# Patient Record
Sex: Female | Born: 1985 | Race: White | Hispanic: No | Marital: Married | Smoking: Never smoker
Health system: Southern US, Community
[De-identification: ages and names within clinical notes are randomized; demographics above are authoritative.]

## PROBLEM LIST (undated history)

## (undated) ENCOUNTER — Ambulatory Visit: Admission: EM | Payer: PRIVATE HEALTH INSURANCE | Source: Home / Self Care

## (undated) DIAGNOSIS — E669 Obesity, unspecified: Secondary | ICD-10-CM

## (undated) DIAGNOSIS — N939 Abnormal uterine and vaginal bleeding, unspecified: Secondary | ICD-10-CM

## (undated) DIAGNOSIS — R000 Tachycardia, unspecified: Secondary | ICD-10-CM

## (undated) DIAGNOSIS — T8859XA Other complications of anesthesia, initial encounter: Secondary | ICD-10-CM

## (undated) DIAGNOSIS — Z975 Presence of (intrauterine) contraceptive device: Secondary | ICD-10-CM

## (undated) DIAGNOSIS — R079 Chest pain, unspecified: Secondary | ICD-10-CM

## (undated) DIAGNOSIS — T4145XA Adverse effect of unspecified anesthetic, initial encounter: Secondary | ICD-10-CM

## (undated) DIAGNOSIS — N6019 Diffuse cystic mastopathy of unspecified breast: Secondary | ICD-10-CM

## (undated) DIAGNOSIS — Z30017 Encounter for initial prescription of implantable subdermal contraceptive: Secondary | ICD-10-CM

## (undated) DIAGNOSIS — F32A Depression, unspecified: Secondary | ICD-10-CM

## (undated) DIAGNOSIS — I10 Essential (primary) hypertension: Secondary | ICD-10-CM

## (undated) DIAGNOSIS — E0590 Thyrotoxicosis, unspecified without thyrotoxic crisis or storm: Secondary | ICD-10-CM

## (undated) DIAGNOSIS — N390 Urinary tract infection, site not specified: Secondary | ICD-10-CM

## (undated) DIAGNOSIS — G932 Benign intracranial hypertension: Secondary | ICD-10-CM

## (undated) DIAGNOSIS — I499 Cardiac arrhythmia, unspecified: Secondary | ICD-10-CM

## (undated) DIAGNOSIS — Z8489 Family history of other specified conditions: Secondary | ICD-10-CM

## (undated) DIAGNOSIS — G43909 Migraine, unspecified, not intractable, without status migrainosus: Secondary | ICD-10-CM

## (undated) DIAGNOSIS — J302 Other seasonal allergic rhinitis: Secondary | ICD-10-CM

## (undated) DIAGNOSIS — F329 Major depressive disorder, single episode, unspecified: Secondary | ICD-10-CM

## (undated) HISTORY — DX: Encounter for initial prescription of implantable subdermal contraceptive: Z30.017

## (undated) HISTORY — DX: Abnormal uterine and vaginal bleeding, unspecified: N93.9

## (undated) HISTORY — DX: Benign intracranial hypertension: G93.2

## (undated) HISTORY — PX: TONSILLECTOMY: SUR1361

## (undated) HISTORY — PX: CHOLECYSTECTOMY: SHX55

## (undated) HISTORY — DX: Migraine, unspecified, not intractable, without status migrainosus: G43.909

## (undated) HISTORY — DX: Presence of (intrauterine) contraceptive device: Z97.5

---
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
## 2006-04-18 ENCOUNTER — Emergency Department (HOSPITAL_COMMUNITY): Admission: EM | Admit: 2006-04-18 | Discharge: 2006-04-18 | Payer: Self-pay | Admitting: Emergency Medicine

## 2006-09-19 ENCOUNTER — Inpatient Hospital Stay (HOSPITAL_COMMUNITY): Admission: AD | Admit: 2006-09-19 | Discharge: 2006-09-23 | Payer: Self-pay | Admitting: Obstetrics and Gynecology

## 2006-10-07 ENCOUNTER — Inpatient Hospital Stay (HOSPITAL_COMMUNITY): Admission: AD | Admit: 2006-10-07 | Discharge: 2006-10-07 | Payer: Self-pay | Admitting: Obstetrics and Gynecology

## 2006-10-07 ENCOUNTER — Ambulatory Visit: Payer: Self-pay | Admitting: Obstetrics and Gynecology

## 2006-10-20 ENCOUNTER — Observation Stay (HOSPITAL_COMMUNITY): Admission: AD | Admit: 2006-10-20 | Discharge: 2006-10-21 | Payer: Self-pay | Admitting: Obstetrics and Gynecology

## 2006-10-26 ENCOUNTER — Ambulatory Visit: Payer: Self-pay | Admitting: Obstetrics and Gynecology

## 2006-10-26 ENCOUNTER — Inpatient Hospital Stay (HOSPITAL_COMMUNITY): Admission: AD | Admit: 2006-10-26 | Discharge: 2006-10-26 | Payer: Self-pay | Admitting: Obstetrics and Gynecology

## 2006-10-27 ENCOUNTER — Ambulatory Visit: Payer: Self-pay | Admitting: Family Medicine

## 2006-10-27 ENCOUNTER — Inpatient Hospital Stay (HOSPITAL_COMMUNITY): Admission: AD | Admit: 2006-10-27 | Discharge: 2006-10-30 | Payer: Self-pay | Admitting: Obstetrics and Gynecology

## 2006-11-15 ENCOUNTER — Emergency Department (HOSPITAL_COMMUNITY): Admission: EM | Admit: 2006-11-15 | Discharge: 2006-11-15 | Payer: Self-pay | Admitting: Emergency Medicine

## 2006-11-16 ENCOUNTER — Ambulatory Visit: Payer: Self-pay | Admitting: Internal Medicine

## 2006-11-16 ENCOUNTER — Inpatient Hospital Stay (HOSPITAL_COMMUNITY): Admission: EM | Admit: 2006-11-16 | Discharge: 2006-11-21 | Payer: Self-pay | Admitting: Emergency Medicine

## 2006-11-20 ENCOUNTER — Encounter (INDEPENDENT_AMBULATORY_CARE_PROVIDER_SITE_OTHER): Payer: Self-pay | Admitting: General Surgery

## 2007-01-08 ENCOUNTER — Ambulatory Visit: Payer: Self-pay | Admitting: Family Medicine

## 2007-04-05 ENCOUNTER — Encounter: Payer: Self-pay | Admitting: Family Medicine

## 2007-04-18 ENCOUNTER — Emergency Department (HOSPITAL_COMMUNITY): Admission: EM | Admit: 2007-04-18 | Discharge: 2007-04-18 | Payer: Self-pay | Admitting: Emergency Medicine

## 2007-05-09 ENCOUNTER — Ambulatory Visit: Payer: Self-pay | Admitting: Family Medicine

## 2007-05-09 LAB — CONVERTED CEMR LAB
BUN: 14 mg/dL (ref 6–23)
Basophils Absolute: 0 10*3/uL (ref 0.0–0.1)
Basophils Relative: 0 % (ref 0–1)
Calcium: 10 mg/dL (ref 8.4–10.5)
Cholesterol: 130 mg/dL (ref 0–200)
Eosinophils Absolute: 0.1 10*3/uL (ref 0.0–0.7)
Eosinophils Relative: 2 % (ref 0–5)
Hemoglobin: 13.2 g/dL (ref 12.0–15.0)
LDL Cholesterol: 74 mg/dL (ref 0–99)
Monocytes Absolute: 0.5 10*3/uL (ref 0.1–1.0)
Neutro Abs: 4.2 10*3/uL (ref 1.7–7.7)
Neutrophils Relative %: 54 % (ref 43–77)
Platelets: 292 10*3/uL (ref 150–400)
TSH: 1.363 microintl units/mL (ref 0.350–5.50)
Total CHOL/HDL Ratio: 3

## 2007-05-23 ENCOUNTER — Ambulatory Visit (HOSPITAL_COMMUNITY): Admission: RE | Admit: 2007-05-23 | Discharge: 2007-05-23 | Payer: Self-pay | Admitting: Family Medicine

## 2007-08-05 ENCOUNTER — Emergency Department (HOSPITAL_COMMUNITY): Admission: EM | Admit: 2007-08-05 | Discharge: 2007-08-05 | Payer: Self-pay | Admitting: Emergency Medicine

## 2007-09-11 ENCOUNTER — Emergency Department (HOSPITAL_COMMUNITY): Admission: EM | Admit: 2007-09-11 | Discharge: 2007-09-11 | Payer: Self-pay | Admitting: Emergency Medicine

## 2007-10-30 ENCOUNTER — Telehealth: Payer: Self-pay | Admitting: Family Medicine

## 2007-10-31 DIAGNOSIS — F329 Major depressive disorder, single episode, unspecified: Secondary | ICD-10-CM

## 2007-10-31 DIAGNOSIS — E669 Obesity, unspecified: Secondary | ICD-10-CM

## 2007-10-31 DIAGNOSIS — N6019 Diffuse cystic mastopathy of unspecified breast: Secondary | ICD-10-CM

## 2007-10-31 DIAGNOSIS — J301 Allergic rhinitis due to pollen: Secondary | ICD-10-CM

## 2007-10-31 DIAGNOSIS — F3289 Other specified depressive episodes: Secondary | ICD-10-CM | POA: Insufficient documentation

## 2007-11-06 ENCOUNTER — Emergency Department (HOSPITAL_COMMUNITY): Admission: EM | Admit: 2007-11-06 | Discharge: 2007-11-06 | Payer: Self-pay | Admitting: Emergency Medicine

## 2007-11-13 ENCOUNTER — Telehealth: Payer: Self-pay | Admitting: Family Medicine

## 2007-12-02 ENCOUNTER — Emergency Department (HOSPITAL_COMMUNITY): Admission: EM | Admit: 2007-12-02 | Discharge: 2007-12-02 | Payer: Self-pay | Admitting: Emergency Medicine

## 2007-12-05 ENCOUNTER — Emergency Department (HOSPITAL_COMMUNITY): Admission: EM | Admit: 2007-12-05 | Discharge: 2007-12-05 | Payer: Self-pay | Admitting: Emergency Medicine

## 2007-12-26 IMAGING — US US ABDOMEN COMPLETE
1 series · 14 of 25 positions shown · non-contrast
Comparison: none

CLINICAL DATA: Acute pancreatitis.  Right upper quadrant pain.  Two weeks post partum.
 ABDOMEN ULTRASOUND:
TECHNIQUE: Complete abdominal ultrasound examination was performed including evaluation of the liver, gallbladder, bile ducts, pancreas, kidneys, spleen, IVC, and abdominal aorta.

[Series 1: us abdomen complete · 0.32mm/px · 14 of 70 slices shown]
[im 1/70]
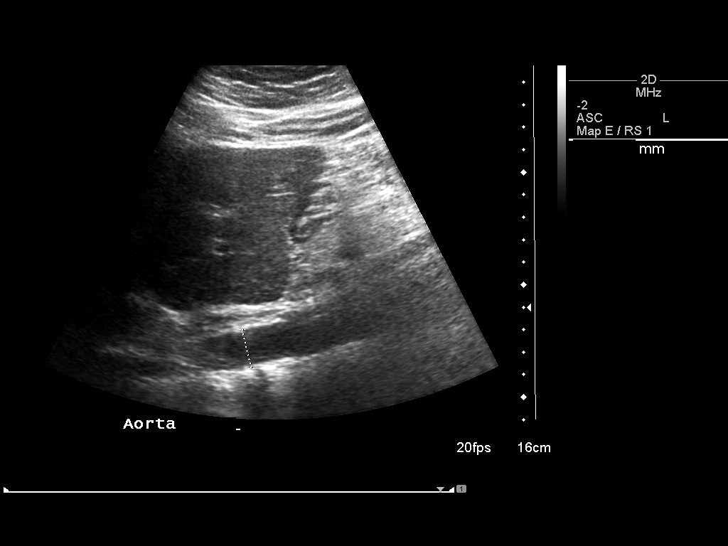
[im 6/70]
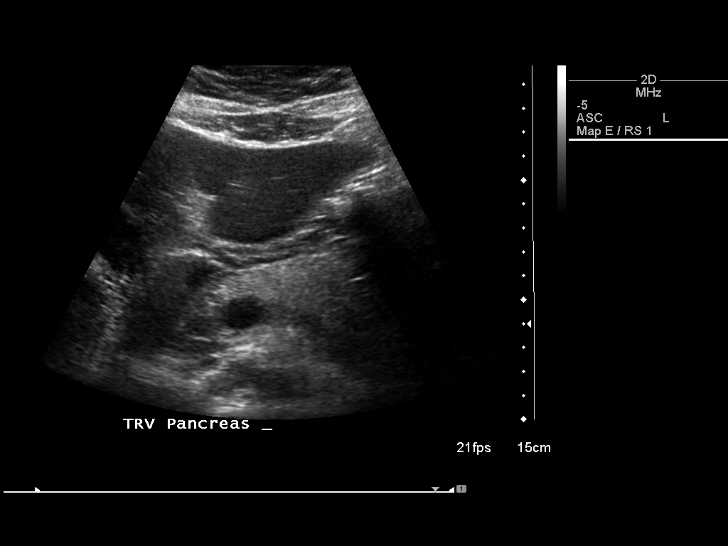
[im 12/70]
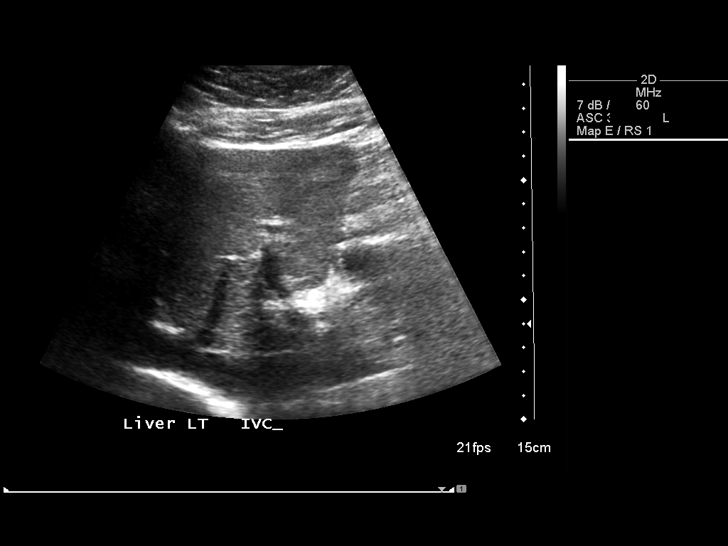
[im 18/70]
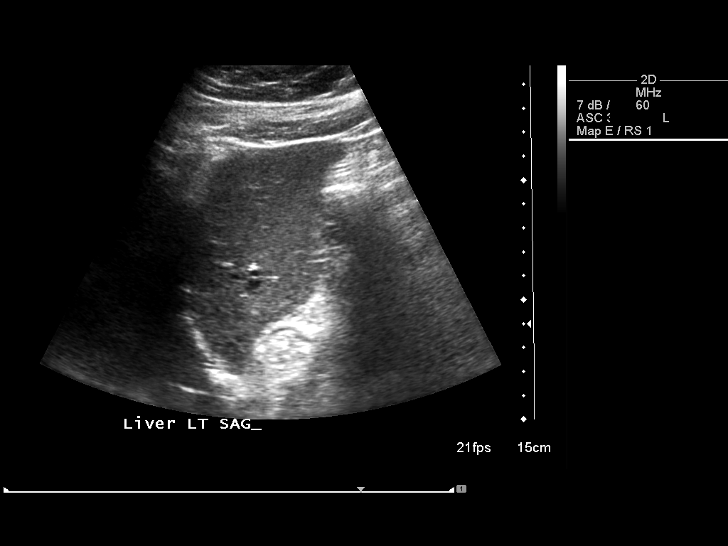
[im 24/70]
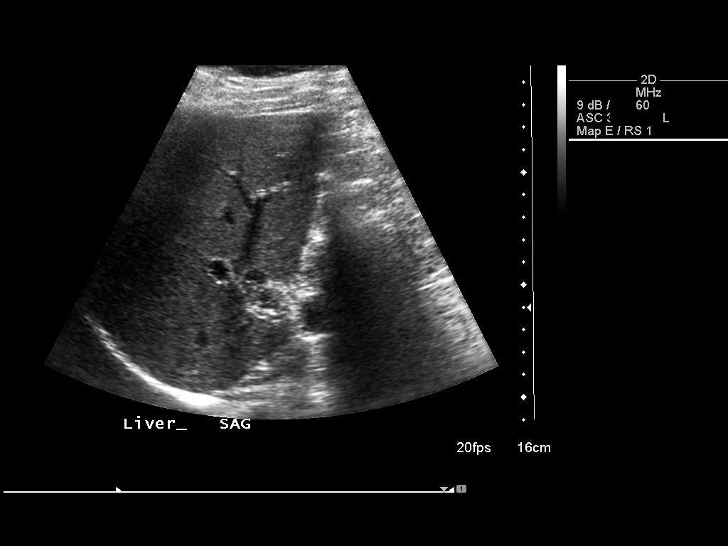
[im 26/70]
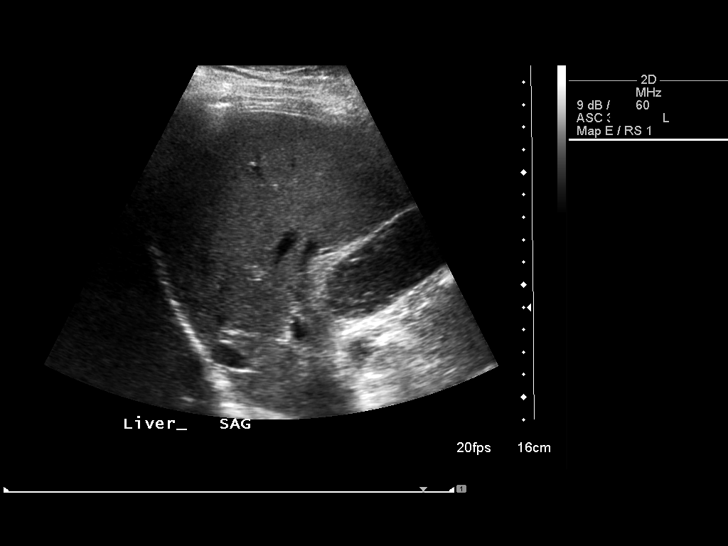
[im 32/70]
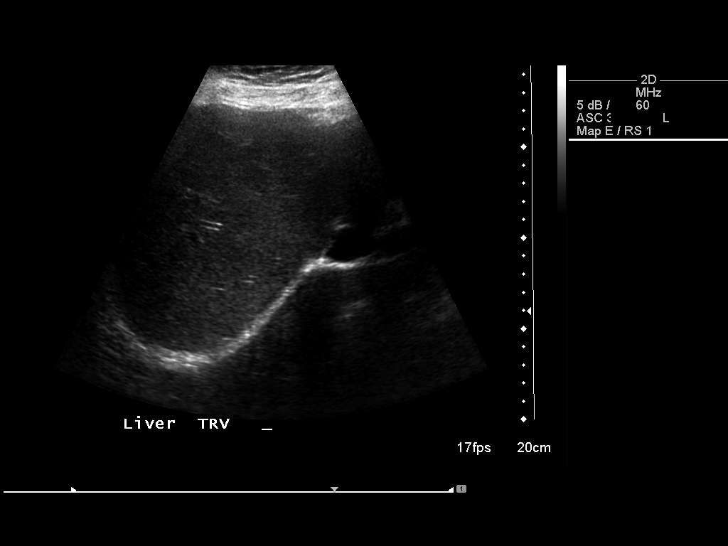
[im 38/70]
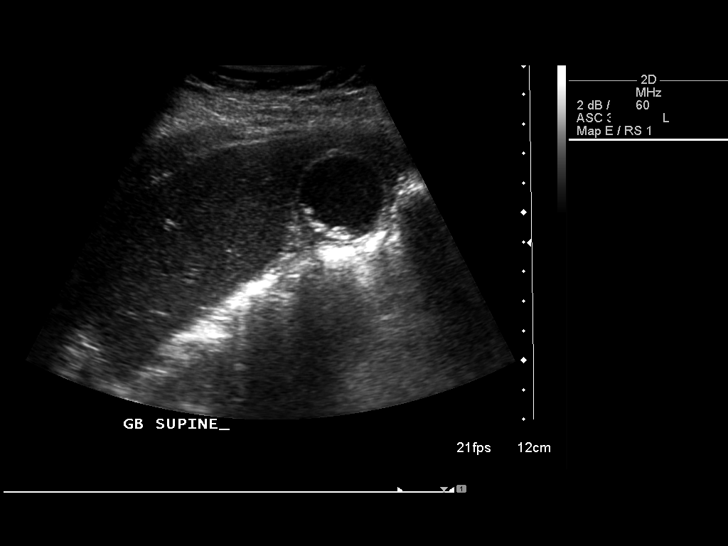
[im 44/70]
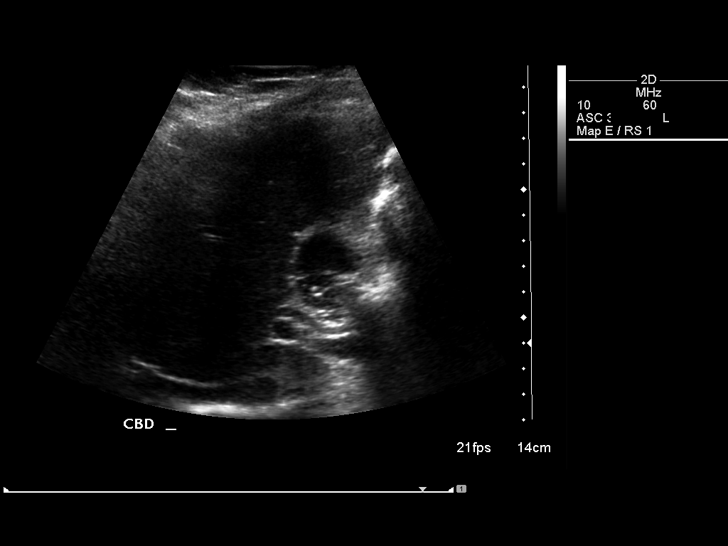
[im 47/70]
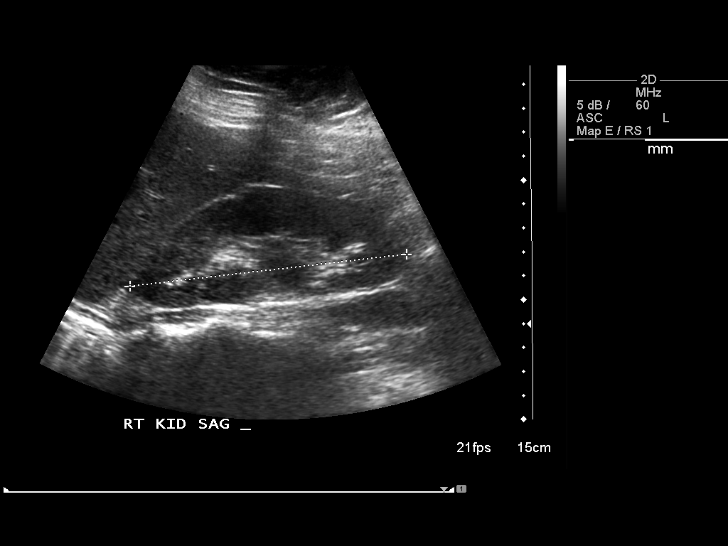
[im 52/70]
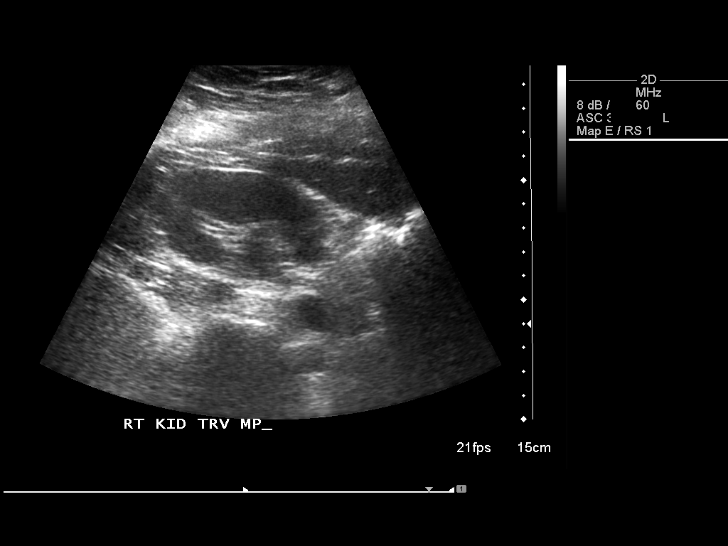
[im 58/70]
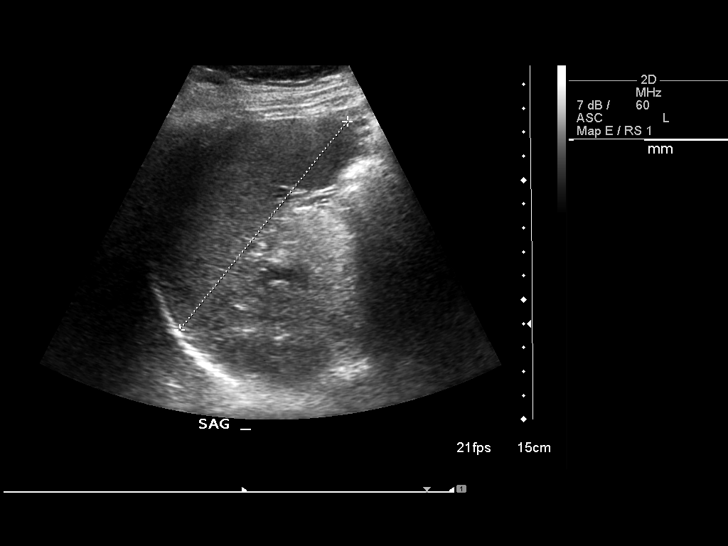
[im 64/70]
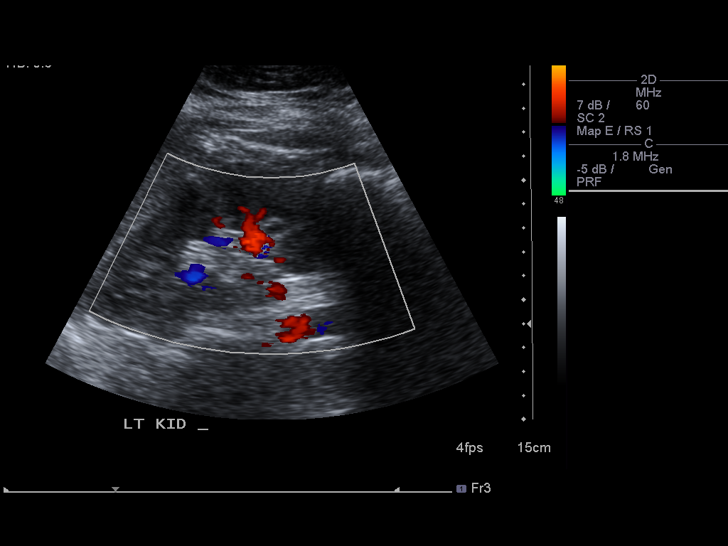
[im 70/70]
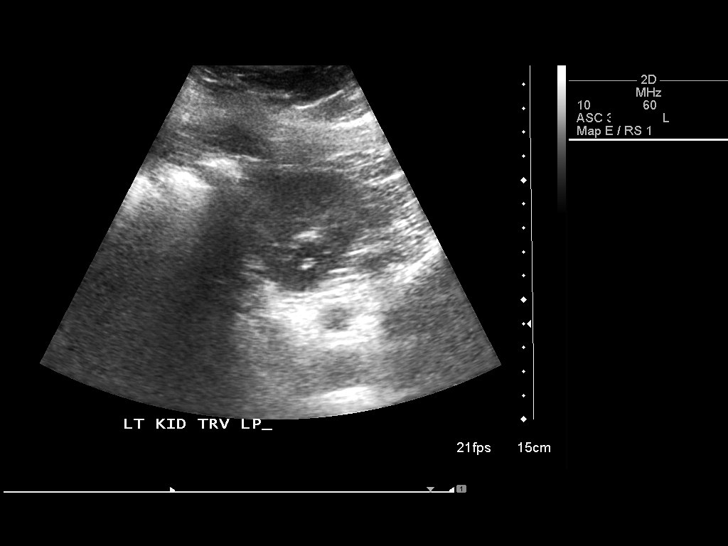

[14 of 25 positions shown; findings below may reference images not displayed]

FINDINGS: Multiple tiny shadowing gallstones are seen within the gallbladder lumen measuring less than 5 mm in size.  Echogenic sludge is also seen within the gallbladder.  There is no evidence of gallbladder wall thickening or pericholecystic fluid.  There is no evidence of biliary ductal dilatation with the common bile duct measuring 3 mm.  
 The liver is normal in parenchymal echogenicity and no focal liver lesions are identified.  The visualized portions of the IVC and pancreas are unremarkable in appearance.  No peripancreatic fluid collections are noted.  
 There is no evidence of splenomegaly.  Both kidneys are normal in size and appearance and there is no evidence of hydronephrosis.  There is no evidence of abdominal aortic aneurysm.
IMPRESSION: Cholelithiasis.  No evidence of acute cholecystitis or biliary dilatation.

## 2008-03-15 ENCOUNTER — Emergency Department (HOSPITAL_COMMUNITY): Admission: EM | Admit: 2008-03-15 | Discharge: 2008-03-15 | Payer: Self-pay | Admitting: Emergency Medicine

## 2008-03-16 ENCOUNTER — Telehealth: Payer: Self-pay | Admitting: Family Medicine

## 2008-05-23 ENCOUNTER — Telehealth: Payer: Self-pay | Admitting: Family Medicine

## 2008-06-24 ENCOUNTER — Emergency Department (HOSPITAL_COMMUNITY): Admission: EM | Admit: 2008-06-24 | Discharge: 2008-06-24 | Payer: Self-pay | Admitting: Emergency Medicine

## 2008-09-02 ENCOUNTER — Emergency Department (HOSPITAL_COMMUNITY): Admission: EM | Admit: 2008-09-02 | Discharge: 2008-09-02 | Payer: Self-pay | Admitting: Emergency Medicine

## 2008-11-06 ENCOUNTER — Emergency Department (HOSPITAL_COMMUNITY): Admission: EM | Admit: 2008-11-06 | Discharge: 2008-11-07 | Payer: Self-pay | Admitting: Emergency Medicine

## 2008-11-06 ENCOUNTER — Emergency Department (HOSPITAL_COMMUNITY): Admission: EM | Admit: 2008-11-06 | Discharge: 2008-11-06 | Payer: Self-pay | Admitting: Emergency Medicine

## 2008-12-21 ENCOUNTER — Emergency Department (HOSPITAL_COMMUNITY): Admission: EM | Admit: 2008-12-21 | Discharge: 2008-12-21 | Payer: Self-pay | Admitting: Emergency Medicine

## 2009-04-26 ENCOUNTER — Emergency Department (HOSPITAL_COMMUNITY): Admission: EM | Admit: 2009-04-26 | Discharge: 2009-04-26 | Payer: Self-pay | Admitting: Emergency Medicine

## 2009-05-27 ENCOUNTER — Emergency Department (HOSPITAL_COMMUNITY): Admission: EM | Admit: 2009-05-27 | Discharge: 2009-05-28 | Payer: Self-pay | Admitting: Emergency Medicine

## 2009-05-30 ENCOUNTER — Emergency Department (HOSPITAL_COMMUNITY): Admission: EM | Admit: 2009-05-30 | Discharge: 2009-05-30 | Payer: Self-pay | Admitting: Emergency Medicine

## 2009-06-05 ENCOUNTER — Ambulatory Visit (HOSPITAL_COMMUNITY): Admission: RE | Admit: 2009-06-05 | Discharge: 2009-06-05 | Payer: Self-pay | Admitting: Emergency Medicine

## 2009-07-20 ENCOUNTER — Emergency Department (HOSPITAL_COMMUNITY): Admission: EM | Admit: 2009-07-20 | Discharge: 2009-07-20 | Payer: Self-pay | Admitting: Emergency Medicine

## 2009-08-22 ENCOUNTER — Emergency Department (HOSPITAL_COMMUNITY): Admission: EM | Admit: 2009-08-22 | Discharge: 2009-08-22 | Payer: Self-pay | Admitting: Emergency Medicine

## 2009-09-15 ENCOUNTER — Emergency Department (HOSPITAL_COMMUNITY): Admission: EM | Admit: 2009-09-15 | Discharge: 2009-09-15 | Payer: Self-pay | Admitting: Emergency Medicine

## 2009-09-18 ENCOUNTER — Emergency Department (HOSPITAL_COMMUNITY): Admission: EM | Admit: 2009-09-18 | Discharge: 2009-09-18 | Payer: Self-pay | Admitting: Emergency Medicine

## 2010-02-06 ENCOUNTER — Emergency Department (HOSPITAL_COMMUNITY): Admission: EM | Admit: 2010-02-06 | Discharge: 2010-02-06 | Payer: Self-pay | Admitting: Emergency Medicine

## 2010-03-18 ENCOUNTER — Telehealth (INDEPENDENT_AMBULATORY_CARE_PROVIDER_SITE_OTHER): Payer: Self-pay | Admitting: *Deleted

## 2010-04-09 ENCOUNTER — Emergency Department (HOSPITAL_COMMUNITY)
Admission: EM | Admit: 2010-04-09 | Discharge: 2010-04-09 | Payer: Self-pay | Source: Home / Self Care | Admitting: Emergency Medicine

## 2010-04-24 ENCOUNTER — Encounter: Payer: Self-pay | Admitting: Internal Medicine

## 2010-04-25 ENCOUNTER — Encounter: Payer: Self-pay | Admitting: Emergency Medicine

## 2010-04-25 ENCOUNTER — Encounter: Payer: Self-pay | Admitting: Internal Medicine

## 2010-05-04 NOTE — Letter (Signed)
Summary: RPC chart  RPC chart   Imported By: Curtis Sites 11/10/2009 10:55:30  _____________________________________________________________________  External Attachment:    Type:   Image     Comment:   External Document

## 2010-05-06 NOTE — Progress Notes (Signed)
Summary: hasn't been seen in 2 years  Phone Note Call from Patient   Summary of Call: patient called in this afternoon and stated that she had called this morning trying to make an appt.  she had to hang up b/c she was at work.  She said that who she spoke with kept putting her on hold.  I told her I would call her back and let her know what I found out after talking to [place].  I looked at her record and saw she had several no show's and she hadn't been here for 2 years.  After speaking to Joyce Gross she stated that Aurea Graff said the patient wouldn't be seen here.  I called the patient back to let her know that we weren't able to see her.  She understood, since it had been 2 years.  Work # [phone] or cell [phone] Initial call taken by: Curtis Sites,  March 18, 2010 3:25 PM

## 2010-06-23 LAB — URINALYSIS, ROUTINE W REFLEX MICROSCOPIC
Bilirubin Urine: NEGATIVE
Glucose, UA: NEGATIVE mg/dL
Protein, ur: NEGATIVE mg/dL
Specific Gravity, Urine: <1.005 — ABNORMAL LOW (ref 1.005–1.030)
Urobilinogen, UA: 0.2 mg/dL (ref 0.0–1.0)
pH: 6.5 (ref 5.0–8.0)

## 2010-06-23 LAB — WET PREP, GENITAL
Clue Cells Wet Prep HPF POC: NONE SEEN
Yeast Wet Prep HPF POC: NONE SEEN

## 2010-06-23 LAB — CBC
HCT: 42 % (ref 36.0–46.0)
Hemoglobin: 14.6 g/dL (ref 12.0–15.0)
MCHC: 34.7 g/dL (ref 30.0–36.0)
RBC: 4.62 MIL/uL (ref 3.87–5.11)

## 2010-06-23 LAB — URINE MICROSCOPIC-ADD ON

## 2010-07-10 LAB — BASIC METABOLIC PANEL
Creatinine, Ser: 0.83 mg/dL (ref 0.4–1.2)
GFR calc Af Amer: >60 mL/min (ref >60)
Potassium: 3.9 mEq/L (ref 3.5–5.1)
Sodium: 138 mEq/L (ref 135–145)

## 2010-07-10 LAB — URINALYSIS, ROUTINE W REFLEX MICROSCOPIC
Glucose, UA: 500 mg/dL — AB
Protein, ur: NEGATIVE mg/dL
Urobilinogen, UA: 0.2 mg/dL (ref 0.0–1.0)

## 2010-07-10 LAB — PREGNANCY, URINE: Preg Test, Ur: NEGATIVE

## 2010-07-10 LAB — DIFFERENTIAL
Basophils Absolute: 0 10*3/uL (ref 0.0–0.1)
Basophils Relative: 0 % (ref 0–1)
Lymphs Abs: 0.9 10*3/uL (ref 0.7–4.0)
Monocytes Absolute: 0.1 10*3/uL (ref 0.1–1.0)
Neutro Abs: 9.3 10*3/uL — ABNORMAL HIGH (ref 1.7–7.7)

## 2010-07-10 LAB — CBC
HCT: 42.8 % (ref 36.0–46.0)
MCV: 89.8 fL (ref 78.0–100.0)
Platelets: 282 10*3/uL (ref 150–400)
RBC: 4.77 MIL/uL (ref 3.87–5.11)
RDW: 12.7 % (ref 11.5–15.5)
WBC: 10.3 10*3/uL (ref 4.0–10.5)

## 2010-08-17 NOTE — Consult Note (Signed)
NAMEJERINE, Rhodes             ACCOUNT NO.:  [id number]   MEDICAL RECORD NO.:  [id number]          PATIENT TYPE:  INP   LOCATION:  A336                          FACILITY:  [facility]   PHYSICIAN:  R. Roetta Sessions, M.D. DATE OF BIRTH:  22-Sep-1985   DATE OF CONSULTATION:  11/16/2006  DATE OF DISCHARGE:                                 CONSULTATION   REASON FOR CONSULTATION:  Pancreatitis and abdominal pain.   HISTORY OF PRESENT ILLNESS:  [name] is a 25 year old Caucasian female  who is 3 weeks postpartum of a healthy but preterm girl who presented  with an acute onset of epigastric/right upper quadrant abdominal pain  associated with nausea.  She states the symptoms came on abruptly  yesterday.  Her abdominal pain became quite severe, and she came to the  emergency department.  She had a CT of the abdomen and pelvis there  which showed pancreatitis, primarily at the head of the pancreas.  There  was minimal gallbladder wall thickening.  Her amylase was 555.  Her  lipase was 264.  She did not have LFTs upon admission, but today her  LFTs were done.  Her total bilirubin was 3, alkaline phosphatase 223,  AST 264, and ALT 226.  She denies any alcohol use.  Her only medication  is Zoloft which she has been on chronically.  She does not have any  history of hyperlipidemia or hypercalcemia.  She denies any  constipation, diarrhea, melena, or rectal bleeding.  She does have  chronic GERD for which she usually takes Nexium but has not been on for  awhile.   MEDICATIONS AT HOME:  Zoloft daily.   ALLERGIES:  Dilaudid caused itching.  Just noted this admission.   PAST MEDICAL HISTORY:  1. Hypertension.  2. Gastroesophageal reflux disease.  3. Depression with previous psych admission for suicidal attempt.  4. Postpartum vaginal delivery on October 28, 2006, of a healthy but      preterm baby girl.   PAST SURGICAL HISTORY:  Tonsillectomy.   FAMILY HISTORY:  No known GI illnesses or colorectal  cancer.   SOCIAL HISTORY:  She is single.  She is unemployed.  She has a 3-week-  old daughter.  She is a nonsmoker.  No alcohol use.  No drug use.   REVIEW OF SYSTEMS:  GI:  See HPI.  CARDIOPULMONARY:  No chest pain or  shortness of breath.  GENITOURINARY:  No dysuria.   PHYSICAL EXAMINATION:  VITAL SIGNS:  Temp 97.7, pulse 66, respirations  18, blood pressure 112/71.  GENERAL:  A pleasant, obese, Caucasian female in no acute distress.  SKIN:  Warm and dry.  No jaundice.  HEENT:  The sclerae are nonicteric.  The oropharyngeal mucosa is moist  and pink.  No lymphadenopathy.  CHEST:  The lungs are clear to auscultation.  CARDIAC:  Regular rate and rhythm.  No murmurs, rubs, or gallops.  ABDOMEN:  Positive bowel sounds.  The abdomen is soft.  She has moderate  tenderness in the epigastrium and right upper quadrant region to deep  palpation.  No rebound tenderness or guarding.  No hepatosplenomegaly or  masses noted.  EXTREMITIES:  No edema.   LABS:  White count 8000, hemoglobin 11.5, hematocrit 34.5, platelets  339,000.  Sodium 138, potassium 4.2, BUN 6, creatinine 0.74, calcium  8.9.  INR is 1.0.  Albumin 3.1, total bilirubin 3, alkaline phosphatase  223.  AST 264, ALT 226.  Lipase this morning 192.  Amylase this a.m.  364.   IMPRESSION:  The patient is a 25 year old Caucasian female with  suspected biliary pancreatitis.  She is scheduled for an abdominal  ultrasound today, and hopefully this will tell us whether or not she has  choledocholithiasis at this point.  We will continue to monitor her  liver function tests.  If she has a common bile duct stone, she will  likely need endoscopic retrograde cholangiopancreatography followed by a  cholecystectomy.  If there is a suggestion that the stone has passed,  she may have a cholecystectomy with possible intraoperative  cholangiogram.   PLAN:  1. Follow up abdominal ultrasound as results are available this      morning.  2.  Continue supportive measures including n.p.o. status.  3. Lipase and LFTs in the morning.   We would like to thank the [facility] team for allowing [place] to take part  in the care of this patient.      [name], P.AJonathon Bellows, M.D.  Electronically Signed    LL/MEDQ  D:  11/16/2006  T:  11/16/2006  Job:  [id number]   cc:   [facility]

## 2010-08-17 NOTE — Consult Note (Signed)
[name], [name]             ACCOUNT NO.:  [id number]   MEDICAL RECORD NO.:  [id number]          PATIENT TYPE:  INP   LOCATION:  A336                          FACILITY:  [facility]   PHYSICIAN:  Barbaraann Barthel, M.D. DATE OF BIRTH:  01-21-1986   DATE OF CONSULTATION:  11/18/2006  DATE OF DISCHARGE:                                 CONSULTATION   Surgery was asked to see this 25 year old white female for gallstone  pancreatitis.   CHIEF COMPLAINT:  Right upper quadrant pain with nausea.   HISTORY OF PRESENT MEDICAL ILLNESS:  The patient has had long-time right  upper quadrant dyspepsia-type complaints which were worse during her  pregnancy.  She is now 3 weeks postpartum, and she was admitted through  the emergency room with gallstone pancreatitis.  Since her admission,  her pancreatic enzymes liver function studies have had a downward trend.  Her bilirubin is now within normal limits, and she obviously has passed  a gallbladder stone.  Surgery was consulted for cholecystectomy.  Sonogram shows multiple gallstones, and CT scan showed some inflammation  about the head of the pancreas upon her admission.  Clinically, her pain  has resolved. Her nausea and vomiting have resolved, and she feels much  better, and we will plan for surgery during this admission.   PHYSICAL EXAMINATION:  Rest of physical examination as follows.  VITAL SIGNS:  She is 5 feet 3 inches, weighs 220 pounds.  Temperature is  97.0, heart rate 60 per minute, respirations 18 per minute, blood  pressure 150/63, O2 saturation on room air 99%.  HEENT:  Head is normocephalic.  Eyes:  Extraocular movements are intact.  Pupils were round and react to light and accommodation.  There is no  conjunctival pallor or scleral injection.  Sclerae is a normal tincture.  Nose and oral mucosa are moist.  The patient has some facial acne.  NECK:  Supple and cylindrical without jugular vein distension,  thyromegaly, tracheal deviation  or cervical adenopathy.  No bruits are  auscultated.  CHEST:  Clear both to anterior and posterior auscultation.  HEART:  Regular rhythm.  BREASTS:  Are engorged and in the postpartum state.  The patient has  chosen not to breast feed. Axilla without masses.  ABDOMEN:  Soft. There is no guarding and no masses.  No femoral or  inguinal hernias are appreciated.  PELVIC:  Examination is deferred.  The patient continues to do some  spotting postpartum.  RECTAL:  Examination was deferred.  EXTREMITIES:  Within normal limits.  No edema.  No varicosities.   REVIEW OF SYSTEMS:  OB/GYN System:  The patient is 3 weeks postpartum.  This is her first child. No family history of breast carcinoma.  She is  a gravida 1, para 1, abortus zero, cesarean zero female who is 3 weeks  postpartum.  ENDOCRINE SYSTEM:  No history of diabetes or thyroid  disease.  CARDIORESPIRATORY SYSTEM:  She is a nondrinker, nonsmoker. No  shortness of breath.  No chest pain.  She had hypertension that was  related to her pregnancy.  She is on no medications for  hypertension at  present.  GU SYSTEM:  No history of dysuria or nephrolithiasis.  NEUROLOGIC SYSTEM:  The patient has a history of anxiety and, according  to her chart, she has been admitted with one suicide attempt in the  past.   ALLERGIES:  The patient has some allergies which were recently  discovered by taking DILAUDID.   MEDICATIONS:  She is on Rocephin now, and she is doing well. She is also  on Protonix.  She was morphine when she had problems with DILAUDID.   IMPRESSION:  [name] is a 25 year old, 3 weeks postpartum white  female who has gallstone pancreatitis which is subsiding.   PLAN:  1. Continue her on a clear liquid diet.  2. Continue antibiotics.  3. We will monitor her enzymes and liver function studies.  4. Plan for surgery during this admission.   I discussed surgery in detail with the family including complications  not limited to  but including bleeding, infection, damage to bile ducts,  perforation of organs, transitory diarrhea, and the possibility that  open surgery may be required.  Informed consent was obtained.      Barbaraann Barthel, M.D.  Electronically Signed     WB/MEDQ  D:  11/18/2006  T:  11/18/2006  Job:  [id number]   cc:   [facility]   Tilda Burrow, M.D.  Fax: [phone]

## 2010-08-17 NOTE — Consult Note (Signed)
[name], [name]             ACCOUNT NO.:  [id number]   MEDICAL RECORD NO.:  [id number]          PATIENT TYPE:  MAT   LOCATION:  MATC                          FACILITY:  [facility]   PHYSICIAN:  Tilda Burrow, M.D. DATE OF BIRTH:  [birth year]   DATE OF CONSULTATION:  DATE OF DISCHARGE:  10/26/2006                                 CONSULTATION   REASON FOR ADMISSION:  [name] is [redacted] weeks pregnant.  Came in with  complaints of questionable ruptured membranes.  Sterile speculum exam  revealed no pulling, Nitrazine equivocal, just a mucusy discharge with  some thin, white discharge there also.  Venetia Maxon is negative.  There are  clue cells.  Digital exam reveals the cervix 4 cm dilated, 100% effaced,  -1 station and a bulging bag of water which is essentially unchanged  from the exam that I performed on her a few days ago.   IMPRESSION:  Intrauterine pregnancy at 35 weeks.  Bacterial vaginosis.  No evidence of ruptured membrane.  The fetal heart is reactive and there  are no contractions.  She is being sent home with a prescription for  Flagyl and to follow up in our office as planned.      Jacklyn Shell, C.N.M.      Tilda Burrow, M.D.  Electronically Signed    FC/MEDQ  D:  10/26/2006  T:  10/27/2006  Job:  [id number]   cc:   [facility]

## 2010-08-17 NOTE — H&P (Signed)
[name], [name]             ACCOUNT NO.:  [id number]   MEDICAL RECORD NO.:  [id number]          PATIENT TYPE:  OIB   LOCATION:  LDR2                          FACILITY:  [facility]   PHYSICIAN:  Lazaro Arms, M.D.   DATE OF BIRTH:  [birth year]   DATE OF ADMISSION:  09/19/2006  DATE OF DISCHARGE:  LH                              HISTORY & PHYSICAL   CHIEF COMPLAINT:  Cramping for 4 to 5 hours and questionable ruptured  membranes.   HISTORY OF PRESENT ILLNESS:  [name] is a 25 year old gravida 1 para 0  with an EDC of December 03, 2006, which is based on last menstrual period  and correlating first and second trimester ultrasounds placing her at 72-  1/[redacted] weeks pregnant.  She began prenatal care in her first trimester and  has had regular visits since.  Her prenatal course has been complicated  by chronic hypertension for which she has been noncompliant with her  medications, stating that she took herself off of them a month or two  ago because her blood pressure was fine.  Prenatal labs include blood  type A negative, rubella is immune.  HBsAg, HIV, RPR, gonorrhea,  chlamydia are all negative.  Blood pressures have been 130s to 140s over  60s to 80s prenatally.  Weight is 222 pounds, which is not really a  weight gain.   PAST MEDICAL HISTORY:  Positive for hypertension and hives.   PAST SURGICAL HISTORY:  Tonsils and adenoids when she was 25 years old.   ALLERGIES:  No known drug allergies.   MEDICATIONS:  None at this time.   SOCIAL HISTORY:  Lives with fiance, his mother and family.  Denies  cigarette smoking, alcohol or drug use.   FAMILY HISTORY:  Positive for heart disease, hypertension, asthma and  diabetes.   PHYSICAL EXAMINATION:  HEENT:  Within normal limits.  Denies headache or  visual changes.  HEART:  Regular rate and rhythm.  LUNGS:  Clear.  ABDOMEN:  Soft and nontender.  She is having contractions every 5  minutes that she rates a 6/10.  CERVICAL:  Sterile  speculum exam reveals a clear mucusy discharge.  Nitrazine negative and Fern negative.  A few bacteria on the wet prep.  Digital examination reveals an outer os of 4 cm with an inner os of 2.  The length of the cervix is difficult to ascertain digitally due to the  difficulty in being able to palpate the side walls of the cervix.  EXTREMITIES:  Legs are trace edema.   IMPRESSION:  Intrauterine pregnancy at 29 weeks.  Pre-term labor.  No  rupture of membranes.   PLAN:  Plan of care was discussed with Dr. Despina Hidden.  We are waiting labs.  We have already started magnesium sulfate, betamethasone, ampicillin and  morphine all with the hopes of keeping her uterus quiet and preventing  further dilatation.  She did have a prolonged variable decel down to the  70s to 80s for 4 to 5 minutes which was documented fetal heart rate and  not maternal.  That did respond to maternal positioning.  Additionally,  her blood pressure is in the 150s to 90s range, so we will restart her  [place].      Jacklyn Shell, C.N.M.      Lazaro Arms, M.D.  Electronically Signed    FC/MEDQ  D:  09/19/2006  T:  09/19/2006  Job:  [id number]   cc:   [facility]  [address]  [address]  [place]  [place]

## 2010-08-17 NOTE — Consult Note (Signed)
NAMEKELSEY, [name]             ACCOUNT NO.:  [id number]   MEDICAL RECORD NO.:  [id number]          PATIENT TYPE:  INP   LOCATION:  9155                          [facility]ACILITY:  [facility]   PHYSICIAN:  Tilda Burrow, M.D. DATE OF BIRTH:  Jul 28, 1985   DATE OF CONSULTATION:  DATE OF DISCHARGE:  10/21/2006                                 CONSULTATION   The patient is being held for overnight and possibly longer observation  at [facility] for preterm labor at 44 and 4/7 weeks' gestation.  She was seen in the MAU around 7:30 p.m. with complaints of generalized  pain with intermittent contractions. She states she is unable to time  them. Her cervix is 3 to 4 cm, 75% effaced, -1 station. Please see  written H and P for further details.   At this point we will try IV fluids, sedation and Brethine to hopefully  stop her contractions.      Jacklyn Shell, C.N.M.      Tilda Burrow, M.D.  Electronically Signed    FC/MEDQ  D:  10/21/2006  T:  10/21/2006  Job:  [id number]

## 2010-08-17 NOTE — Group Therapy Note (Signed)
NAMECHAMARA, DYCK             ACCOUNT NO.:  [id number]   MEDICAL RECORD NO.:  [id number]          PATIENT TYPE:  INP   LOCATION:  A336                          FACILITY:  [facility]   PHYSICIAN:  Skeet Latch, DO    DATE OF BIRTH:  09/22/1985   DATE OF PROCEDURE:  DATE OF DISCHARGE:                                 PROGRESS NOTE   SUBJECTIVE:  [name] is a 25 year old Caucasian female who presented  to the emergency room with a one day history of severe abdominal pain.  The patient states that this is the first time that she ever had this  type of pain. It is located in her right upper quadrant. She stated that  it was severe and it radiated to her back. The patient admits to eating  a very large greasy meal and about 30 minutes to an hour after eating  she experienced these symptoms. The patient is about three weeks post-  partum for a natural delivery and is seen by Dr. Emelda Fear. The patient  has no previous medical problems. Her initial labs on admission had  shown an amylase of 555, and a lipase of 364. The patient has been on a  clear liquid diet and has been on IV fluids. Her amylase are lipase are  in normal range at this point. The patient did have a abdominal  ultrasound which showed cholelithiasis and surgery has been consulted.  At this time, the patient is feeling well and states that her right  upper quadrant pain has improved and the patient is stable.   OBJECTIVE:  VITAL SIGNS:  Temperature 97, pulse 60, respirations 18,  blood pressure 105/63. The patient is saturating 99% on room air.  CARDIOVASCULAR:  Regular rate and rhythm. No murmurs, rubs, or gallops.  LUNGS:  Clear to auscultation bilaterally. No rales, rhonchi, or  wheezing.  ABDOMEN:  Soft, obese. Some tenderness in the right upper quadrant. No  rigidity or guarding. Positive bowel sounds.  EXTREMITIES:  No cyanosis, clubbing, or edema.   LABORATORY DATA:  Lipase 39, amylase 94, total bilirubin 0.7,  direct  bilirubin 0.2, alkaline phosphatase 176, AST 49. ALT 100, total protein  5.9.   ASSESSMENT AND PLAN:  1. Biliary pancreatitis. The patient's amylase and lipase have      returned to normal limits. Gastroenterology has been following this      patient.  2. For acute cholelithiasis, surgery has been consulted and      recommended surgery on Monday. The patient is to maintain a clear      liquid diet. The patient has also been placed on Rocephin 1 gram IV      daily and this will be continued until surgery.   Lastly, the patient is stable at this time. The patient has not been  requesting a lot of pain medications, so the patient is stable. The  patient is instructed not to leave the floor as she will be receiving IV  pain medications.      Skeet Latch, DO  Electronically Signed     SM/MEDQ  D:  11/18/2006  T:  11/19/2006  Job:  [id number]

## 2010-08-17 NOTE — Discharge Summary (Signed)
[name], [name]             ACCOUNT NO.:  [id number]   MEDICAL RECORD NO.:  [id number]          PATIENT TYPE:  INP   LOCATION:  9315                          [facility]ACILITY:  [facility]   PHYSICIAN:  Lazaro Arms, M.D.   DATE OF BIRTH:  February 10, 1986   DATE OF ADMISSION:  10/27/2006  DATE OF DISCHARGE:                               DISCHARGE SUMMARY   DELIVERY NOTE:  [name] finally developed a good labor pattern and she  progressed rapidly after finally reaching 5 centimeters.  She was noted  to be completely dilated at +2 station with an urge to push.  After  about a 45-minute second stay, she had a spontaneous vaginal delivery of  a viable female infant at 45.  Mouth and nose were suctioned on the  perineum with a bulb syringe and the body delivered without difficulty.  The cord was doubly clamped and cut and transferred to the awaiting NICU  team.  Weight is 4 pounds 5 ounces.  Apgar's are pending although they  were most likely high and there was no resuscitation needed.  20 units  of pitocin, dilated in  1000 cc of lactated ringers, was infused rapidly  IV.  The placenta separated spontaneously and delivered by controlled  cord traction at 1959.  It was inspected and appears to be intact with a  three-vessel cord.  Estimated blood loss 200 cc.  The vagina was  inspected and no lacerations were found.      Jacklyn Shell, C.N.M.      Lazaro Arms, M.D.  Electronically Signed    FC/MEDQ  D:  10/28/2006  T:  10/28/2006  Job:  [id number]

## 2010-08-17 NOTE — H&P (Signed)
[name], [name]             ACCOUNT NO.:  [id number]   MEDICAL RECORD NO.:  [id number]          PATIENT TYPE:  INP   LOCATION:  9169                          FACILITY:  [facility]   PHYSICIAN:  Lazaro Arms, M.D.   DATE OF BIRTH:  1986-02-10   DATE OF ADMISSION:  10/27/2006  DATE OF DISCHARGE:                              HISTORY & PHYSICAL   HISTORY OF PRESENT ILLNESS:  [name] is a 25 year old white female,  gravida 1, para 0, estimated date of delivery of December 03, 2006 by 11  week, 5 day sonogram, as her best dating criteria, making her 34 weeks  and 5 days. The patient presented to labor and delivery complaining of  spontaneous range of motion at home at approximately 1600. The patient  was actually seen in the Maternity Admissions Unit last night,  complaining of lower abdominal and contractions and she was just having  sort of just constant pain. No uterine contractions were really seen.  Her cervix was 4 cm, 100, minus 1 station. Urinalysis was negative and  again, with no labor. No further management was indicated. She presented  and had a gush of fluid and this was running down her leg. It was  Nitrazine positive. We did a swab and a fern, which was positive.  Ultrasound reveals the presenting part to be vertex. Again, last night  she was 400 minus 1. A group B strep culture was done just now and she  started on Ampicillin 2 grams q.6. We will manage her expectantly. We  will not intervene at this point unless any other issues arise. She did  have betamethasone at approximately 31 weeks x2 doses. Otherwise,  pregnancy has been uncomplicated.   PAST MEDICAL HISTORY:  Significant for borderline hypertension and  allergic hives.   PAST SURGICAL HISTORY:  She had tonsils and adenoids and a broken toe  with pinning.   ALLERGIES:  NO KNOWN DRUG ALLERGIES.   MEDICATIONS:  Prenatal vitamins and Zoloft.   PAST OBSTETRICAL HISTORY:  She is nulliparous. She is A negative.  Antibody screen was negative. She has received RhoGAM. Rubella is  immune. Hepatitis B was negative. HIV was non-reactive. Serology was non-  reactive. Pap was normal Gonorrhea and Chlamydia were negative. AFP was  normal. Gonorrhea and Chlamydia were negative x2. Group B strep has not  been performed as of yet.   REVIEW OF SYSTEMS:  Otherwise negative.   PHYSICAL EXAMINATION:  HEENT:  Unremarkable.  NECK:  Thyroid normal.  LUNGS:  Clear.  CARDIOVASCULAR:  Regular rate and rhythm. No murmur, rub, or gallop.  BREAST:  Deferred.  ABDOMEN:  Fundal height last in the office was 36 cm.  PELVIC:  Not assessed but she is vertex by sonogram.  EXTREMITIES:  Warm with no edema.   IMPRESSION:  1. Intrauterine pregnancy at 34-5/[redacted] weeks gestation.  2. Premature rupture of membranes.  3. History of pre-term labor early in the pregnancy.   PLAN:  The patient had a group B strep done. She is started on  ampicillin and will undergo expectant management. At the present time,  she is not having any uterine activity. She understands the indication  for this management and plan and agrees and will proceed.      Lazaro Arms, M.D.  Electronically Signed     LHE/MEDQ  D:  10/27/2006  T:  10/27/2006  Job:  [id number]

## 2010-08-17 NOTE — H&P (Signed)
[name], [name]             ACCOUNT NO.:  [id number]   MEDICAL RECORD NO.:  [id number]          PATIENT TYPE:  INP   LOCATION:  A336                          FACILITY:  [facility]   PHYSICIAN:  Dorris Singh, DO    DATE OF BIRTH:  [birth year]   DATE OF ADMISSION:  11/15/2006  DATE OF DISCHARGE:  LH                              HISTORY & PHYSICAL   CHIEF COMPLAINT:  Abdominal pain.   HISTORY OF PRESENT ILLNESS:  The patient is a 25 year old Caucasian  female who presented to the emergency room with a 1-day history of  severe abdominal pain.  The patient said that the pain started today and  it was acute.  This is the first time she has ever had pain like this.  It was located in the right side of the abdomen stating that it was dull  but felt that it radiated to her back.  She also admits to eating a very  large meal that had a lot of grease in it and after about 30 minutes to  an hour after eating she started to experience these symptoms.  The  patient also admits to being postpartum.  She had a baby two weeks ago,  natural vaginal delivery, and was seen by Dr. Rayna Sexton associates.  Denies any complications with delivery.  She is a gravida 1, para 1 at  this point in time.  Explained to the patient that we would go ahead and  let Dr. Rayna Sexton office know that she has been admitted during  postpartum period as a courtesy.   PAST MEDICAL HISTORY:  Spontaneous vaginal delivery occurred on October 28, 2006.  The patient has a past medical history of hypertension.  She has  a family history of hypertension.  She is positive for tonsils and  adenoid removal when she was a child.  She admits to being a nonsmoker,  social drinker, and does not use any illicit drugs.   ALLERGIES:  At the time of admission she had no known drug allergies,  however, while she was in the ER and given Dilaudid, the patient  reported being pruritic right after the administration of Dilaudid.  So  now we  will say that she has an allergy to DILAUDID with reaction of  pruritus.   CURRENT MEDICATIONS:  The patient is not on any medications.   REVIEW OF SYSTEMS:  GENERAL:  The patient is positive for appetite  decrease, negative for fever and chills.  EYES:  She denies any  blurriness or discharge.  HEENT:  Denies any rhinorrhea, erythema of the  throat, or trouble hearing with her ears.  CARDIOVASCULAR:  Denies any  palpitations or chest pain.  RESPIRATORY:  Denies any wheezing or  shortness of breath.  GASTROINTESTINAL:  Positive for nausea and  abdominal pain in the right upper quadrant.  Negative for vomiting.  GENITOURINARY:  Negative for any kind of hesitancy or flank pain.  GYNECOLOGIC:  Positive for vaginal bleeding which is lochia which is  decreasing and positive for some vaginal pain.  MUSCULOSKELETAL:  Negative for arthralgias or any kind  of weakness.  SKIN:  Negative for  changes, positive for pruritus.  NEUROLOGY:  Negative for any seizures  or dizziness or weakness.  ALLERGIC:  Positive for rash to Dilaudid.   PHYSICAL EXAMINATION:  VITAL SIGNS:  Blood pressure 130/68, heart rate  80, respirations 18, temperature 97.4, saturating at 99% on room air.  GENERAL:  This is a 25 year old Caucasian female who is well-nourished,  well-developed, in no acute distress.  She is also obese.  She is 2  weeks postpartum.  SKIN:  Good turgor, good texture.  No tenting noted.  HEENT:  Head is normocephalic and atraumatic.  Eyes equal and reactive  to light bilaterally, no conjunctival injection or scleral icterus.  Ears are normal shape, no discharge noted.  Gross auditory acuity  intact.  Nose; symmetrical, no tenderness or discharge noted.  Throat;  hygiene is fair.  No dentures, no erythema or exudate.  NECK:  No masses.  Full range of motion.  No tracheal deviation.  HEART:  Regular rate and rhythm.  No rubs, gallops, or clicks.  LUNGS:  Clear to auscultation bilaterally.  No wheezes,  crackles, or  rhonchi.  ABDOMEN:  Round.  No scars.  Bowel movements x4.  Consistency is soft.  Some tenderness in the right upper quadrant.  No organomegaly and no CVA  tenderness noted.  MUSCULOSKELETAL:  No muscular atrophy or weakness.  Full range of motion  of all extremities.  No redness or tenderness noted.  VASCULAR:  Carotid, popliteal, posterior tibial and posterior pedalis  pulses are present.  LYMPHATIC:  No cervical, axillary, or inguinal adenopathy.  NEUROLOGY:  Cranial nerves II-XII grossly intact.  Strength is 5/5.  Reflexes 5/5.   LABORATORY DATA:  She had an amylase done that was 555 which was  elevated and also had a lipase done which was 364 which was elevated.  Her basic metabolic panel with a sodium of 139, potassium 3.7, chloride  107, carbon dioxide 24, glucose 105, BUN 8, creatinine 0.69.  CBC with  differential; white count is 6.7, hemoglobin 12.1, hematocrit 35.7,  platelet count 338.  She had a urine pregnancy test which was negative.  Urine was positive for hemoglobin and for a small amount of bilirubin  and for urobilirubin of 2.0.  This also might be because she is still 2  weeks postpartum.   Her CT that was done today showed inflammatory changes of the head of  the pancreas with minimal fluid along with post peritoneal fascia.  No  pseudocyst present.  Minimal wall thickening of the gallbladder, this  may be secondary to the pancreatitis.   ASSESSMENT:  1. Pancreatitis.  2. History of hypertension.   PLAN:  1. We will admit the patient to the service of [facility] with the      diagnosis of acute pancreatitis.  2. We will keep the patient on bed rest and keep her NPO with IV      fluids going at normal saline 100 mL per hour.  3. We will do GI prophylaxis of Protonix 40 mg IV as well as DVT      prophylaxis.  4. We will continue to keep the patient with pain management,      originally put the patient on Dilaudid, however, the patient       reported pruritus after the administration of Dilaudid, gave the      patient Benadryl and then switched her to morphine sulfate.  We  will continue to monitor.  5. We will repeat amylase, lipase, CBC, CMP in the morning.  6. We will get GI to consult regarding pancreatitis.  7. We will send out a courtesy call to Dr. Rayna Sexton office to let      the office know that she has been admitted for acute pancreatitis.      Just because she is within her 8 weeks postpartum period.  I will      also obtain an ultrasound      to determine if there are gallstones in the gallbladder and to see      if anything else needs to be considered at this point in time.  8. We will continue to monitor the patient closely and change      management should further indications be noted.      Dorris Singh, DO  Electronically Signed     CB/MEDQ  D:  11/16/2006  T:  11/16/2006  Job:  [id number]

## 2010-08-17 NOTE — Discharge Summary (Signed)
NAMEJONIYA, BOBERG             ACCOUNT NO.:  [id number]   MEDICAL RECORD NO.:  [id number]          PATIENT TYPE:  INP   LOCATION:  LDR2                          FACILITY:  [facility]   PHYSICIAN:  Tilda Burrow, M.D. DATE OF BIRTH:  October 01, 1985   DATE OF ADMISSION:  09/19/2006  DATE OF DISCHARGE:  06/21/2008LH                               DISCHARGE SUMMARY   CHIEF COMPLAINT:  Pregnancy at 29 weeks, preterm labor with no evidence  of membrane rupture.   DISCHARGE DIAGNOSES:  1. Pregnancy at 30 weeks' gestation, not delivered.  2. Fetal fibronectin-positive status.  3. Preterm labor, resolved.  4. Questionable history of hypertension.   PROCEDURES:  1. Magnesium sulfate tocolysis x48 hours.  2. Betamethasone therapy x2 doses.   DISCHARGE MEDICATIONS:  1. Brethine 5 mg p.o. q.6h. x1 week, refill x4.  2. Ambien 10 mg p.o. q.h.s.   FOLLOW-UP:  One week in our office.   Limit activity at home with light household ambulation and activities  around.   HOSPITAL SUMMARY:  This is a 25 year old female, gravida 1, para 0, due  August 31, who was admitted after being seen in the office complaining  of abdominal discomfort and blood pressure  __elevation__.  Prenatal  course had been notable for a history of chronic hypertension.  She has  been off blood pressure medicines the entire pregnancy according to the  patient's history and with normal blood pressures noted.  She was  admitted with initial exam showing a soft, multiparous-feeling cervix  with digital exam showing an outer os of 4 cm, inner os of 2 cm by  admitting examiner with difficulty assessing the cervix reported at that  exam.  She was admitted and placed on magnesium sulfate therapy and due  to an admitting blood pressure of 120/94 was placed on Aldomet 500 mg  b.i.d.  Initial assessment included liver function tests, which were  normal, albumin low at 2.5, normal platelets at 290,000, with urinalysis  showing  ketonuria but negative for protein and negative for evidence of  urinary tract infection.  Fetal fibronectin was obtained June 17 and  returned positive.  Group B strep was negative.  She was afebrile.  Fetal heart rate in the normal range.  The patient's course was notable  for remaining afebrile with occasional mild uterine irritability but no  real active contraction pattern.  Follow-up cervical exam on June 19  showed the cervix to be short, 1.4 cm, and closed with no funneling but  transvaginal ultrasound.  Digital exam shows the cervix to be short,  50%, -2, with relatively firm tissue.  She completed her betamethasone  x2 days, and 48 hours of antibiotics.  Group B strep returned negative.  She was kept an additional 48 hours on oral Brethine and remained in  stable condition and was discharged September 23, 2006, to follow up in our  office.  Note, blood pressures while in hospital on Aldomet were quite  normal by report, 120s over 60s to 80s.  She will therefore not be sent  home on any antihypertensive.  It should be noted  that should she have  blood pressure elevation in the future, Procardia may be likely be  selected.   Follow-up in one week at [facility].   ADDENDUM:  Admit FCD June 19, June 20 LHE, June 21 [facility].      Tilda Burrow, M.D.  Electronically Signed     JVF/MEDQ  D:  09/23/2006  T:  09/23/2006  Job:  [id number]

## 2010-08-17 NOTE — Op Note (Signed)
[name], [name]             ACCOUNT NO.:  [id number]   MEDICAL RECORD NO.:  [id number]          PATIENT TYPE:  INP   LOCATION:  A336                          FACILITY:  [facility]   PHYSICIAN:  Barbaraann Barthel, M.D. DATE OF BIRTH:  April 18, 1985   DATE OF PROCEDURE:  11/20/2006  DATE OF DISCHARGE:                               OPERATIVE REPORT   SURGEON:  Barbaraann Barthel, MD.   POSTOPERATIVE DIAGNOSES:  1. Gallstone pancreatitis.  2. Cholelithiasis.  3. Cholecystitis.   PROCEDURE:  Laparoscopic cholecystectomy.   SPECIMEN:  Gallbladder with stones.   NOTE:  This is a 25 year old white female who is approximately 3 weeks  postpartum, who presented with gallstone pancreatitis.  She was seen  initially by the GI service.  She had passed her common bile duct stone  and her liver function studies had a downward trend and her bilirubin  remained normal, and at the time of surgery her amylase and her  pancreatic enzymes were not elevated.  We discussed surgery with her,  discussing the possibility of laparoscopic cholecystectomy, discussing  complications not limited to but including bleeding, infection, damage  to bile ducts, perforation of organs, and transitory diarrhea.  We also  discussed the possibility that an open cholecystectomy may be necessary.  Informed consent was obtained.   GROSS OPERATIVE FINDINGS:  The patient had residual gallbladder  inflammation and edema.  Her cystic duct was slightly enlarged and there  was a stone within it, which we removed.  The right of the quadrant  otherwise appeared to be normal.  The gallbladder was removed without  complications from the liver bed.  It was slightly intrahepatic.  The  rest of the right upper quadrant was grossly within normal limits.   TECHNIQUE:  The patient was placed in a supine position and after the  adequate administration of general anesthesia via endotracheal  intubation, her entire abdomen was prepped with  Betadine solution and  draped in the usual manner.  Prior to this a Foley catheter was  aseptically inserted.  With the patient in Trendelenburg, an incision  was carried out over the superior aspect of the umbilicus.  The fascia  was dissected and elevated with a sharp towel clip and a Veress needle  was inserted and confirmed in position with a saline drop test.  We then  placed the 11-mm cannula through using the Visiport technique and then  under direct vision an 11-mm cannula was placed in the epigastrium and  two 5-mm cannulas in the right upper quadrant laterally.  The  gallbladder was grasped.  Its adhesions were taken down.  The cystic  duct was clearly visualized, triply silver clipped and divided, as was  the cystic artery.  The gallbladder was removed with minimal spillage  using the hook cautery device.  The gallbladder was then exited from the  abdomen using the Endosac device.  We then irrigated with normal saline  solution.  I elected to leave Surgicel within the liver bed as well as a  Jackson-Pratt drain.  We checked for hemostasis and then desufflated the  abdomen and then  closed the fascial incisions in the area of the  epigastrium and the umbilicus with 0 Polysorb suture.  All port sites  were infiltrated with 0.5% Sensorcaine to help with postoperative  comfort, and the  drain was sutured in place with 3-0 nylon.  The wounds were closed using  a stapling device.  Prior to closure all sponge, needle, instrument  counts were found to be correct.  Estimated blood loss was minimal.  The  patient received a liter of crystalloids intraoperatively.  There were  no complications.      Barbaraann Barthel, M.D.  Electronically Signed     WB/MEDQ  D:  11/20/2006  T:  11/21/2006  Job:  [id number]   cc:   Tilda Burrow, M.D.  Fax: [phone]   [facility]

## 2010-08-17 NOTE — Discharge Summary (Signed)
[name], [name]             ACCOUNT NO.:  [id number]   MEDICAL RECORD NO.:  [id number]          PATIENT TYPE:  INP   LOCATION:  A336                          FACILITY:  [facility]   PHYSICIAN:  Barbaraann Barthel, M.D. DATE OF BIRTH:  1986/01/19   DATE OF ADMISSION:  11/16/2006  DATE OF DISCHARGE:  08/19/2008LH                               DISCHARGE SUMMARY   PROCEDURE:  On November 20, 2006 laparoscopic cholecystectomy.   CONSULTATIONS:  1. General surgery service .  2. Dr. Emelda Fear.   HISTORY OF PRESENT ILLNESS:  This patient is a 25 year old white female  who is 3 weeks postpartum and who presented with abdominal pain and was  found to have gallstone pancreatitis.  She was admitted to the medical  service and antibiotics were initiated.  Sonogram revealed the presence  of cholelithiasis without obvious acute cholecystitis or biliary  dilatation CT scan done previously on her admission of November 16, 2006  showed inflammatory changes around the head of the pancreas with some  free fluid along the posterior peritoneal border compatible with acute  appendicitis and this showed some mild thickening of her gallbladder.  The patient was admitted with elevated liver function studies and  amylase. Her bilirubin on admission was 3.0 and her liver function  studies were all elevated.  Her amylase was in the 500s and her lipase  was 192. After surgery her liver function studies went on a downward  trend. On November 20, 2006, her bilirubin was 0.8 total and her lipase  and amylase were not elevated at 28 and 69 respectively.  Her AST was 49  down from 264.  Her ALT was 75 down from 226 and her alkaline  phosphatase was 139 down from 223.  We waited until clinically she  improved and her liver function studies had a downward trend suggesting  she had passed the gallstone causing her to have the pancreatitis. Then  we proceeded to laparoscopic cholecystectomy and this took place on  November 20, 2006 and this was uneventful.  On her postoperative day #1,  she was ambulating very well, she was tolerating p.o., she had no  dysuria, no leg pain, no shortness of breath, her abdomen was soft.  She  had minimal serosanguineous JP drainage and she was doing quite well and  requested discharge which clinically certainly was very feasible  according to her condition.   I did notice that she always had a rather flat affect. The nurses did  advise me that she had did not show very much binding activity with her  newborn and since she had a past history a suicide attempt at least by  medical record, I thought it was prudent to have Dr. Emelda Fear evaluate  her prior to discharge to see if he made needed to make a referral or  otherwise treat her for what might be incipient post partum depression.  At any rate, he will make that appropriate referral as I have discussed  this personally with him as well. So her discharge is awaiting his  evaluation.  Otherwise her laboratory data as mentioned above  and her  hospital course was uneventful after surgery and she did quite well and  she was discharged awaiting Dr. Rayna Sexton evaluation.  She is excused  from work, her diet is to be full liquid and soft diet.  She is told to  clean her wounds with alcohol 2 or 3 times a day as needed.  She is told  to increase her activity as tolerated.  She is permitted to walk  indoors, outdoors up and down the stairs.  She is told she is permitted  to shower.  No bathing, no tub baths or no Jacuzzi, no heavy lifting and  no driving, no sexual activity at present.  She is to follow-up with me  in one week's time to call the office for an appointment and she is  given my number. She is also told to go to the emergency room should  there be any acute changes and she is told follow-up with Dr. Emelda Fear  as is appropriate.   DISCHARGE MEDICATIONS:  Darvocet 1 tablet p.o. q.4 h p.r.n. pain. She is  to take Protonix as  needed also for her GERD-like symptoms.  We will  follow her perioperatively.   CONSULTATIONS:  1. Dr. Emelda Fear.  2. Dr. Malvin Johns of surgery service.  3. GI service.      Barbaraann Barthel, M.D.  Electronically Signed     WB/MEDQ  D:  11/21/2006  T:  11/22/2006  Job:  [id number]   cc:   Tilda Burrow, M.D.  Fax: [phone]

## 2010-08-20 NOTE — Discharge Summary (Signed)
[name], [name]             ACCOUNT NO.:  [id number]   MEDICAL RECORD NO.:  [id number]          PATIENT TYPE:  OBV   LOCATION:  9196                          FACILITY:  [facility]   PHYSICIAN:  Tilda Burrow, M.D. DATE OF BIRTH:  1985/09/08   DATE OF ADMISSION:  10/20/2006  DATE OF DISCHARGE:  10/21/2006                               DISCHARGE SUMMARY   ADMISSION DIAGNOSES:  1. Pregnancy [redacted] weeks gestation.  2. Preterm uterine irritability.  3. Advanced cervical favorability and dilation.   HISTORY OF PRESENT ILLNESS:  This 25 year old female gravida 1, para 0  with unknown LMP, with ultrasound __________ 12/03/2006.  She is  admitted for observation, was placed on observation status due to  uterine irritability with cervix 3-4 cm, 75%, minus 1.  She was admitted  and was anticipated that she would go into labor.  She was placed in  Labor and Delivery, monitored, given analgesics and proceeded through  the evening to make no change in her cervix contractions.  Pain began at  9/10.  The patient was concerned and evaluation proved negative.  Urinalysis and fetal monitoring showed fetal well-being.  She still had  the pain, which decreased in discomfort level.  The pain gradually  decreased through the night and resolved.  The patient was discharged  home 10/21/06 without evidence of labor, without delivering.      Tilda Burrow, M.D.  Electronically Signed     JVF/MEDQ  D:  11/28/2006  T:  11/29/2006  Job:  [id number]
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
## 2010-08-20 NOTE — Discharge Summary (Signed)
NAME:  [name], [name]                       ACCOUNT NO.:  [id number]   MEDICAL RECORD NO.:  [id number]                   PATIENT TYPE:  IPS   LOCATION:  0100                                 [facility]ACILITY:  [facility]   PHYSICIAN:  Cindie Crumbly, M.D.               DATE OF BIRTH:  [birth year]   DATE OF ADMISSION:  12/24/2001  DATE OF DISCHARGE:                                 DISCHARGE SUMMARY   REASON FOR ADMISSION:  This 25 year old white female was admitted  complaining of depression and suicidal ideation, with an attempt to kill  herself by cutting her throat with a knife.  For further history of present  illness, please see the patient's psychiatry admission assessment.   PHYSICAL EXAMINATION:  At the time of admission was significant for a  history of gastroesophageal reflux disease and she had an otherwise  unremarkable physical examination.   LABORATORY EXAMINATION:  The patient underwent a laboratory workup to rule  out any medical problems contributing to her symptomatology.  A urine probe  for gonorrhea and chlamydia were negative.  Urine pregnancy test was  negative.  A GGT was within normal limits.  Hepatic panel was within normal  limits.  Basic metabolic panel was within normal limits.  CBC was  unremarkable.  TSH and free Tr were within normal limits.  Urine drug screen  was negative.  Blood alcohol level was undectable.  A UA was unremarkable.  The patient received no x-rays, no special procedures, no additional  consultations.  She sustained no complications during the course of this  hospitalization.   HOSPITAL COURSE:  On admission, the patient was psychomotor agitated.  Affect and mood were depressed, irritable and angry.  Her concentration was  decreased, and she displayed poor impulse control.  She was discontinued  from trazodone and BuSpar and begun on a trial of Remeron SolTabs.  She was  continued on Zoloft and titrated upward to a therapeutic dose.  She took  Nexium on an outpatient basis for gastroesophageal reflux disease.  This was  changed to Protonix because of the lack of Nexium on this formulary.  The  patient tolerated these medications without side effects.  At the time of  discharge, she is actively participating in all aspects of the therapeutic  treatment program, denies any homicidal or suicidal ideation, no longer  appears to be a danger to herself or others, and is motivated for outpatient  therapy,  and consequently is felt to have reached her maximum benefits of  hospitalization and is ready for discharge to a less restricted alternative  setting.   CONDITION ON DISCHARGE:  Improved.   ADMISSION DIAGNOSES:   AXIS I:  1. Major depression, recurrent, severe without psychosis.  2. Rule out post-traumatic stress disorder.  3. Rule out schizoaffective disorder.   AXIS II:  1. Rule out personality disorder not otherwise specified.  2. Rule out learning disorder not otherwise  specified.   AXIS III:  1. Gastroesophageal reflux disease.  2. Obesity.   AXIS IV:  Current psychosocial stressors are severe.   AXIS V:  Code 20 on admission, code 30 on discharge.   FURTHER EVALUATION AND TREATMENT RECOMMENDATIONS:  1. The patient is discharged to home.  2. She is discharged on an unrestricted level of activity and a regular     diet.  3. She will follow up with her outpatient psychiatrist at [facility] for all further aspects of her psychiatric care and     consequently I will sign off on the case at this time.   DISCHARGE MEDICATIONS:  1. Nexium as prescribed by her primary care physician.  2. Remeron SolTabs 30 mg p.o. q.h.s.  3. Zoloft 100 mg p.o. q.d.                                               Cindie Crumbly, M.D.    TS/MEDQ  D:  01/01/2002  T:  01/01/2002  Job:  [id number]

## 2010-10-12 ENCOUNTER — Encounter: Payer: Self-pay | Admitting: *Deleted

## 2010-10-12 ENCOUNTER — Emergency Department (HOSPITAL_COMMUNITY)
Admission: EM | Admit: 2010-10-12 | Discharge: 2010-10-12 | Disposition: A | Discharge status: Home / Self Care | Payer: Self-pay | Attending: Emergency Medicine | Admitting: Emergency Medicine

## 2010-10-12 ENCOUNTER — Emergency Department (HOSPITAL_COMMUNITY): Payer: Self-pay

## 2010-10-12 DIAGNOSIS — W010XXA Fall on same level from slipping, tripping and stumbling without subsequent striking against object, initial encounter: Secondary | ICD-10-CM | POA: Insufficient documentation

## 2010-10-12 DIAGNOSIS — IMO0002 Reserved for concepts with insufficient information to code with codable children: Secondary | ICD-10-CM

## 2010-10-12 DIAGNOSIS — S93609A Unspecified sprain of unspecified foot, initial encounter: Secondary | ICD-10-CM | POA: Insufficient documentation

## 2010-10-12 HISTORY — DX: Essential (primary) hypertension: I10

## 2010-10-12 MED ORDER — HYDROCODONE-ACETAMINOPHEN 5-325 MG PO TABS
1.0000 | ORAL_TABLET | ORAL | Status: AC | PRN
Start: 2010-10-12 — End: 2010-10-22

## 2010-10-12 MED ORDER — IBUPROFEN 800 MG PO TABS: 800.0000 mg | ORAL_TABLET | Freq: Three times a day (TID) | ORAL | Status: AC

## 2010-10-12 NOTE — ED Notes (Signed)
C/o pain to top of left foot onset last night s/p fall

## 2010-10-12 NOTE — ED Provider Notes (Signed)
History     Chief Complaint  Patient presents with  . Foot Pain    c/o left foot pain s/p fall last night   HPI Comments: Patient states she tripped fell last evening and c/o persistent pain tot he top of her left foot.  Pain is worse with walking or standing.  She denies other injuries , numbness or weakness of the foot or ankle.    Patient is a 25 y.o. female presenting with lower extremity pain. The history is provided by the patient.  Foot Pain This is a new problem. The current episode started yesterday. The problem occurs constantly. The problem has been gradually worsening. Associated symptoms include arthralgias and myalgias. Pertinent negatives include no abdominal pain, joint swelling, neck pain, numbness or weakness. The symptoms are aggravated by standing and walking (movement and palpation). She has tried nothing for the symptoms. The treatment provided no relief.    Past Medical History  Diagnosis Date  . Hypertension     Past Surgical History  Procedure Date  . Cholecystectomy   . Tonsillectomy     History reviewed. No pertinent family history.  History  Substance Use Topics  . Smoking status: Never Smoker   . Smokeless tobacco: Not on file  . Alcohol Use: No    OB History    Grav Para Term Preterm Abortions TAB SAB Ect Mult Living                  Review of Systems  Constitutional: Negative.   HENT: Negative for neck pain.   Respiratory: Negative.   Cardiovascular: Negative.   Gastrointestinal: Negative for abdominal pain.  Musculoskeletal: Positive for myalgias and arthralgias. Negative for back pain, joint swelling and gait problem.  Skin: Negative.   Neurological: Negative for weakness and numbness.  Hematological: Does not bruise/bleed easily.    Physical Exam  BP 132/87  Pulse 102  Temp(Src) 98.9 F (37.2 C) (Oral)  Ht 5\' 3"  (1.6 m)  Wt 200 lb (90.719 kg)  BMI 35.43 kg/m2  SpO2 100%  Physical Exam  Constitutional: She is oriented to  person, place, and time. She appears well-developed and well-nourished.  HENT:  Head: Normocephalic and atraumatic.  Eyes: Pupils are equal, round, and reactive to light.  Neck: Normal range of motion. Neck supple.  Cardiovascular: Normal rate, regular rhythm and normal heart sounds.   Pulmonary/Chest: Effort normal and breath sounds normal.  Musculoskeletal: She exhibits tenderness. She exhibits no edema.       Left foot: She exhibits decreased range of motion and tenderness. She exhibits no swelling, normal capillary refill and no deformity.       Feet:       ttp of the dorsal left foot.  No edema , bruising or abrasions.  DP pulse intact.  Distal sensation is intact, CR<2 sec  Lymphadenopathy:    She has no cervical adenopathy.  Neurological: She is alert and oriented to person, place, and time. Coordination normal.  Skin: Skin is warm and dry. No erythema.    ED Course  Procedures  MDM   PAtient is ambulatory.  No focal neuro defictis, sensation intact.  I have reviewed the x-ray results and advised pt of the results.  She agrees to f/u with ortho if the pain is not improving      Lazariah Savard L. [place], [place] 10/12/10 2058

## 2010-10-12 NOTE — ED Notes (Signed)
Pt states that she slipped yesterday while wearing socks on her hardwood floors, pt to top of left foot, bruising noted, cms intact

## 2010-10-18 NOTE — ED Provider Notes (Signed)
Medical screening examination/treatment/procedure(s) were performed by non-physician practitioner and as supervising physician I was immediately available for consultation/collaboration.  Joya Gaskins, MD 10/18/10 [phone]

## 2010-12-31 LAB — URINALYSIS, ROUTINE W REFLEX MICROSCOPIC
Bilirubin Urine: NEGATIVE
Hgb urine dipstick: NEGATIVE
Protein, ur: 100 — AB
Urobilinogen, UA: 0.2

## 2010-12-31 LAB — POCT I-STAT, CHEM 8
BUN: 14
Chloride: 109
Creatinine, Ser: 1
Glucose, Bld: 99
Potassium: 3.7

## 2011-01-05 LAB — URINALYSIS, ROUTINE W REFLEX MICROSCOPIC
Bilirubin Urine: NEGATIVE
Glucose, UA: NEGATIVE
Hgb urine dipstick: NEGATIVE
Ketones, ur: NEGATIVE
Nitrite: NEGATIVE
Protein, ur: NEGATIVE
Specific Gravity, Urine: 1.02
Urobilinogen, UA: 0.2
pH: 6.5

## 2011-01-14 LAB — HEPATIC FUNCTION PANEL
ALT: 100 — ABNORMAL HIGH
ALT: 148 — ABNORMAL HIGH
ALT: 75 — ABNORMAL HIGH
ALT: 89 — ABNORMAL HIGH
AST: 54 — ABNORMAL HIGH
AST: 68 — ABNORMAL HIGH
AST: 95 — ABNORMAL HIGH
Albumin: 2.9 — ABNORMAL LOW
Albumin: 2.9 — ABNORMAL LOW
Alkaline Phosphatase: 139 — ABNORMAL HIGH
Alkaline Phosphatase: 161 — ABNORMAL HIGH
Alkaline Phosphatase: 176 — ABNORMAL HIGH
Bilirubin, Direct: 0.1
Indirect Bilirubin: 0.6
Indirect Bilirubin: 0.7
Indirect Bilirubin: 0.8
Total Bilirubin: 0.7
Total Bilirubin: 0.8
Total Protein: 5.7 — ABNORMAL LOW
Total Protein: 5.9 — ABNORMAL LOW
Total Protein: 6

## 2011-01-14 LAB — BASIC METABOLIC PANEL
CO2: 29
Calcium: 8.8
Creatinine, Ser: 0.79
GFR calc Af Amer: >60
GFR calc Af Amer: >60
GFR calc non Af Amer: >60
GFR calc non Af Amer: >60
Glucose, Bld: 90
Glucose, Bld: 97
Potassium: 3.8
Sodium: 143

## 2011-01-14 LAB — CBC
HCT: 32.7 — ABNORMAL LOW
Hemoglobin: 10.9 — ABNORMAL LOW
MCHC: 33.9
RBC: 3.8 — ABNORMAL LOW
RBC: 3.84 — ABNORMAL LOW
RDW: 14.1 — ABNORMAL HIGH
RDW: 14.9 — ABNORMAL HIGH

## 2011-01-14 LAB — DIFFERENTIAL
Basophils Absolute: 0
Basophils Relative: 0
Eosinophils Relative: 9 — ABNORMAL HIGH
Lymphocytes Relative: 42
Lymphs Abs: 2.8
Monocytes Absolute: 0.6
Monocytes Relative: 9
Neutro Abs: 5.6
Neutrophils Relative %: 65

## 2011-01-14 LAB — LIPASE, BLOOD
Lipase: 28
Lipase: 39

## 2011-01-14 LAB — AMYLASE: Amylase: 173 — ABNORMAL HIGH

## 2011-01-17 LAB — URINE MICROSCOPIC-ADD ON: RBC / HPF: NONE SEEN

## 2011-01-17 LAB — URINALYSIS, ROUTINE W REFLEX MICROSCOPIC
Bilirubin Urine: NEGATIVE
Glucose, UA: NEGATIVE
Glucose, UA: NEGATIVE
Ketones, ur: NEGATIVE
Leukocytes, UA: NEGATIVE
Nitrite: NEGATIVE
Nitrite: NEGATIVE
Protein, ur: NEGATIVE
Protein, ur: NEGATIVE
Specific Gravity, Urine: 1.015
Urobilinogen, UA: 0.2
pH: 5.5
pH: 6.5
pH: 6.5

## 2011-01-17 LAB — COMPREHENSIVE METABOLIC PANEL
ALT: 226 — ABNORMAL HIGH
AST: 264 — ABNORMAL HIGH
Albumin: 3.1 — ABNORMAL LOW
Calcium: 8.9
GFR calc Af Amer: >60
Sodium: 138
Total Protein: 6.4

## 2011-01-17 LAB — CBC
HCT: 37
Hemoglobin: 10.4 — ABNORMAL LOW
MCHC: 33.3
MCHC: 33.8
MCV: 84.8
Platelets: 338
Platelets: 339
Platelets: 320
RBC: 4.02
RBC: 3.55 — ABNORMAL LOW
RDW: 14.3 — ABNORMAL HIGH
WBC: 6.7
WBC: 13 — ABNORMAL HIGH
WBC: 15 — ABNORMAL HIGH

## 2011-01-17 LAB — BASIC METABOLIC PANEL
BUN: 8
Chloride: 107
Creatinine, Ser: 0.69

## 2011-01-17 LAB — CULTURE, BETA STREP (GROUP B ONLY)

## 2011-01-17 LAB — DIFFERENTIAL
Basophils Absolute: 0
Basophils Relative: 0
Eosinophils Absolute: 0.2
Eosinophils Absolute: 0.2
Eosinophils Relative: 3
Lymphocytes Relative: 26
Lymphs Abs: 2.1
Lymphs Abs: 2.4
Monocytes Relative: 9
Neutrophils Relative %: 50

## 2011-01-17 LAB — AMYLASE: Amylase: 555 — ABNORMAL HIGH

## 2011-01-17 LAB — LIPASE, BLOOD: Lipase: 364 — ABNORMAL HIGH

## 2011-01-17 LAB — PROTIME-INR: Prothrombin Time: 13.6

## 2011-01-17 LAB — APTT: aPTT: 28

## 2011-01-18 LAB — URINE CULTURE
Colony Count: NO GROWTH
Culture: NO GROWTH

## 2011-01-18 LAB — URINALYSIS, ROUTINE W REFLEX MICROSCOPIC
Glucose, UA: NEGATIVE
Hgb urine dipstick: NEGATIVE
Protein, ur: NEGATIVE
Specific Gravity, Urine: 1.01
pH: 7

## 2011-01-18 LAB — URINE MICROSCOPIC-ADD ON

## 2011-01-19 LAB — HEPATIC FUNCTION PANEL
ALT: 12
AST: 18
Albumin: 2.2 — ABNORMAL LOW
Albumin: 2.5 — ABNORMAL LOW
Alkaline Phosphatase: 70
Indirect Bilirubin: 0.3
Total Bilirubin: 0.5
Total Bilirubin: 0.7
Total Protein: 5.2 — ABNORMAL LOW
Total Protein: 6

## 2011-01-19 LAB — URINALYSIS, ROUTINE W REFLEX MICROSCOPIC
Bilirubin Urine: NEGATIVE
Bilirubin Urine: NEGATIVE
Hgb urine dipstick: NEGATIVE
Hgb urine dipstick: NEGATIVE
Ketones, ur: NEGATIVE
Nitrite: NEGATIVE
Protein, ur: NEGATIVE
Specific Gravity, Urine: 1.02
Specific Gravity, Urine: >1.03 — ABNORMAL HIGH
Urobilinogen, UA: 0.2
Urobilinogen, UA: 2 — ABNORMAL HIGH
pH: 6.5

## 2011-01-19 LAB — CBC
HCT: 28.1 — ABNORMAL LOW
Hemoglobin: 12.6
MCHC: 34.8
MCHC: 35.4
MCV: 87.1
MCV: 87.3
Platelets: 225
Platelets: 278
RBC: 3.19 — ABNORMAL LOW
RBC: 4.09
RDW: 13.4
WBC: 12.4 — ABNORMAL HIGH
WBC: 17.4 — ABNORMAL HIGH

## 2011-01-19 LAB — ELECTROLYTE PANEL
CO2: 22
Chloride: 108
Sodium: 137

## 2011-01-19 LAB — DIFFERENTIAL
Basophils Absolute: 0
Basophils Relative: 0
Basophils Relative: 0
Eosinophils Absolute: 0
Eosinophils Absolute: 0.2
Eosinophils Relative: 0
Lymphocytes Relative: 11 — ABNORMAL LOW
Lymphs Abs: 2
Lymphs Abs: 2.5
Monocytes Absolute: 1.2 — ABNORMAL HIGH
Monocytes Relative: 10
Monocytes Relative: 13 — ABNORMAL HIGH
Monocytes Relative: 6
Neutro Abs: 13.3 — ABNORMAL HIGH
Neutro Abs: 8.5 — ABNORMAL HIGH
Neutrophils Relative %: 69
Neutrophils Relative %: 83 — ABNORMAL HIGH

## 2011-01-19 LAB — FETAL FIBRONECTIN: Fetal Fibronectin: POSITIVE

## 2011-01-19 LAB — MAGNESIUM
Magnesium: 1.7
Magnesium: 5.4 — ABNORMAL HIGH
Magnesium: 5.6 — ABNORMAL HIGH

## 2012-01-20 ENCOUNTER — Emergency Department (HOSPITAL_COMMUNITY)
Admission: EM | Admit: 2012-01-20 | Discharge: 2012-01-20 | Disposition: A | Discharge status: Home / Self Care | Payer: Self-pay | Attending: Emergency Medicine | Admitting: Emergency Medicine

## 2012-01-20 ENCOUNTER — Emergency Department (HOSPITAL_COMMUNITY): Payer: Self-pay

## 2012-01-20 ENCOUNTER — Encounter (HOSPITAL_COMMUNITY): Payer: Self-pay | Admitting: Emergency Medicine

## 2012-01-20 DIAGNOSIS — I10 Essential (primary) hypertension: Secondary | ICD-10-CM | POA: Insufficient documentation

## 2012-01-20 DIAGNOSIS — O26859 Spotting complicating pregnancy, unspecified trimester: Secondary | ICD-10-CM | POA: Insufficient documentation

## 2012-01-20 DIAGNOSIS — Z9089 Acquired absence of other organs: Secondary | ICD-10-CM | POA: Insufficient documentation

## 2012-01-20 DIAGNOSIS — O4690 Antepartum hemorrhage, unspecified, unspecified trimester: Secondary | ICD-10-CM

## 2012-01-20 DIAGNOSIS — O26899 Other specified pregnancy related conditions, unspecified trimester: Secondary | ICD-10-CM

## 2012-01-20 LAB — URINALYSIS, ROUTINE W REFLEX MICROSCOPIC
Ketones, ur: NEGATIVE mg/dL
Leukocytes, UA: NEGATIVE
Protein, ur: NEGATIVE mg/dL
Urobilinogen, UA: 0.2 mg/dL (ref 0.0–1.0)

## 2012-01-20 LAB — CBC
Hemoglobin: 13.8 g/dL (ref 12.0–15.0)
MCH: 31 pg (ref 26.0–34.0)
MCHC: 34.2 g/dL (ref 30.0–36.0)
MCV: 90.6 fL (ref 78.0–100.0)

## 2012-01-20 LAB — ABO/RH: ABO/RH(D): A NEG

## 2012-01-20 LAB — WET PREP, GENITAL
Trich, Wet Prep: NONE SEEN
Yeast Wet Prep HPF POC: NONE SEEN

## 2012-01-20 LAB — PREGNANCY, URINE: Preg Test, Ur: POSITIVE — AB

## 2012-01-20 MED ORDER — RHO D IMMUNE GLOBULIN 1500 UNIT/2ML IJ SOLN
300.0000 ug | Freq: Once | INTRAMUSCULAR | Status: AC
  Administered 2012-01-20: 300 ug via INTRAMUSCULAR

## 2012-01-20 NOTE — ED Notes (Signed)
Pt denies any further vag bleeding or cramping. Understands need for f/u and will make appointment for next week

## 2012-01-20 NOTE — ED Provider Notes (Signed)
History     CSN: [id number]  Arrival date & time 01/20/12  1421   First MD Initiated Contact with Patient 01/20/12 1458      Chief Complaint  Patient presents with  . Vaginal Bleeding    HPI Pt was seen at 1515.  Per pt, c/o gradual onset and persistence of constant vaginal "spotting" that began today.  Pt states she took a home pregnancy test and also was eval at [facility] "told I was pregnant" this past week.  Pt hx G2P1, LMP 11/07/11 with EGA 10 4/7 weeks.  Pt denies vaginal discharge, no abd/pelvic pain, no back pain, no fevers, no N/V/D.    Past Medical History  Diagnosis Date  . Hypertension     Past Surgical History  Procedure Date  . Cholecystectomy   . Tonsillectomy     History  Substance Use Topics  . Smoking status: Never Smoker   . Smokeless tobacco: Not on file  . Alcohol Use: No    OB History    Grav Para Term Preterm Abortions TAB SAB Ect Mult Living   2 1  1      1       Review of Systems ROS: Statement: All systems negative except as marked or noted in the HPI; Constitutional: Negative for fever and chills. ; ; Eyes: Negative for eye pain, redness and discharge. ; ; ENMT: Negative for ear pain, hoarseness, nasal congestion, sinus pressure and sore throat. ; ; Cardiovascular: Negative for chest pain, palpitations, diaphoresis, dyspnea and peripheral edema. ; ; Respiratory: Negative for cough, wheezing and stridor. ; ; Gastrointestinal: Negative for nausea, vomiting, diarrhea, abdominal pain, blood in stool, hematemesis, jaundice and rectal bleeding. . ; ; Genitourinary: Negative for dysuria, flank pain and hematuria. ; ; GYN:  +vaginal bleeding, no vaginal discharge, no vulvar pain.;; Musculoskeletal: Negative for back pain and neck pain. Negative for swelling and trauma.; ;  Skin: Negative for pruritus, rash, abrasions, blisters, bruising and skin lesion.; ; Neuro: Negative for headache, lightheadedness and neck stiffness. Negative for weakness, altered  level of consciousness , altered mental status, extremity weakness, paresthesias, involuntary movement, seizure and syncope.       Allergies  Codeine and Dilaudid  Home Medications   Current Outpatient Rx  Name Route Sig Dispense Refill  . PRENATAL MULTIVITAMIN CH Oral Take 1 tablet by mouth daily.      BP 136/88  Pulse 105  Temp 98.3 F (36.8 C)  Resp 18  Ht 5\' 3"  (1.6 m)  Wt 240 lb (108.863 kg)  BMI 42.51 kg/m2  SpO2 100%  LMP 11/07/2011  Physical Exam 1520: Physical examination:  Nursing notes reviewed; Vital signs and O2 SAT reviewed;  Constitutional: Well developed, Well nourished, Well hydrated, In no acute distress; Head:  Normocephalic, atraumatic; Eyes: EOMI, PERRL, No scleral icterus; ENMT: Mouth and pharynx normal, Mucous membranes moist; Neck: Supple, Full range of motion, No lymphadenopathy; Cardiovascular: Regular rate and rhythm, No murmur, rub, or gallop; Respiratory: Breath sounds clear & equal bilaterally, No rales, rhonchi, wheezes.  Speaking full sentences with ease, Normal respiratory effort/excursion; Chest: Nontender, Movement normal; Abdomen: Soft, Nontender, Nondistended, Normal bowel sounds; Genitourinary: No CVA tenderness; Pelvic exam performed with permission of pt and female ED RN assist during exam.  External genitalia w/o lesions. Vaginal vault without discharge, no red blood in vault.  Cervix w/o lesions, not friable, GC/chlam and wet prep obtained and sent to lab.  Cervical os closed, no active bleeding. Bimanual exam w/o  CMT, uterine or adnexal tenderness.;; Extremities: Pulses normal, No tenderness, No edema, No calf edema or asymmetry.; Neuro: AA&Ox3, Major CN grossly intact.  Speech clear. No gross focal motor or sensory deficits in extremities.; Skin: Color normal, Warm, Dry.   ED Course  Procedures    MDM  MDM Reviewed: nursing note and vitals Interpretation: labs and ultrasound     Results for orders placed during the hospital  encounter of 01/20/12  URINALYSIS, ROUTINE W REFLEX MICROSCOPIC      Component Value Range   Color, Urine YELLOW  YELLOW   APPearance CLEAR  CLEAR   Specific Gravity, Urine 1.020  1.005 - 1.030   pH 6.5  5.0 - 8.0   Glucose, UA NEGATIVE  NEGATIVE mg/dL   Hgb urine dipstick NEGATIVE  NEGATIVE   Bilirubin Urine NEGATIVE  NEGATIVE   Ketones, ur NEGATIVE  NEGATIVE mg/dL   Protein, ur NEGATIVE  NEGATIVE mg/dL   Urobilinogen, UA 0.2  0.0 - 1.0 mg/dL   Nitrite NEGATIVE  NEGATIVE   Leukocytes, UA NEGATIVE  NEGATIVE  PREGNANCY, URINE      Component Value Range   Preg Test, Ur POSITIVE (*) NEGATIVE  WET PREP, GENITAL      Component Value Range   Yeast Wet Prep HPF POC NONE SEEN  NONE SEEN   Trich, Wet Prep NONE SEEN  NONE SEEN   Clue Cells Wet Prep HPF POC NONE SEEN  NONE SEEN   WBC, Wet Prep HPF POC FEW (*) NONE SEEN  CBC      Component Value Range   WBC 8.7  4.0 - 10.5 K/uL   RBC 4.45  3.87 - 5.11 MIL/uL   Hemoglobin 13.8  12.0 - 15.0 g/dL   HCT 40.9  81.1 - 91.4 %   MCV 90.6  78.0 - 100.0 fL   MCH 31.0  26.0 - 34.0 pg   MCHC 34.2  30.0 - 36.0 g/dL   RDW 78.2  95.6 - 21.3 %   Platelets 370  150 - 400 K/uL  ABO/RH      Component Value Range   ABO/RH(D) A NEG    HCG, QUANTITATIVE, PREGNANCY      Component Value Range   hCG, Beta Chain, Quant, S 573 (*) <5 mIU/mL    US Ob Comp Less 14 Wks 01/20/2012  *RADIOLOGY REPORT*  Clinical Data: Vaginal bleeding, clinical gestational age of [redacted] weeks and 4 days.  OBSTETRIC <14 WK ULTRASOUND, TRANSVAGINAL OB US  Technique:  Transabdominal and transvaginal ultrasound was performed for evaluation of the gestation as well as the maternal uterus and adnexal regions.  Findings:  There is a very small intra uterine fluid collection with surrounding decidual reaction.  This measures approximately 2 mm.  No yolk sac or embryo.  Maternal uterus/adnexae:  There is no evidence for subchorionic hemorrhage.  The ovaries appear normal.  No free fluid.   IMPRESSION:  1.  Small fluid collection within the intrauterine cavity which may represent early gestational sac.  This measures approximately 2 mm. Advise serial follow-up imaging in 7-10 days as well as serial quantitative beta HCG. 2.  No yolk sac or embryo identified at this time.   Original Report Authenticated By: Rosealee Albee, M.D.      [phone]:  Will dose Rhogam due to hx of vaginal bleeding and RH negative status.  Pt to f/u with her OB in 2 days for re-check of BHCG quant.  Dx and testing d/w pt and family.  Questions answered.  Verb understanding, agreeable to d/c home with outpt f/u.         Laray Anger, DO 01/23/12 1136

## 2012-01-20 NOTE — ED Notes (Signed)
Pt approximately 3-[redacted] weeks pregnant. Pt c/o light pink spotting today.

## 2012-01-20 NOTE — Discharge Instructions (Signed)
RESOURCE GUIDE  Chronic Pain Problems: Contact [facility]  [phone] Patients need to be referred by their primary care doctor.  Insufficient Money for Medicine: Contact United Way:  call "211" or [facility] [phone].  No Primary Care Doctor: - Call Health Connect  [phone] - can help you locate a primary care doctor that  accepts your insurance, provides certain services, etc. - Physician Referral Service- [phone]  Agencies that provide inexpensive medical care: - [facility]  [phone] - [facility]  [phone] - [facility]  [phone] - Women's Clinic  [phone] - Planned Parenthood  [phone] Haynes Bast[facility]  [phone]  Medicaid-accepting [facility] Providers: - [facility]- [address], [address]  [phone], Mon-Fri 9am-7pm, Sat 9am-1pm - [facility]- [address], [address] [place]  [phone] - [facility]- [address], [address] [place]  [phone] [facility]- [facility]- [address] [place] [address]  [phone] - Renaye RakersVeita Bland- [address], [address], [phone]  Only accepts [place] patients after they have their name  applied to their card  Self Pay (no insurance) in [place]: - Sickle Cell Patients: Dr Willey BladeEric Dean, [facility]  [address], [phone] - [facility]- [address]  [phone]       Redge Gainer-     [facility] [place]- [address], [address]       -     [facility]- see information above (Speak to CitigroupPam H if you do not have insurance)       -  [facility]- [address], [phone]       -  [facility] [place]- [address],  [phone]       -  [facility]- [address] [place] [address], [phone]       -  Dr Julio Sickssei-Bonsu-  [address], [address], [place], [phone]       -  [facility]- [address], [phone]       -  [facility] - [address] [place] [address], [phone], also [address], [phone]       -    [facility]- [address], [phone], 1st & 3rd Saturday   every month, 10am-1pm  1) Find a Doctor and Pay Out of Pocket Although you won't have to find out who is covered by your insurance plan, it is a good idea to ask around and get recommendations. You will then need to call the office and see if the doctor you have chosen will accept you as a new patient and what types of options they offer for patients who are self-pay. Some doctors offer discounts or will set up payment plans for their patients who do not have insurance, but you will need to ask so you aren't surprised when you get to your appointment.  2) Contact Your Local Health Department Not all health departments have doctors that can see patients for sick visits, but many do, so it is worth a call to see if yours does. If you don't know where your local health department is, you can check in your phone book. The CDC also has a tool to help you locate your state's health department, and many state websites also have  listings of all of their local health departments.  3) Find a Walk-in Clinic If your illness is not likely to be very severe or complicated, you may want to try a walk in clinic. These are popping up all over the country in pharmacies, drugstores, and shopping centers. They're usually staffed by nurse practitioners or physician assistants that have been trained to treat common illnesses and complaints. They're usually fairly quick and inexpensive. However, if you have serious medical issues or chronic medical problems, these are probably not your best option  STD Testing - [facility] Department of [facility] Luis Llorons Torres, STD Clinic, [address] [place], phone [phone] or [phone].  Monday - Friday, call for an appointment. [facility]  Department of [employer], STD Clinic, [address], [place], phone [phone] or [phone].  Monday - Friday, call for an appointment.  Abuse/Neglect: [facility] Child Abuse Hotline (908) 293-8840 [facility] Child Abuse Hotline (414) 337-1068 (After Hours)  Emergency Shelter:  [facility] [phone]) [phone]  Maternity Homes: - [facility] at the [place] [facility] [phone]) [phone] - [facility] [phone]  MRSA Hotline #:   (508)367-7619  [facility] Resources  [facility]  United [facility] Dept. [address]                 [address]         [address] [place] [address]  Blondell Reveal Phone:  [phone]                                  Phone:  [phone]                   Phone:  [phone]  [facility], [phone] - [phone]       -     [facility] in [place], [address],                                  [phone], [facility] Child Abuse Hotline (812)289-2172 or [phone] (After Hours)   Behavioral Health Services  Substance Abuse Resources: - [facility]  [phone] - [facility] [phone] - [facility] [place] [phone] Floydene Flock [phone] - Residential & Outpatient Substance Abuse Program  [phone]  Psychological Services: [facility]  [phone] Services  [phone] - [facility], [phone] [place]. [address], [place], ACCESS LINE: 9311756726 or [phone], [employer]an.[email or web address]  Dental Assistance  If unable to pay or uninsured, contact:  [facility] or [facility]. to become qualified for the adult dental  clinic.  Patients with Medicaid: [facility] [phone] [address], [phone] [address] [phone]  If unable  to pay, or uninsured, contact [facility] (406)[phone]) or [facility] Department [phone] in [place], [phone] in [facility]) to become qualified for the adult dental clinic  Other Low-Cost Community Dental Services: - [facility]- [address] [place], [place], [place], [address], [phone], Ext. [phone], 2nd and 4th Thursday of the month at 6:30am.  10 clients each day by appointment, can sometimes see walk-in patients if someone does not show for an appointment. [facility]- [address] Ether [place]ins [place], [place], [address], [phone] - [facility]- [address], [place], [place], [address], [phone] [facility] Health Department- [phone] [facility] Health Department- [phone] [facility] [phone]     Your ultrasound today showed a small fluid collection in your uterus, which was not definitive for a gestational sac.  This may be because of an early pregnancy (too early to see by ultrasound) or because of a threatened miscarriage.  You will need a repeat of your "quantitative HCG" (blood test) in 48 hours to help make this determination.  Your quantitative HCG was 573 today. Avoid strenuous activity and do NOT place anything into your vagina, ie: no douching, no tampons, no sexual intercourse, no swimming or tub baths, until you are seen in follow up by your regular OB/GYN doctor.  Call your regular OB/GYN doctor tomorrow morning to schedule a follow up appointment on Sundy to recheck your quantitative HCG blood test.  Return to the Emergency Department immediately if worsening.

## 2012-01-21 LAB — RH IG WORKUP (INCLUDES ABO/RH)
Antibody Screen: NEGATIVE
Unit division: 0

## 2012-01-21 LAB — GC/CHLAMYDIA PROBE AMP, GENITAL: GC Probe Amp, Genital: NEGATIVE

## 2012-01-22 LAB — URINE CULTURE: Colony Count: 4000

## 2012-03-26 LAB — OB RESULTS CONSOLE RPR: RPR: NONREACTIVE

## 2012-03-26 LAB — OB RESULTS CONSOLE HGB/HCT, BLOOD
HCT: 38 %
Hemoglobin: 12.1 g/dL

## 2012-03-26 LAB — OB RESULTS CONSOLE PLATELET COUNT: Platelets: 361 10*3/uL

## 2012-03-30 ENCOUNTER — Other Ambulatory Visit (HOSPITAL_COMMUNITY)
Admission: RE | Admit: 2012-03-30 | Discharge: 2012-03-30 | Disposition: A | Discharge status: Home / Self Care | Payer: BC Managed Care – PPO | Source: Ambulatory Visit | Attending: Obstetrics and Gynecology | Admitting: Obstetrics and Gynecology

## 2012-03-30 ENCOUNTER — Other Ambulatory Visit: Payer: Self-pay | Admitting: Family Medicine

## 2012-03-30 DIAGNOSIS — Z01419 Encounter for gynecological examination (general) (routine) without abnormal findings: Secondary | ICD-10-CM | POA: Insufficient documentation

## 2012-03-30 DIAGNOSIS — Z113 Encounter for screening for infections with a predominantly sexual mode of transmission: Secondary | ICD-10-CM | POA: Insufficient documentation

## 2012-03-30 LAB — OB RESULTS CONSOLE GC/CHLAMYDIA
Chlamydia: NEGATIVE
Gonorrhea: NEGATIVE

## 2012-04-04 NOTE — L&D Delivery Note (Signed)
Delivery Note At 5:41 PM a viable female was delivered via Vaginal, Spontaneous Delivery (Presentation: Right Occiput Anterior), delivered through loose nuchal.  APGAR: 8, 9; weight: not available at time of note.   Infant w/ spontaneous respiration and cry placed on maternal abdomen, cord clamped x 2 and cut by fob once quit pulsing, then to NICU team for assessment.  Placenta status: Intact, Spontaneous, 3 small infarcts noted.  Cord: 3 vessels with the following complications: .  Cord pH: not done    NICU in attendance of birth d/t prematurity, assessed baby, then allowed mom to hold briefly before taking to NICU  Anesthesia: Epidural  Episiotomy: None Lacerations: None Suture Repair: n/a Est. Blood Loss (mL): 300  Mom to women's unit.  Baby to NICU d/t prematurity- stable. Placenta to pathology.  Plans on pumping/breastfeeding, nexplanon for contraception  Marge Duncans 10/06/2012, 6:10 PM

## 2012-04-04 NOTE — L&D Delivery Note (Signed)
Attestation of Attending Supervision of Advanced Practitioner (CNM/NP): Evaluation and management procedures were performed by the Advanced Practitioner under my supervision and collaboration. I have reviewed the Advanced Practitioner's note and chart, and I agree with the management and plan.  Charizma Gardiner H. 1:29 PM   

## 2012-05-19 ENCOUNTER — Encounter: Payer: Self-pay | Admitting: Adult Health

## 2012-05-19 DIAGNOSIS — O2340 Unspecified infection of urinary tract in pregnancy, unspecified trimester: Secondary | ICD-10-CM

## 2012-05-19 DIAGNOSIS — O99619 Diseases of the digestive system complicating pregnancy, unspecified trimester: Secondary | ICD-10-CM

## 2012-05-19 DIAGNOSIS — K5900 Constipation, unspecified: Secondary | ICD-10-CM | POA: Insufficient documentation

## 2012-06-11 ENCOUNTER — Other Ambulatory Visit: Payer: Self-pay | Admitting: Obstetrics & Gynecology

## 2012-06-11 ENCOUNTER — Encounter: Payer: Self-pay | Admitting: *Deleted

## 2012-06-11 DIAGNOSIS — O09299 Supervision of pregnancy with other poor reproductive or obstetric history, unspecified trimester: Secondary | ICD-10-CM

## 2012-06-25 ENCOUNTER — Other Ambulatory Visit: Payer: Self-pay

## 2012-06-26 ENCOUNTER — Other Ambulatory Visit: Payer: Self-pay

## 2012-06-26 ENCOUNTER — Telehealth: Payer: Self-pay | Admitting: *Deleted

## 2012-06-26 ENCOUNTER — Encounter: Payer: Self-pay | Admitting: Obstetrics & Gynecology

## 2012-06-26 NOTE — Telephone Encounter (Signed)
Pt states light pressure lower abdomen, no bleeding, feeling baby "flutters", [redacted] weeks pregnant. Pt encouraged to push fluids and rest, pt to keep her appointment for this Thursday. Encouraged to call office back if she has vaginal bleeding. Pt verbalized understanding.

## 2012-06-28 ENCOUNTER — Ambulatory Visit (INDEPENDENT_AMBULATORY_CARE_PROVIDER_SITE_OTHER): Payer: BC Managed Care – PPO

## 2012-06-28 DIAGNOSIS — O09299 Supervision of pregnancy with other poor reproductive or obstetric history, unspecified trimester: Secondary | ICD-10-CM

## 2012-07-02 ENCOUNTER — Ambulatory Visit (INDEPENDENT_AMBULATORY_CARE_PROVIDER_SITE_OTHER): Payer: BC Managed Care – PPO | Admitting: Obstetrics & Gynecology

## 2012-07-02 ENCOUNTER — Encounter: Payer: Self-pay | Admitting: Obstetrics & Gynecology

## 2012-07-02 ENCOUNTER — Other Ambulatory Visit: Payer: Self-pay | Admitting: *Deleted

## 2012-07-02 VITALS — BP 120/80 | Wt 248.0 lb

## 2012-07-02 DIAGNOSIS — Z3482 Encounter for supervision of other normal pregnancy, second trimester: Secondary | ICD-10-CM

## 2012-07-02 DIAGNOSIS — Z3490 Encounter for supervision of normal pregnancy, unspecified, unspecified trimester: Secondary | ICD-10-CM

## 2012-07-02 DIAGNOSIS — O09212 Supervision of pregnancy with history of pre-term labor, second trimester: Secondary | ICD-10-CM

## 2012-07-02 DIAGNOSIS — O09219 Supervision of pregnancy with history of pre-term labor, unspecified trimester: Secondary | ICD-10-CM

## 2012-07-02 DIAGNOSIS — O09899 Supervision of other high risk pregnancies, unspecified trimester: Secondary | ICD-10-CM | POA: Insufficient documentation

## 2012-07-02 DIAGNOSIS — O09299 Supervision of pregnancy with other poor reproductive or obstetric history, unspecified trimester: Secondary | ICD-10-CM

## 2012-07-02 LAB — US OB DETAIL + 14 WK
Abdominal Circumference: 15.17 cm
Estimated Fetal Weight: 352 grams

## 2012-07-02 LAB — POCT URINALYSIS DIPSTICK
Glucose, UA: NEGATIVE
Nitrite, UA: NEGATIVE

## 2012-07-02 MED ORDER — HYDROXYPROGESTERONE CAPROATE 250 MG/ML IM OIL: 250.0000 mg | TOPICAL_OIL | Freq: Once | INTRAMUSCULAR | Status: DC

## 2012-07-02 NOTE — Progress Notes (Signed)
Patient reports good fetal movement, denies any bleeding and no rupture of membranes symptoms or contraction Discussed 17P with patient nd her need to reconsider.  Pt wants to begin injections.  Follow up for 1st shot next week. No other complaints or problems.

## 2012-07-02 NOTE — Patient Instructions (Signed)
Preventing Preterm Labor Preterm labor is when a pregnant woman has contractions that cause the cervix to open, shorten, and thin before 37 weeks of pregnancy. You will have regular contractions (tightening) 2 to 3 minutes apart. This usually causes discomfort or pain. HOME CARE  Eat a healthy diet.  Take your vitamins as told by your doctor.  Drink enough fluids to keep your pee (urine) clear or pale yellow every day.  Get rest and sleep.  Do not have sex if you are at high risk for preterm labor.  Follow your doctor's advice about activity, medicines, and tests.  Avoid stress.  Avoid hard labor or exercise that lasts for a long time.  Do not smoke. GET HELP RIGHT AWAY IF:   You are having contractions.  You have belly (abdominal) pain.  You have bleeding from your vagina.  You have pain when you pee (urinate).  You have abnormal discharge from your vagina.  You have a temperature by mouth above 102 F (38.9 C). MAKE SURE YOU:  Understand these instructions.  Will watch your condition.  Will get help if you are not doing well or get worse. Document Released: 06/17/2008 Document Revised: 06/13/2011 Document Reviewed: 06/17/2008 ExitCare Patient Information 2013 ExitCare, LLC.  

## 2012-07-03 ENCOUNTER — Encounter: Payer: Self-pay | Admitting: Obstetrics & Gynecology

## 2012-07-09 ENCOUNTER — Ambulatory Visit (INDEPENDENT_AMBULATORY_CARE_PROVIDER_SITE_OTHER): Payer: BC Managed Care – PPO | Admitting: Obstetrics & Gynecology

## 2012-07-09 ENCOUNTER — Encounter: Payer: Self-pay | Admitting: Obstetrics & Gynecology

## 2012-07-09 DIAGNOSIS — O09219 Supervision of pregnancy with history of pre-term labor, unspecified trimester: Secondary | ICD-10-CM

## 2012-07-09 LAB — POCT URINALYSIS DIPSTICK
Glucose, UA: NEGATIVE
Nitrite, UA: NEGATIVE
Protein, UA: NEGATIVE

## 2012-07-09 MED ORDER — HYDROXYPROGESTERONE CAPROATE 250 MG/ML IM OIL
250.0000 mg | TOPICAL_OIL | Freq: Once | INTRAMUSCULAR | Status: AC
  Administered 2012-07-09: 250 mg via INTRAMUSCULAR

## 2012-07-16 ENCOUNTER — Encounter: Payer: Self-pay | Admitting: Obstetrics & Gynecology

## 2012-07-16 ENCOUNTER — Ambulatory Visit (INDEPENDENT_AMBULATORY_CARE_PROVIDER_SITE_OTHER): Payer: BC Managed Care – PPO | Admitting: Obstetrics & Gynecology

## 2012-07-16 VITALS — BP 110/72 | Ht 63.0 in | Wt 248.0 lb

## 2012-07-16 DIAGNOSIS — Z1389 Encounter for screening for other disorder: Secondary | ICD-10-CM

## 2012-07-16 DIAGNOSIS — O09219 Supervision of pregnancy with history of pre-term labor, unspecified trimester: Secondary | ICD-10-CM

## 2012-07-16 DIAGNOSIS — Z331 Pregnant state, incidental: Secondary | ICD-10-CM

## 2012-07-16 LAB — POCT URINALYSIS DIPSTICK
Ketones, UA: NEGATIVE
Leukocytes, UA: NEGATIVE
Protein, UA: NEGATIVE

## 2012-07-16 MED ORDER — HYDROXYPROGESTERONE CAPROATE 250 MG/ML IM OIL
250.0000 mg | TOPICAL_OIL | Freq: Once | INTRAMUSCULAR | Status: AC
  Administered 2012-07-16: 250 mg via INTRAMUSCULAR

## 2012-07-23 ENCOUNTER — Ambulatory Visit (INDEPENDENT_AMBULATORY_CARE_PROVIDER_SITE_OTHER): Payer: BC Managed Care – PPO | Admitting: Obstetrics & Gynecology

## 2012-07-23 ENCOUNTER — Encounter: Payer: Self-pay | Admitting: *Deleted

## 2012-07-23 ENCOUNTER — Encounter: Payer: Self-pay | Admitting: Obstetrics & Gynecology

## 2012-07-23 VITALS — BP 120/76 | Wt 249.0 lb

## 2012-07-23 DIAGNOSIS — Z331 Pregnant state, incidental: Secondary | ICD-10-CM

## 2012-07-23 DIAGNOSIS — O09219 Supervision of pregnancy with history of pre-term labor, unspecified trimester: Secondary | ICD-10-CM

## 2012-07-23 DIAGNOSIS — Z1389 Encounter for screening for other disorder: Secondary | ICD-10-CM

## 2012-07-23 LAB — POCT URINALYSIS DIPSTICK
Blood, UA: NEGATIVE
Ketones, UA: NEGATIVE
Protein, UA: NEGATIVE

## 2012-07-23 MED ORDER — HYDROXYPROGESTERONE CAPROATE 250 MG/ML IM OIL
250.0000 mg | TOPICAL_OIL | Freq: Once | INTRAMUSCULAR | Status: AC
  Administered 2012-07-23: 250 mg via INTRAMUSCULAR

## 2012-07-23 NOTE — Progress Notes (Signed)
Pt here today for her 17P injection. She denies any problems at this time.

## 2012-07-30 ENCOUNTER — Encounter: Payer: Self-pay | Admitting: Obstetrics and Gynecology

## 2012-07-30 ENCOUNTER — Ambulatory Visit (INDEPENDENT_AMBULATORY_CARE_PROVIDER_SITE_OTHER): Payer: BC Managed Care – PPO | Admitting: Obstetrics and Gynecology

## 2012-07-30 VITALS — BP 130/60 | Wt 247.6 lb

## 2012-07-30 DIAGNOSIS — O09219 Supervision of pregnancy with history of pre-term labor, unspecified trimester: Secondary | ICD-10-CM

## 2012-07-30 DIAGNOSIS — Z1389 Encounter for screening for other disorder: Secondary | ICD-10-CM

## 2012-07-30 DIAGNOSIS — Z3480 Encounter for supervision of other normal pregnancy, unspecified trimester: Secondary | ICD-10-CM

## 2012-07-30 DIAGNOSIS — Z331 Pregnant state, incidental: Secondary | ICD-10-CM

## 2012-07-30 LAB — POCT URINALYSIS DIPSTICK
Blood, UA: NEGATIVE
Glucose, UA: NEGATIVE

## 2012-07-30 MED ORDER — HYDROXYPROGESTERONE CAPROATE 250 MG/ML IM OIL
250.0000 mg | TOPICAL_OIL | Freq: Once | INTRAMUSCULAR | Status: AC
  Administered 2012-07-30: 250 mg via INTRAMUSCULAR

## 2012-07-30 NOTE — Progress Notes (Signed)
17 P given for hx PPROM 35+ wk.  +GFM, - bleeding,  Feels abd pulsations, when supine, reasssured.

## 2012-08-06 ENCOUNTER — Encounter: Payer: Self-pay | Admitting: Obstetrics & Gynecology

## 2012-08-06 ENCOUNTER — Other Ambulatory Visit: Payer: BC Managed Care – PPO

## 2012-08-06 ENCOUNTER — Encounter: Payer: BC Managed Care – PPO | Admitting: Obstetrics and Gynecology

## 2012-08-06 ENCOUNTER — Ambulatory Visit (INDEPENDENT_AMBULATORY_CARE_PROVIDER_SITE_OTHER): Payer: BC Managed Care – PPO | Admitting: Obstetrics & Gynecology

## 2012-08-06 VITALS — BP 116/70 | Wt 248.5 lb

## 2012-08-06 DIAGNOSIS — Z331 Pregnant state, incidental: Secondary | ICD-10-CM

## 2012-08-06 DIAGNOSIS — O09219 Supervision of pregnancy with history of pre-term labor, unspecified trimester: Secondary | ICD-10-CM

## 2012-08-06 DIAGNOSIS — Z1389 Encounter for screening for other disorder: Secondary | ICD-10-CM

## 2012-08-06 LAB — POCT URINALYSIS DIPSTICK
Ketones, UA: NEGATIVE
Nitrite, UA: NEGATIVE
Protein, UA: NEGATIVE

## 2012-08-06 MED ORDER — HYDROXYPROGESTERONE CAPROATE 250 MG/ML IM OIL
250.0000 mg | TOPICAL_OIL | Freq: Once | INTRAMUSCULAR | Status: AC
  Administered 2012-08-06: 250 mg via INTRAMUSCULAR

## 2012-08-06 NOTE — Progress Notes (Signed)
17P injection 

## 2012-08-13 ENCOUNTER — Encounter: Payer: Self-pay | Admitting: Obstetrics & Gynecology

## 2012-08-13 ENCOUNTER — Ambulatory Visit (INDEPENDENT_AMBULATORY_CARE_PROVIDER_SITE_OTHER): Payer: BC Managed Care – PPO | Admitting: Obstetrics & Gynecology

## 2012-08-13 VITALS — BP 130/80 | Wt 246.0 lb

## 2012-08-13 DIAGNOSIS — Z1389 Encounter for screening for other disorder: Secondary | ICD-10-CM

## 2012-08-13 DIAGNOSIS — O09219 Supervision of pregnancy with history of pre-term labor, unspecified trimester: Secondary | ICD-10-CM

## 2012-08-13 LAB — POCT URINALYSIS DIPSTICK
Blood, UA: NEGATIVE
Glucose, UA: NEGATIVE
Nitrite, UA: NEGATIVE

## 2012-08-13 MED ORDER — HYDROXYPROGESTERONE CAPROATE 250 MG/ML IM OIL
250.0000 mg | TOPICAL_OIL | Freq: Once | INTRAMUSCULAR | Status: AC
  Administered 2012-08-13: 250 mg via INTRAMUSCULAR

## 2012-08-13 NOTE — Progress Notes (Signed)
BP weight and urine results all reviewed and noted. Patient reports good fetal movement, denies any bleeding and no rupture of membranes symptoms or regular contractions. Patient is without complaints. All questions were answered.  

## 2012-08-20 ENCOUNTER — Ambulatory Visit (INDEPENDENT_AMBULATORY_CARE_PROVIDER_SITE_OTHER): Payer: BC Managed Care – PPO | Admitting: Obstetrics & Gynecology

## 2012-08-20 ENCOUNTER — Encounter: Payer: Self-pay | Admitting: Obstetrics & Gynecology

## 2012-08-20 VITALS — BP 120/82 | Wt 248.0 lb

## 2012-08-20 DIAGNOSIS — O09219 Supervision of pregnancy with history of pre-term labor, unspecified trimester: Secondary | ICD-10-CM

## 2012-08-20 DIAGNOSIS — Z1389 Encounter for screening for other disorder: Secondary | ICD-10-CM

## 2012-08-20 LAB — POCT URINALYSIS DIPSTICK
Blood, UA: NEGATIVE
Nitrite, UA: NEGATIVE
Protein, UA: NEGATIVE

## 2012-08-20 MED ORDER — HYDROXYPROGESTERONE CAPROATE 250 MG/ML IM OIL
250.0000 mg | TOPICAL_OIL | Freq: Once | INTRAMUSCULAR | Status: AC
  Administered 2012-08-20: 250 mg via INTRAMUSCULAR

## 2012-08-20 NOTE — Progress Notes (Signed)
Pt here today for 17P injection. Pt denies any problems at this time.

## 2012-08-28 ENCOUNTER — Ambulatory Visit (INDEPENDENT_AMBULATORY_CARE_PROVIDER_SITE_OTHER): Payer: BC Managed Care – PPO | Admitting: Women's Health

## 2012-08-28 ENCOUNTER — Other Ambulatory Visit: Payer: BC Managed Care – PPO

## 2012-08-28 ENCOUNTER — Encounter: Payer: Self-pay | Admitting: Women's Health

## 2012-08-28 VITALS — BP 120/70 | Wt 249.6 lb

## 2012-08-28 DIAGNOSIS — O09899 Supervision of other high risk pregnancies, unspecified trimester: Secondary | ICD-10-CM

## 2012-08-28 DIAGNOSIS — O09219 Supervision of pregnancy with history of pre-term labor, unspecified trimester: Secondary | ICD-10-CM

## 2012-08-28 DIAGNOSIS — Z1389 Encounter for screening for other disorder: Secondary | ICD-10-CM

## 2012-08-28 DIAGNOSIS — Z331 Pregnant state, incidental: Secondary | ICD-10-CM

## 2012-08-28 DIAGNOSIS — O0990 Supervision of high risk pregnancy, unspecified, unspecified trimester: Secondary | ICD-10-CM | POA: Insufficient documentation

## 2012-08-28 DIAGNOSIS — O0993 Supervision of high risk pregnancy, unspecified, third trimester: Secondary | ICD-10-CM

## 2012-08-28 DIAGNOSIS — Z3480 Encounter for supervision of other normal pregnancy, unspecified trimester: Secondary | ICD-10-CM

## 2012-08-28 LAB — POCT URINALYSIS DIPSTICK
Glucose, UA: NEGATIVE
Ketones, UA: NEGATIVE

## 2012-08-28 LAB — CBC
MCH: 30 pg (ref 26.0–34.0)
MCHC: 34.3 g/dL (ref 30.0–36.0)
MCV: 87.2 fL (ref 78.0–100.0)
Platelets: 255 10*3/uL (ref 150–400)
RDW: 13.8 % (ref 11.5–15.5)

## 2012-08-28 MED ORDER — HYDROXYPROGESTERONE CAPROATE 250 MG/ML IM OIL
250.0000 mg | TOPICAL_OIL | Freq: Once | INTRAMUSCULAR | Status: AC
  Administered 2012-08-28: 250 mg via INTRAMUSCULAR

## 2012-08-28 NOTE — Progress Notes (Signed)
Reports good fm. Denies uc's, lof, vb, urinary frequency, urgency, hesitancy, or dysuria.  No complaints.  Reviewed ptl s/s, and fetal kick counts.  All questions answered. F/U in 4wks for visit. PN2 today

## 2012-08-28 NOTE — Patient Instructions (Signed)

## 2012-08-29 LAB — RPR

## 2012-08-29 LAB — GLUCOSE TOLERANCE, 2 HOURS W/ 1HR: Glucose, Fasting: 80 mg/dL (ref 70–99)

## 2012-08-31 ENCOUNTER — Telehealth: Payer: Self-pay | Admitting: Obstetrics and Gynecology

## 2012-08-31 NOTE — Telephone Encounter (Signed)
Pt informed of WNL Glucose testing and labs on 08/28/2012

## 2012-09-04 ENCOUNTER — Ambulatory Visit (INDEPENDENT_AMBULATORY_CARE_PROVIDER_SITE_OTHER): Payer: BC Managed Care – PPO | Admitting: Adult Health

## 2012-09-04 ENCOUNTER — Encounter: Payer: Self-pay | Admitting: Adult Health

## 2012-09-04 VITALS — BP 120/76 | Wt 253.0 lb

## 2012-09-04 DIAGNOSIS — Z1389 Encounter for screening for other disorder: Secondary | ICD-10-CM

## 2012-09-04 DIAGNOSIS — O09219 Supervision of pregnancy with history of pre-term labor, unspecified trimester: Secondary | ICD-10-CM

## 2012-09-04 LAB — POCT URINALYSIS DIPSTICK
Glucose, UA: NEGATIVE
Leukocytes, UA: NEGATIVE
Protein, UA: NEGATIVE

## 2012-09-04 MED ORDER — HYDROXYPROGESTERONE CAPROATE 250 MG/ML IM OIL
250.0000 mg | TOPICAL_OIL | Freq: Once | INTRAMUSCULAR | Status: AC
  Administered 2012-09-04: 250 mg via INTRAMUSCULAR

## 2012-09-04 NOTE — Progress Notes (Signed)
Pt here today for 17P injection only.

## 2012-09-11 ENCOUNTER — Encounter: Payer: Self-pay | Admitting: Obstetrics & Gynecology

## 2012-09-11 ENCOUNTER — Ambulatory Visit (INDEPENDENT_AMBULATORY_CARE_PROVIDER_SITE_OTHER): Payer: BC Managed Care – PPO | Admitting: Obstetrics & Gynecology

## 2012-09-11 VITALS — BP 120/80 | Wt 254.0 lb

## 2012-09-11 DIAGNOSIS — Z3200 Encounter for pregnancy test, result unknown: Secondary | ICD-10-CM

## 2012-09-11 DIAGNOSIS — Z1389 Encounter for screening for other disorder: Secondary | ICD-10-CM

## 2012-09-11 DIAGNOSIS — Z331 Pregnant state, incidental: Secondary | ICD-10-CM

## 2012-09-11 DIAGNOSIS — O09219 Supervision of pregnancy with history of pre-term labor, unspecified trimester: Secondary | ICD-10-CM

## 2012-09-11 LAB — POCT URINALYSIS DIPSTICK: Protein, UA: NEGATIVE

## 2012-09-11 MED ORDER — HYDROXYPROGESTERONE CAPROATE 250 MG/ML IM OIL
250.0000 mg | TOPICAL_OIL | Freq: Once | INTRAMUSCULAR | Status: AC
  Administered 2012-09-11: 250 mg via INTRAMUSCULAR

## 2012-09-11 NOTE — Progress Notes (Signed)
Pt here today for 17P injection only. Pt denies any problems at this time.  

## 2012-09-18 ENCOUNTER — Encounter: Payer: Self-pay | Admitting: Women's Health

## 2012-09-18 ENCOUNTER — Ambulatory Visit (INDEPENDENT_AMBULATORY_CARE_PROVIDER_SITE_OTHER): Payer: BC Managed Care – PPO | Admitting: Women's Health

## 2012-09-18 VITALS — BP 122/88 | Wt 251.8 lb

## 2012-09-18 DIAGNOSIS — O09219 Supervision of pregnancy with history of pre-term labor, unspecified trimester: Secondary | ICD-10-CM

## 2012-09-18 DIAGNOSIS — Z1389 Encounter for screening for other disorder: Secondary | ICD-10-CM

## 2012-09-18 DIAGNOSIS — O09899 Supervision of other high risk pregnancies, unspecified trimester: Secondary | ICD-10-CM

## 2012-09-18 DIAGNOSIS — Z331 Pregnant state, incidental: Secondary | ICD-10-CM

## 2012-09-18 DIAGNOSIS — O09523 Supervision of elderly multigravida, third trimester: Secondary | ICD-10-CM

## 2012-09-18 LAB — POCT URINALYSIS DIPSTICK
Blood, UA: NEGATIVE
Glucose, UA: NEGATIVE
Nitrite, UA: NEGATIVE

## 2012-09-18 MED ORDER — HYDROXYPROGESTERONE CAPROATE 250 MG/ML IM OIL
250.0000 mg | TOPICAL_OIL | Freq: Once | INTRAMUSCULAR | Status: AC
  Administered 2012-09-18: 250 mg via INTRAMUSCULAR

## 2012-09-18 NOTE — Progress Notes (Signed)
17P today.  

## 2012-09-18 NOTE — Progress Notes (Signed)
Reports good fm. Denies uc's, lof, vb, urinary frequency, urgency, hesitancy, or dysuria.  Reports LBP and leg cramps.  Reviewed ptl s/s, fetal kick counts, LBP and leg cramp relief measures.  All questions answered. F/U in 2wks for visit, continue 17P til 37wks.

## 2012-09-18 NOTE — Patient Instructions (Signed)
Leg Cramps  Leg cramps that occur during exercise can be caused by poor circulation or dehydration. However, muscle cramps that occur at rest or during the night are usually not due to any serious medical problem. Heat cramps may cause muscle spasms during hot weather.   CAUSES  There is no clear cause for muscle cramps. However, dehydration may be a factor for those who do not drink enough fluids and those who exercise in the heat. Imbalances in the level of sodium, potassium, calcium or magnesium in the muscle tissue may also be a factor. Some medications, such as water pills (diuretics), may cause loss of chemicals that the body needs (like sodium and potassium) and cause muscle cramps.  TREATMENT   · Make sure your diet has enough fluids and essential minerals for the muscle to work normally.  · Avoid strenuous exercise for several days if you have been having frequent leg cramps.  · Stretch and massage the cramped muscle for several minutes.  · Some medicines may be helpful in some patients with night cramps. Only take over-the-counter or prescription medicines as directed by your caregiver.  SEEK IMMEDIATE MEDICAL CARE IF:   · Your leg cramps become worse.  · Your foot becomes cold, numb, or blue.  Document Released: 04/28/2004 Document Revised: 06/13/2011 Document Reviewed: 04/15/2008  ExitCare® Patient Information ©2014 ExitCare, LLC.

## 2012-09-19 ENCOUNTER — Telehealth: Payer: Self-pay | Admitting: Advanced Practice Midwife

## 2012-09-19 NOTE — Telephone Encounter (Signed)
Pt states Joellyn Haff, CNM placed her on light duties at work and was given a noted, however, her employer is requesting more information including what restrictions and why.

## 2012-09-19 NOTE — Telephone Encounter (Signed)
Left message. JSY 

## 2012-09-24 ENCOUNTER — Telehealth: Payer: Self-pay | Admitting: Women's Health

## 2012-09-24 NOTE — Telephone Encounter (Signed)
[name] states employer wants more specific info: lifting requirements, breaks, etc.  Will amend note and pt to come by and pick up.  Cheron Every Rigby, [place] 09/24/2012 8:53 AM

## 2012-09-25 ENCOUNTER — Encounter: Payer: BC Managed Care – PPO | Admitting: Women's Health

## 2012-09-25 ENCOUNTER — Encounter: Payer: Self-pay | Admitting: Obstetrics & Gynecology

## 2012-09-25 ENCOUNTER — Ambulatory Visit (INDEPENDENT_AMBULATORY_CARE_PROVIDER_SITE_OTHER): Payer: BC Managed Care – PPO | Admitting: Advanced Practice Midwife

## 2012-09-25 ENCOUNTER — Ambulatory Visit (INDEPENDENT_AMBULATORY_CARE_PROVIDER_SITE_OTHER): Payer: BC Managed Care – PPO | Admitting: Obstetrics & Gynecology

## 2012-09-25 VITALS — BP 120/70 | Wt 253.0 lb

## 2012-09-25 DIAGNOSIS — O09899 Supervision of other high risk pregnancies, unspecified trimester: Secondary | ICD-10-CM

## 2012-09-25 DIAGNOSIS — O09219 Supervision of pregnancy with history of pre-term labor, unspecified trimester: Secondary | ICD-10-CM

## 2012-09-25 DIAGNOSIS — O0990 Supervision of high risk pregnancy, unspecified, unspecified trimester: Secondary | ICD-10-CM

## 2012-09-25 DIAGNOSIS — Z1389 Encounter for screening for other disorder: Secondary | ICD-10-CM

## 2012-09-25 DIAGNOSIS — Z331 Pregnant state, incidental: Secondary | ICD-10-CM

## 2012-09-25 LAB — POCT URINALYSIS DIPSTICK: Blood, UA: NEGATIVE

## 2012-09-25 MED ORDER — HYDROXYPROGESTERONE CAPROATE 250 MG/ML IM OIL
250.0000 mg | TOPICAL_OIL | Freq: Once | INTRAMUSCULAR | Status: AC
  Administered 2012-09-25: 250 mg via INTRAMUSCULAR

## 2012-09-25 NOTE — Progress Notes (Signed)
Pt here today for 17P injection. Pt states that she has noticed some tightness in the top of her stomach and some pressure that some and goes. Pt staes she has good baby movement. Pt denies any bleeding or other problems at this time.

## 2012-09-25 NOTE — Addendum Note (Signed)
Addended by: Jacklyn Shell on: 09/25/2012 03:59 PM   Modules accepted: Orders

## 2012-09-25 NOTE — Progress Notes (Signed)
startd feeling pressure Saturday, "several times a day".  Occ tightness.  fFn collected. Pt to call in the am for results. Already on light duty at work.

## 2012-09-26 ENCOUNTER — Telehealth: Payer: Self-pay | Admitting: Advanced Practice Midwife

## 2012-09-26 NOTE — Telephone Encounter (Signed)
Spoke with pt. Pt aware that FFN was negative and note for light duty was at the front desk. JSY

## 2012-09-26 NOTE — Telephone Encounter (Signed)
No answer. JSY 

## 2012-09-27 ENCOUNTER — Telehealth: Payer: Self-pay | Admitting: *Deleted

## 2012-09-27 ENCOUNTER — Encounter (HOSPITAL_COMMUNITY): Payer: Self-pay | Admitting: *Deleted

## 2012-09-27 ENCOUNTER — Inpatient Hospital Stay (HOSPITAL_COMMUNITY)
Admission: AD | Admit: 2012-09-27 | Discharge: 2012-09-27 | Disposition: A | Discharge status: Home / Self Care | Payer: BC Managed Care – PPO | Source: Ambulatory Visit | Attending: Obstetrics & Gynecology | Admitting: Obstetrics & Gynecology

## 2012-09-27 DIAGNOSIS — R109 Unspecified abdominal pain: Secondary | ICD-10-CM | POA: Insufficient documentation

## 2012-09-27 DIAGNOSIS — O4703 False labor before 37 completed weeks of gestation, third trimester: Secondary | ICD-10-CM

## 2012-09-27 DIAGNOSIS — O4700 False labor before 37 completed weeks of gestation, unspecified trimester: Secondary | ICD-10-CM | POA: Insufficient documentation

## 2012-09-27 LAB — URINALYSIS, ROUTINE W REFLEX MICROSCOPIC
Glucose, UA: NEGATIVE mg/dL
Hgb urine dipstick: NEGATIVE
Specific Gravity, Urine: 1.02 (ref 1.005–1.030)
Urobilinogen, UA: 1 mg/dL (ref 0.0–1.0)

## 2012-09-27 LAB — URINE MICROSCOPIC-ADD ON

## 2012-09-27 MED ORDER — NIFEDIPINE 10 MG PO CAPS
30.0000 mg | ORAL_CAPSULE | Freq: Once | ORAL | Status: AC
  Administered 2012-09-27: 30 mg via ORAL
  Filled 2012-09-27: qty 3

## 2012-09-27 MED ORDER — NIFEDIPINE ER OSMOTIC RELEASE 30 MG PO TB24: 30.0000 mg | ORAL_TABLET | Freq: Every day | ORAL | Status: DC

## 2012-09-27 MED ORDER — OXYCODONE-ACETAMINOPHEN 5-325 MG PO TABS
2.0000 | ORAL_TABLET | Freq: Once | ORAL | Status: AC
  Administered 2012-09-27: 2 via ORAL
  Filled 2012-09-27: qty 2

## 2012-09-27 NOTE — Telephone Encounter (Signed)
Pt states having sharp vaginal pains and cramps, no bleeding, decrease in FM. Per Cyril Mourning, NP pt to go to MAU now to be evaluated. Pt verbalized understanding.

## 2012-09-27 NOTE — MAU Provider Note (Signed)
  History     CSN: [id number]  Arrival date and time: 09/27/12 1524   First Provider Initiated Contact with Patient 09/27/12 1625      Chief Complaint  Patient presents with  . Abdominal Pain   HPI  [name] is a 26 y.o. [phone] at [redacted]w[redacted]d who presents with crampy abdominal pain x 2 days.  Patient has had intermittent, mild-moderate crampy abdominal pain x 2 days (since her last office visit).  She also endorses some pelvic pressure.  No loss of fluid or vaginal bleeding.  She does report 1 episode of mucousy discharge (mucus plug). No other complaints currently.   Patient [facility]; She is currently on 17-P as she had a prior delivery at 35 weeks.  Had a negative fFn Tuesday at the office.   Past Medical History  Diagnosis Date  . Hypertension   . Migraines   . Preterm delivery     Past Surgical History  Procedure Laterality Date  . Cholecystectomy    . Tonsillectomy      Family History  Problem Relation Age of Onset  . Hypertension Mother   . Hypertension Father   . Hypertension Maternal Grandmother   . Hypertension Maternal Grandfather   . Diabetes Paternal Grandmother   . Hypertension Paternal Grandmother   . Heart disease Paternal Grandfather   . Diabetes Paternal Grandfather   . Hypertension Paternal Grandfather     History  Substance Use Topics  . Smoking status: Never Smoker   . Smokeless tobacco: Never Used  . Alcohol Use: No    Allergies:  Allergies  Allergen Reactions  . Codeine Other (See Comments)    Does not like the way it makes her feel.  . Dilaudid (Hydromorphone Hcl) Hives and Itching    Prescriptions prior to admission  Medication Sig Dispense Refill  . calcium carbonate (TUMS - DOSED IN MG ELEMENTAL CALCIUM) 500 MG chewable tablet Chew 1 tablet by mouth daily as needed for heartburn.      . hydroxyprogesterone caproate (DELALUTIN) 250 mg/mL OIL Inject 250 mg into the muscle once a week. Patient receives  injection on Tuesdays.      . Prenatal Vit-Fe Fumarate-FA (PRENATAL MULTIVITAMIN) TABS Take 1 tablet by mouth daily.        ROS Per HPI Physical Exam   Physical Exam Gen: well appearing, but flat affect noted. NAD> Heart: RRR. No m/r/g Lungs: CTAB. Abd: gravid but otherwise soft, nontender to palpation Ext: no appreciable lower extremity edema bilaterally Neuro: grossly nonfocal, speech intact GU: normal appearing external genitalia Cervical Exam: Dilation: 1.5 Effacement (%): Thick Exam by:: Dr. Adriana Simas  FHR: baseline 130, mod variability, 15x15 accels, no decels Toco: irregular, every 2-5 min  MAU Course  Procedures  Assessment and Plan  26 y.o. [id number] at [redacted]w[redacted]d who presents with crampy abdominal pain x 2 days. - Cervical exam unchanged. Contractions on Toco. - Will give Procardia 30 mg x 1 and monitor closely.  Encouraging PO fluids.   1840 - Tracing now improved.  Will give PO Percocet and discharge home with labor precautions. Everlene Other 09/27/2012, 4:30 PM    Pt's contractions decreased after about an hour, but were still mild and irregular.  Pt offered more aggressive treatment to stop them, but refused.  Accepted 10mg  Percocet and a Rx for Procardia to take at home.  Pt knows to f/u if contractions increase in frequency/intensity.

## 2012-09-27 NOTE — MAU Note (Signed)
Pt states she has been having contractions on and off since Tuesday 09/25/2012

## 2012-10-02 ENCOUNTER — Encounter: Payer: Self-pay | Admitting: Women's Health

## 2012-10-02 ENCOUNTER — Ambulatory Visit (INDEPENDENT_AMBULATORY_CARE_PROVIDER_SITE_OTHER): Payer: BC Managed Care – PPO | Admitting: Women's Health

## 2012-10-02 VITALS — BP 122/82 | Wt 254.2 lb

## 2012-10-02 DIAGNOSIS — O99210 Obesity complicating pregnancy, unspecified trimester: Secondary | ICD-10-CM

## 2012-10-02 DIAGNOSIS — E669 Obesity, unspecified: Secondary | ICD-10-CM

## 2012-10-02 DIAGNOSIS — O10019 Pre-existing essential hypertension complicating pregnancy, unspecified trimester: Secondary | ICD-10-CM

## 2012-10-02 DIAGNOSIS — O09899 Supervision of other high risk pregnancies, unspecified trimester: Secondary | ICD-10-CM

## 2012-10-02 DIAGNOSIS — Z1389 Encounter for screening for other disorder: Secondary | ICD-10-CM

## 2012-10-02 DIAGNOSIS — O9989 Other specified diseases and conditions complicating pregnancy, childbirth and the puerperium: Secondary | ICD-10-CM

## 2012-10-02 DIAGNOSIS — O360990 Maternal care for other rhesus isoimmunization, unspecified trimester, not applicable or unspecified: Secondary | ICD-10-CM

## 2012-10-02 DIAGNOSIS — O09219 Supervision of pregnancy with history of pre-term labor, unspecified trimester: Secondary | ICD-10-CM

## 2012-10-02 DIAGNOSIS — Z331 Pregnant state, incidental: Secondary | ICD-10-CM

## 2012-10-02 DIAGNOSIS — O0993 Supervision of high risk pregnancy, unspecified, third trimester: Secondary | ICD-10-CM

## 2012-10-02 LAB — POCT URINALYSIS DIPSTICK
Blood, UA: NEGATIVE
Leukocytes, UA: NEGATIVE
Nitrite, UA: NEGATIVE
Protein, UA: NEGATIVE

## 2012-10-02 MED ORDER — HYDROXYPROGESTERONE CAPROATE 250 MG/ML IM OIL
250.0000 mg | TOPICAL_OIL | Freq: Once | INTRAMUSCULAR | Status: AC
  Administered 2012-10-02: 250 mg via INTRAMUSCULAR

## 2012-10-02 NOTE — Progress Notes (Addendum)
Reports good fm. Denies vb, urinary frequency, urgency, hesitancy, or dysuria. Has been having irregular cramping/uc's, and pressure. Leakage of small amount of fluid this am, none since. SSE: no pooling of fluid, cx visually closed. Fern neg. SVE today: 1.5/60/-3, vtx. -fFN 6/24. MAU visit 6/26 for same, cx was 1.5/thick then, given procardia xl 30mg  to take daily prn per pt report. States she doesn't think it really helped in MAU so she hasn't been taking it. Recommended taking it daily. Increasing po fluids to stay well hydrated, rest.  Discussed contraception options, was thinking about BTL, but not sure, decided on nexplanon for now. Reviewed ptl s/s, fetal kick counts.  All questions answered. F/U in 1wk for visit and 17P injection.

## 2012-10-02 NOTE — Patient Instructions (Signed)

## 2012-10-03 ENCOUNTER — Inpatient Hospital Stay (HOSPITAL_COMMUNITY): Payer: BC Managed Care – PPO

## 2012-10-03 ENCOUNTER — Inpatient Hospital Stay (HOSPITAL_COMMUNITY)
Admission: AD | Admit: 2012-10-03 | Discharge: 2012-10-04 | DRG: 379 | Disposition: A | Discharge status: Home / Self Care | Payer: BC Managed Care – PPO | Source: Ambulatory Visit | Attending: Obstetrics and Gynecology | Admitting: Obstetrics and Gynecology

## 2012-10-03 ENCOUNTER — Encounter (HOSPITAL_COMMUNITY): Payer: Self-pay

## 2012-10-03 DIAGNOSIS — O0993 Supervision of high risk pregnancy, unspecified, third trimester: Secondary | ICD-10-CM

## 2012-10-03 DIAGNOSIS — O42913 Preterm premature rupture of membranes, unspecified as to length of time between rupture and onset of labor, third trimester: Secondary | ICD-10-CM

## 2012-10-03 DIAGNOSIS — M545 Low back pain, unspecified: Secondary | ICD-10-CM | POA: Diagnosis present

## 2012-10-03 DIAGNOSIS — O4290 Premature rupture of membranes, unspecified as to length of time between rupture and onset of labor, unspecified weeks of gestation: Secondary | ICD-10-CM | POA: Diagnosis present

## 2012-10-03 DIAGNOSIS — O4700 False labor before 37 completed weeks of gestation, unspecified trimester: Principal | ICD-10-CM | POA: Diagnosis present

## 2012-10-03 DIAGNOSIS — O09899 Supervision of other high risk pregnancies, unspecified trimester: Secondary | ICD-10-CM

## 2012-10-03 HISTORY — DX: Other seasonal allergic rhinitis: J30.2

## 2012-10-03 HISTORY — DX: Diffuse cystic mastopathy of unspecified breast: N60.19

## 2012-10-03 HISTORY — DX: Depression, unspecified: F32.A

## 2012-10-03 HISTORY — DX: Obesity, unspecified: E66.9

## 2012-10-03 HISTORY — DX: Major depressive disorder, single episode, unspecified: F32.9

## 2012-10-03 HISTORY — DX: Urinary tract infection, site not specified: N39.0

## 2012-10-03 LAB — URINALYSIS, ROUTINE W REFLEX MICROSCOPIC
Bilirubin Urine: NEGATIVE
Hgb urine dipstick: NEGATIVE
Ketones, ur: NEGATIVE mg/dL
Nitrite: NEGATIVE
Protein, ur: NEGATIVE mg/dL
Specific Gravity, Urine: 1.025 (ref 1.005–1.030)
Urobilinogen, UA: 0.2 mg/dL (ref 0.0–1.0)

## 2012-10-03 LAB — AMNISURE RUPTURE OF MEMBRANE (ROM) NOT AT ARMC: Amnisure ROM: POSITIVE

## 2012-10-03 MED ORDER — PRENATAL MULTIVITAMIN CH: 1.0000 | ORAL_TABLET | Freq: Every day | ORAL | Status: DC

## 2012-10-03 MED ORDER — ACETAMINOPHEN 325 MG PO TABS
650.0000 mg | ORAL_TABLET | Freq: Four times a day (QID) | ORAL | Status: DC | PRN
  Administered 2012-10-03: 650 mg via ORAL
  Filled 2012-10-03: qty 2
  Filled 2012-10-03: qty 1

## 2012-10-03 MED ORDER — ZOLPIDEM TARTRATE 5 MG PO TABS: 5.0000 mg | ORAL_TABLET | Freq: Every evening | ORAL | Status: DC | PRN

## 2012-10-03 MED ORDER — ACETAMINOPHEN 325 MG PO TABS: 650.0000 mg | ORAL_TABLET | ORAL | Status: DC | PRN

## 2012-10-03 MED ORDER — BETAMETHASONE SOD PHOS & ACET 6 (3-3) MG/ML IJ SUSP
12.0000 mg | INTRAMUSCULAR | Status: AC
  Administered 2012-10-03 – 2012-10-04 (×2): 12 mg via INTRAMUSCULAR
  Filled 2012-10-03 (×2): qty 2

## 2012-10-03 MED ORDER — SODIUM CHLORIDE 0.9 % IV SOLN
2.0000 g | Freq: Four times a day (QID) | INTRAVENOUS | Status: DC
  Administered 2012-10-03 – 2012-10-04 (×3): 2 g via INTRAVENOUS
  Filled 2012-10-03 (×4): qty 2000

## 2012-10-03 MED ORDER — AMOXICILLIN 500 MG PO CAPS: 500.0000 mg | ORAL_CAPSULE | Freq: Three times a day (TID) | ORAL | Status: DC

## 2012-10-03 MED ORDER — CALCIUM CARBONATE ANTACID 500 MG PO CHEW: 2.0000 | CHEWABLE_TABLET | ORAL | Status: DC | PRN

## 2012-10-03 MED ORDER — DOCUSATE SODIUM 100 MG PO CAPS
100.0000 mg | ORAL_CAPSULE | Freq: Every day | ORAL | Status: DC
  Administered 2012-10-04: 100 mg via ORAL
  Filled 2012-10-03: qty 1

## 2012-10-03 NOTE — MAU Note (Signed)
Patient is in with c/o lower back pain, lower abdominal cramping and an episode earlier today where she had a small gush of fluid. Patient states that she have not needed to wear a pantiliner or pad. She denies vaginal bleeding. Reports good fetal movement. Patient states that she was 1.5cm yesterday (have been for weeks), she was prescribed procardia 30mg  xl as needed. Patient states that she have not taken any dose yet. Patient reports having pprom with her previous pregnancy delivering at [facility].

## 2012-10-03 NOTE — Progress Notes (Signed)
Dr Thad Ranger notified that [facility] is positive. Will come to MAU to discuss with patient

## 2012-10-03 NOTE — H&P (Signed)
[name] is a 27 y.o. [phone] at [redacted]w[redacted]d admitted for rupture of membranes.   0 Fetal presentation is unsure.  History of Present Illness: Cramping x 1 week. This morning started to get pain that felt like contractions every 30 mins, 1 min long. Had a small amount of clear fluid one time, no subsequent gushing. Seen last week with cervix dilation 1.5cm, yesterday still 1.5cm but 60% effaced. No recent sexual intercourse. Previous preterm delivery at 35 weeks with PPROM, SVD.  On 17P shots every Tuesday.  Patient reports the fetal movement as active. Patient reports uterine contraction  activity as irregular, every 30 minutes, less now. Patient reports  vaginal bleeding as none. Patient describes fluid per vagina as Clear.  Patient Active Problem List   Diagnosis Date Noted  . Supervision of high-risk pregnancy 08/28/2012  . History of preterm delivery, currently pregnant 07/02/2012  . UTI (urinary tract infection) during pregnancy 05/19/2012  . Constipation in pregnancy 05/19/2012  . OBESITY 10/31/2007  . DEPRESSION 10/31/2007  . ALLERGIC RHINITIS, SEASONAL 10/31/2007  . FIBROCYSTIC BREAST DISEASE 10/31/2007   Past Medical History: Past Medical History  Diagnosis Date  . Hypertension   . Migraines   . Preterm delivery   . Depression   . UTI (lower urinary tract infection)   . Obesity   . Seasonal rhinitis   . Fibrocystic breast changes     Past Surgical History: Past Surgical History  Procedure Laterality Date  . Cholecystectomy    . Tonsillectomy      Obstetrical History: OB History   Grav Para Term Preterm Abortions TAB SAB Ect Mult Living   3 1  1 1  1   1        Social History: History   Social History  . Marital Status: Married    Spouse Name: N/A    Number of Children: N/A  . Years of Education: N/A   Social History Main Topics  . Smoking status: Never Smoker   . Smokeless tobacco: Never Used  . Alcohol Use: No  . Drug Use: No  .  Sexually Active: Not Currently    Birth Control/ Protection: None   Other Topics Concern  . None   Social History Narrative  . None    Family History: Family History  Problem Relation Age of Onset  . Hypertension Mother   . Hypertension Father   . Hypertension Maternal Grandmother   . Hypertension Maternal Grandfather   . Diabetes Paternal Grandmother   . Hypertension Paternal Grandmother   . Heart disease Paternal Grandfather   . Diabetes Paternal Grandfather   . Hypertension Paternal Grandfather     Allergies: Allergies  Allergen Reactions  . Codeine Other (See Comments)    Does not like the way it makes her feel.  . Dilaudid (Hydromorphone Hcl) Hives and Itching    Prescriptions prior to admission  Medication Sig Dispense Refill  . hydroxyprogesterone caproate (DELALUTIN) 250 mg/mL OIL Inject 250 mg into the muscle once a week. Patient receives injection on Tuesdays.      . Prenatal Vit-Fe Fumarate-FA (PRENATAL MULTIVITAMIN) TABS Take 1 tablet by mouth daily.      Marland Kitchen NIFEdipine (PROCARDIA-XL/ADALAT-CC/NIFEDICAL-XL) 30 MG 24 hr tablet Take 1 tablet (30 mg total) by mouth daily.  20 tablet  0    Review of Systems - Negative except as above in HPI  Vitals:  BP 134/87  Pulse 98  Temp(Src) 97.5 F (36.4 C) (Oral)  Resp 18 Physical  Examination:  General appearance - alert, well appearing, and in no distress Heart: regular rate and rhythm, normal S1/S2 Lungs: clear to auscultation bilaterally Abdomen: gravid and non-tender and fundal height  is size equals dates Extremities: edema moderate  Membranes:ruptured per amnisure  Fetal Monitoring:Baseline: 150 bpm, Variability: Good {> 6 bpm) and Accelerations: Reactive  Sterile speculum:  Small amount of fluid, moderate amount of mucous, no blood. No pooling. Cervical exam: deferred because of ROM. Closed on visual inspection.  Labs:  Results for orders placed during the hospital encounter of 10/03/12 (from the past  24 hour(s))  URINALYSIS, ROUTINE W REFLEX MICROSCOPIC   Collection Time    10/03/12  4:04 PM      Result Value Range   Color, Urine YELLOW  YELLOW   APPearance CLEAR  CLEAR   Specific Gravity, Urine 1.025  1.005 - 1.030   pH 6.5  5.0 - 8.0   Glucose, UA NEGATIVE  NEGATIVE mg/dL   Hgb urine dipstick NEGATIVE  NEGATIVE   Bilirubin Urine NEGATIVE  NEGATIVE   Ketones, ur NEGATIVE  NEGATIVE mg/dL   Protein, ur NEGATIVE  NEGATIVE mg/dL   Urobilinogen, UA 0.2  0.0 - 1.0 mg/dL   Nitrite NEGATIVE  NEGATIVE   Leukocytes, UA NEGATIVE  NEGATIVE  WET PREP, GENITAL   Collection Time    10/03/12  5:03 PM      Result Value Range   Yeast Wet Prep HPF POC NONE SEEN  NONE SEEN   Trich, Wet Prep NONE SEEN  NONE SEEN   Clue Cells Wet Prep HPF POC NONE SEEN  NONE SEEN   WBC, Wet Prep HPF POC MANY (*) NONE SEEN  AMNISURE RUPTURE OF MEMBRANE (ROM)   Collection Time    10/03/12  5:24 PM      Result Value Range   Amnisure ROM POSITIVE     FERN:  negative  Imaging Studies: No results found.  FHTs:  135-140, mod var, accels present, no decels TOCO:  No ctx visible on monitor    I have reviewed the patient's current medications.  ASSESSMENT/PLAN: Patient Active Problem List   Diagnosis Date Noted  . Supervision of high-risk pregnancy 08/28/2012  . History of preterm delivery, currently pregnant 07/02/2012  . UTI (urinary tract infection) during pregnancy 05/19/2012  . Constipation in pregnancy 05/19/2012  . OBESITY 10/31/2007  . DEPRESSION 10/31/2007  . ALLERGIC RHINITIS, SEASONAL 10/31/2007  . FIBROCYSTIC BREAST DISEASE 10/31/2007   -Positive amnisure before 34wks. -First dose Betamethasone now.  - Ultrasound for AFI -GBS culture pending -Will admit, complete BMZ, latency antibiotics, monitor fluid leakage and FHTs q shift.  No Mag as not contracting and > 32 weeks. -Re-evaluate tomorrow (34 weeks) for possible induction  Napoleon Form, MD

## 2012-10-03 NOTE — Progress Notes (Signed)
Dr Waynetta Sandy notified of patient, her presenting complains of lower abdominal and back with one time gush small fluid. Will come to MAU to evaluate patient

## 2012-10-04 DIAGNOSIS — O4290 Premature rupture of membranes, unspecified as to length of time between rupture and onset of labor, unspecified weeks of gestation: Secondary | ICD-10-CM

## 2012-10-04 LAB — CBC
HCT: 35.3 % — ABNORMAL LOW (ref 36.0–46.0)
Hemoglobin: 12.2 g/dL (ref 12.0–15.0)
MCH: 30.2 pg (ref 26.0–34.0)
MCHC: 34.6 g/dL (ref 30.0–36.0)
MCV: 87.4 fL (ref 78.0–100.0)

## 2012-10-04 LAB — GC/CHLAMYDIA PROBE AMP
CT Probe RNA: NEGATIVE
GC Probe RNA: NEGATIVE

## 2012-10-04 NOTE — H&P (Signed)
Attestation of Attending Supervision of Advanced Practitioner (CNM/NP): Evaluation and management procedures were performed by the Advanced Practitioner under my supervision and collaboration.  I have reviewed the Advanced Practitioner's note and chart, and I agree with the management and plan.  Flemon Kelty 10/04/2012 3:08 PM

## 2012-10-04 NOTE — Discharge Summary (Signed)
Antenatal Physician Discharge Summary  Patient ID: [name] MRN: [id number] DOB/AGE: Nov 04, 1985 26 y.o.  Admit date: 10/03/2012 Discharge date: 10/04/2012  Admission Diagnosis: Possible premature preterm rupture of membranes (PPROM) at [redacted]w[redacted]d  Discharge Diagnosis: No further evidence of premature preterm rupture of membranes at [redacted]w[redacted]d  Prenatal Procedures: NST and ultrasound  Significant Diagnostic Studies:  Results for orders placed during the hospital encounter of 10/03/12 (from the past 168 hour(s))  URINALYSIS, ROUTINE W REFLEX MICROSCOPIC   Collection Time    10/03/12  4:04 PM      Result Value Range   Color, Urine YELLOW  YELLOW   APPearance CLEAR  CLEAR   Specific Gravity, Urine 1.025  1.005 - 1.030   pH 6.5  5.0 - 8.0   Glucose, UA NEGATIVE  NEGATIVE mg/dL   Hgb urine dipstick NEGATIVE  NEGATIVE   Bilirubin Urine NEGATIVE  NEGATIVE   Ketones, ur NEGATIVE  NEGATIVE mg/dL   Protein, ur NEGATIVE  NEGATIVE mg/dL   Urobilinogen, UA 0.2  0.0 - 1.0 mg/dL   Nitrite NEGATIVE  NEGATIVE   Leukocytes, UA NEGATIVE  NEGATIVE  WET PREP, GENITAL   Collection Time    10/03/12  5:03 PM      Result Value Range   Yeast Wet Prep HPF POC NONE SEEN  NONE SEEN   Trich, Wet Prep NONE SEEN  NONE SEEN   Clue Cells Wet Prep HPF POC NONE SEEN  NONE SEEN   WBC, Wet Prep HPF POC MANY (*) NONE SEEN  AMNISURE RUPTURE OF MEMBRANE (ROM)   Collection Time    10/03/12  5:24 PM      Result Value Range   Amnisure ROM POSITIVE    CBC   Collection Time    10/04/12  5:49 AM      Result Value Range   WBC 12.1 (*) 4.0 - 10.5 K/uL   RBC 4.04  3.87 - 5.11 MIL/uL   Hemoglobin 12.2  12.0 - 15.0 g/dL   HCT 11.9 (*) 14.7 - 82.9 %   MCV 87.4  78.0 - 100.0 fL   MCH 30.2  26.0 - 34.0 pg   MCHC 34.6  30.0 - 36.0 g/dL   RDW 56.2  13.0 - 86.5 %   Platelets 256  150 - 400 K/uL  Results for orders placed in visit on 10/02/12 (from the past 168 hour(s))  POCT URINALYSIS DIPSTICK   Collection Time   10/02/12  1:43 PM      Result Value Range   Color, UA       Clarity, UA       Glucose, UA neg     Bilirubin, UA       Ketones, UA 2+     Spec Grav, UA       Blood, UA neg     pH, UA       Protein, UA neg     Urobilinogen, UA       Nitrite, UA neg     Leukocytes, UA Negative    Results for orders placed during the hospital encounter of 09/27/12 (from the past 168 hour(s))  URINALYSIS, ROUTINE W REFLEX MICROSCOPIC   Collection Time    09/27/12  3:40 PM      Result Value Range   Color, Urine YELLOW  YELLOW   APPearance CLEAR  CLEAR   Specific Gravity, Urine 1.020  1.005 - 1.030   pH 6.0  5.0 - 8.0   Glucose, UA NEGATIVE  NEGATIVE mg/dL   Hgb urine dipstick NEGATIVE  NEGATIVE   Bilirubin Urine NEGATIVE  NEGATIVE   Ketones, ur NEGATIVE  NEGATIVE mg/dL   Protein, ur NEGATIVE  NEGATIVE mg/dL   Urobilinogen, UA 1.0  0.0 - 1.0 mg/dL   Nitrite NEGATIVE  NEGATIVE   Leukocytes, UA MODERATE (*) NEGATIVE  URINE MICROSCOPIC-ADD ON   Collection Time    09/27/12  3:40 PM      Result Value Range   Squamous Epithelial / LPF MANY (*) RARE   WBC, UA 3-6  <3 WBC/hpf   Bacteria, UA RARE  RARE    10/03/2012    LIMITED OBSTETRIC ULTRASOUND  Clinical Data:  Pelvic pain and leaking fluid.Number of Fetuses: 1 Heart Rate: 156bpm Movement:  Present Presentation: Cephalic Placental Location: Posterior Previa: No Amniotic Fluid (Subjective): Normal Vertical pocket:  4.9cm   AFI: 10.6cm (5%ile  8.1cm, 95%ile 24.8cm)  BPD:  8.77cm   33w    6d       EDC: 11/15/2012  IMPRESSION: Single living intrauterine fetus estimated at 33 weeks and 6 days gestation.  The fetus is in a cephalic presentation with normal amniotic fluid volume.  This exam is performed on an emergent basis and does not comprehensively evaluate fetal size, dating, or anatomy, and a follow-up complete OB US should be considered if further fetal assessment is warranted.   Original Report Authenticated By: Rudie Meyer, M.D.   Treatments: IV  hydration, antibiotics: ampicillin and steroids: betamethasone  Hospital Course:  This is a 27 y.o. [id number] with IUP at [redacted]w[redacted]d admitted for concern of PPROM. Her exam showed no pooling, or ferning but had a positive Amnisure test.  She was started on betamethasone regimen and antibiotics. Ultrasound showed AFI of 10.6 cm.  Patient had no contractions or bleeding; had good fetal movement.  She did not have further leaking of fluid during her observation.  On the day of discharge, sterile speculum exam was repeated and was remarkable for negative pooling, ferning and nitrazine and cervix was visually closed.  The fetal heart rate monitoring remained reassuring, and she had no signs/symptoms of progressing PPROM, preterm labor or other maternal-fetal concerns.  Her cervical exam was unchanged from admission.  She was deemed stable for discharge to home with outpatient follow up at [facility].  Discharge Exam: BP 118/55  Pulse 73  Temp(Src) 97.6 F (36.4 C) (Oral)  Resp 20  Ht 5\' 3"  (1.6 m)  Wt 250 lb (113.399 kg)  BMI 44.3 kg/m2 General appearance: alert and no distress GI: soft, non-tender; gravid Pelvic: cervix normal in appearance, external genitalia normal, no cervical motion tenderness and no pooling of fluid, negative nitrazine and ferning Extremities: extremities normal, atraumatic, no cyanosis or edema and Homans sign is negative, no sign of DVT  Discharge Condition: Stable  Disposition: 01-Home or Self Care       Future Appointments Provider Department Dept Phone   10/09/2012 2:45 PM Cheron Every [place], [place] [facility] [phone]       Medication List         hydroxyprogesterone caproate 250 mg/mL Oil  Commonly known as:  DELALUTIN  Inject 250 mg into the muscle once a week. Patient receives injection on Tuesdays.     NIFEdipine 30 MG 24 hr tablet  Commonly known as:  PROCARDIA-XL/ADALAT-CC/NIFEDICAL-XL  Take 1 tablet (30 mg total) by mouth daily.      prenatal multivitamin Tabs  Take 1 tablet by mouth daily.  Follow-up Information   Follow up with [facility] On 10/09/2012. (As scheduled.  Come to MAU for any concerning symptoms)    Contact information:   [address] [place] [address] [phone]      Signed: Tereso Newcomer M.D. 10/04/2012, 11:23 AM

## 2012-10-04 NOTE — Progress Notes (Signed)
UR completed 

## 2012-10-05 ENCOUNTER — Encounter (HOSPITAL_COMMUNITY): Payer: Self-pay

## 2012-10-05 ENCOUNTER — Inpatient Hospital Stay (HOSPITAL_COMMUNITY)
Admission: AD | Admit: 2012-10-05 | Discharge: 2012-10-08 | DRG: 372 | Disposition: A | Discharge status: Home / Self Care | Payer: BC Managed Care – PPO | Source: Ambulatory Visit | Attending: Obstetrics & Gynecology | Admitting: Obstetrics & Gynecology

## 2012-10-05 DIAGNOSIS — O4290 Premature rupture of membranes, unspecified as to length of time between rupture and onset of labor, unspecified weeks of gestation: Principal | ICD-10-CM | POA: Diagnosis present

## 2012-10-05 DIAGNOSIS — O0993 Supervision of high risk pregnancy, unspecified, third trimester: Secondary | ICD-10-CM

## 2012-10-05 DIAGNOSIS — E669 Obesity, unspecified: Secondary | ICD-10-CM | POA: Diagnosis present

## 2012-10-05 DIAGNOSIS — O99214 Obesity complicating childbirth: Secondary | ICD-10-CM | POA: Diagnosis present

## 2012-10-05 LAB — TYPE AND SCREEN: Antibody Screen: NEGATIVE

## 2012-10-05 LAB — AMNISURE RUPTURE OF MEMBRANE (ROM) NOT AT ARMC: Amnisure ROM: POSITIVE

## 2012-10-05 LAB — CBC
MCH: 30.5 pg (ref 26.0–34.0)
Platelets: 271 10*3/uL (ref 150–400)
WBC: 17.6 10*3/uL — ABNORMAL HIGH (ref 4.0–10.5)

## 2012-10-05 LAB — URINALYSIS, ROUTINE W REFLEX MICROSCOPIC
Bilirubin Urine: NEGATIVE
Glucose, UA: NEGATIVE mg/dL
Hgb urine dipstick: NEGATIVE
Ketones, ur: NEGATIVE mg/dL
Protein, ur: NEGATIVE mg/dL

## 2012-10-05 LAB — GROUP B STREP BY PCR: Group B strep by PCR: POSITIVE — AB

## 2012-10-05 MED ORDER — TERBUTALINE SULFATE 1 MG/ML IJ SOLN: 0.2500 mg | Freq: Once | INTRAMUSCULAR | Status: AC | PRN

## 2012-10-05 MED ORDER — IBUPROFEN 600 MG PO TABS: 600.0000 mg | ORAL_TABLET | Freq: Four times a day (QID) | ORAL | Status: DC | PRN

## 2012-10-05 MED ORDER — LACTATED RINGERS IV SOLN: 500.0000 mL | INTRAVENOUS | Status: DC | PRN

## 2012-10-05 MED ORDER — OXYTOCIN BOLUS FROM INFUSION
500.0000 mL | INTRAVENOUS | Status: DC
  Administered 2012-10-06: 500 mL via INTRAVENOUS

## 2012-10-05 MED ORDER — OXYTOCIN 40 UNITS IN LACTATED RINGERS INFUSION - SIMPLE MED
62.5000 mL/h | INTRAVENOUS | Status: DC
  Filled 2012-10-05: qty 1000

## 2012-10-05 MED ORDER — CITRIC ACID-SODIUM CITRATE 334-500 MG/5ML PO SOLN: 30.0000 mL | ORAL | Status: DC | PRN

## 2012-10-05 MED ORDER — MISOPROSTOL 25 MCG QUARTER TABLET
25.0000 ug | ORAL_TABLET | ORAL | Status: DC | PRN
  Administered 2012-10-05 (×2): 25 ug via VAGINAL
  Filled 2012-10-05: qty 1
  Filled 2012-10-05 (×3): qty 0.25

## 2012-10-05 MED ORDER — PENICILLIN G POTASSIUM 5000000 UNITS IJ SOLR
5.0000 10*6.[IU] | Freq: Once | INTRAVENOUS | Status: AC
  Administered 2012-10-05: 5 10*6.[IU] via INTRAVENOUS
  Filled 2012-10-05: qty 5

## 2012-10-05 MED ORDER — LIDOCAINE HCL (PF) 1 % IJ SOLN
30.0000 mL | INTRAMUSCULAR | Status: DC | PRN
  Filled 2012-10-05 (×2): qty 30

## 2012-10-05 MED ORDER — ONDANSETRON HCL 4 MG/2ML IJ SOLN: 4.0000 mg | Freq: Four times a day (QID) | INTRAMUSCULAR | Status: DC | PRN

## 2012-10-05 MED ORDER — LACTATED RINGERS IV SOLN
INTRAVENOUS | Status: DC
  Administered 2012-10-06 (×3): via INTRAVENOUS

## 2012-10-05 MED ORDER — ACETAMINOPHEN 325 MG PO TABS: 650.0000 mg | ORAL_TABLET | ORAL | Status: DC | PRN

## 2012-10-05 MED ORDER — PENICILLIN G POTASSIUM 5000000 UNITS IJ SOLR
2.5000 10*6.[IU] | INTRAVENOUS | Status: DC
  Administered 2012-10-06 (×5): 2.5 10*6.[IU] via INTRAVENOUS
  Filled 2012-10-05 (×8): qty 2.5

## 2012-10-05 NOTE — Progress Notes (Addendum)
AUNISTY REALI is a 27 y.o. [phone] at [redacted]w[redacted]d by ultrasound admitted for induction of labor due to PPROM.  Subjective: Doing well but hurting more, mostly in back  Objective: BP 117/69  Pulse 66  Temp(Src) 98.4 F (36.9 C) (Oral)  Resp 18  Ht 5\' 3"  (1.6 m)  Wt 113.399 kg (250 lb)  BMI 44.3 kg/m2      FHT:  FHR: 140 bpm, variability: moderate,  accelerations:  Present,  decelerations:  Absent UC:   irregular, every 6 minutes SVE:   Dilation: 1.5 Effacement (%): 80 Station: -3 Exam by:: D. Poe CNM   Labs: Lab Results  Component Value Date   WBC 17.6* 10/05/2012   HGB 12.5 10/05/2012   HCT 36.0 10/05/2012   MCV 87.8 10/05/2012   PLT 271 10/05/2012    Assessment / Plan: Induction of labor due to PROM,  progressing well on pitocin Bedside US confirms vertex presentation  Labor: Progressing normally Preeclampsia:  n/a Fetal Wellbeing:  Category I Pain Control:  Labor support without medications I/D:  n/a Anticipated MOD:  NSVD  WILLIAMS,MARIE 10/05/2012, 9:24 PM

## 2012-10-05 NOTE — MAU Note (Signed)
Called to speak to Dr. Penne Lash about patient involved in a delivery asked her to call me when available.

## 2012-10-05 NOTE — Consult Note (Signed)
[facility]  Neonatal Medicine Consultation       10/05/2012    11:32 PM  I was called at the request of the patient's obstetrician (Dr. Penne Lash) to speak to this patient due to impending preterm delivery at 34 weeks.  She ruptured her membranes yesterday when thought to be 33 6/7 weeks, although mom reports leaking small amounts of clear fluid for the past 3 days. She has had a 35-week birth that required about 2 weeks in our NICU.  Her current pregnancy is complicated by UTI, obesity, depression.  Induction of labor recommended by her OB's at [redacted] weeks gestation (today).    I reviewed expectations for a 34-35 week delivery, ie, NICU hospitalization for 2-4 weeks, possible respiratory distress requiring supplemental oxygen or ventilator, immature feeding requiring gavage, infection.  Survival should be excellent, and we would expect the baby to be normal (although at mild risk of neurodevelopmental delay due to premature birth).  She had a few questions which I answered.  We will attend her delivery and provide care for the baby boy when called.   _____________________ Electronically Signed By: Angelita Ingles, MD Neonatologist

## 2012-10-05 NOTE — MAU Note (Signed)
Patient states some clear fluid not wearing a pad, cramping, headache since noon.

## 2012-10-05 NOTE — MAU Note (Signed)
Patient states she has been leaking clear fluid since 1200. Not currently leaking and not wearing a pad. States she is having some cramping and reports good fetal movement.

## 2012-10-05 NOTE — MAU Provider Note (Signed)
Chief Complaint:  Headache, Vaginal Discharge and Abdominal Cramping   First Provider Initiated Contact with Patient 10/05/12 1638      HPI: [name] is a 27 y.o. [phone] at [redacted]w[redacted]d who presents to maternity admissions due to continual leaking of small spurts of clear fluid per vagina since discharged from [facility] 10/03/2012. She has mild lower abdominal cramping. No vaginal bleed Good fetal movement.   P[facility] Course: [facility]. Receiving 17 P due to history PPROM and preterm birth at 69 weeks  Past Medical History: Past Medical History  Diagnosis Date  . Hypertension   . Migraines   . Preterm delivery   . Depression   . UTI (lower urinary tract infection)   . Obesity   . Seasonal rhinitis   . Fibrocystic breast changes     Past obstetric history: OB History   Grav Para Term Preterm Abortions TAB SAB Ect Mult Living   3 1  1 1  1   1      # Outc Date GA Lbr Len/2nd Wgt Sex Del Anes PTL Lv   1 PRE 2008 [redacted]w[redacted]d  4lb5.2oz(1.962kg) F    Yes   2 SAB 11/13 6d          3 CUR               Past Surgical History: Past Surgical History  Procedure Laterality Date  . Cholecystectomy    . Tonsillectomy      Family History: Family History  Problem Relation Age of Onset  . Hypertension Mother   . Hypertension Father   . Hypertension Maternal Grandmother   . Hypertension Maternal Grandfather   . Diabetes Paternal Grandmother   . Hypertension Paternal Grandmother   . Heart disease Paternal Grandfather   . Diabetes Paternal Grandfather   . Hypertension Paternal Grandfather     Social History: History  Substance Use Topics  . Smoking status: Never Smoker   . Smokeless tobacco: Never Used  . Alcohol Use: No    Allergies:  Allergies  Allergen Reactions  . Codeine Other (See Comments)    Does not like the way it makes her feel.  . Dilaudid (Hydromorphone Hcl) Hives and Itching    Meds:  Prescriptions prior to admission  Medication Sig Dispense  Refill  . hydroxyprogesterone caproate (DELALUTIN) 250 mg/mL OIL Inject 250 mg into the muscle once a week. Patient receives injection on Tuesdays.      Marland Kitchen NIFEdipine (PROCARDIA-XL/ADALAT-CC/NIFEDICAL-XL) 30 MG 24 hr tablet Take 1 tablet (30 mg total) by mouth daily.  20 tablet  0  . Prenatal Vit-Fe Fumarate-FA (PRENATAL MULTIVITAMIN) TABS Take 1 tablet by mouth daily.        ROS: Headache earlier but took Tylenol prior to arrival and it is resolved  Physical Exam  Blood pressure 126/71, pulse 106, temperature 96.9 F (36.1 C), resp. rate 20.   For complete H&P see admission note GENERAL: Well-developed, well-nourished female in no acute distress.  FHT:  Baseline 140-145 , moderate variability, accelerations present, no decelerations Contractions: slight UI Cx:1.5/80/-3 vtx   Labs: No results found for this or any previous visit (from the past 24 hour(s)).  Imaging:  US Ob Limited  10/03/2012   *RADIOLOGY REPORT*  Clinical Data:  Pelvic pain and leaking fluid.  LIMITED OBSTETRIC ULTRASOUND  Number of Fetuses: 1 Heart Rate: 156bpm Movement:  Present Presentation: Cephalic Placental Location: Posterior Previa: No Amniotic Fluid (Subjective): Normal Vertical pocket:  4.9cm  AFI: 10.6cm (5%ile  8.1cm, 95%ile 24.8cm)  BPD:  8.77cm   35w    3d       EDC: 11/04/2012  IMPRESSION: Single living intrauterine fetus estimated at 35 weeks and 3 days gestation.  The fetus is in a cephalic presentation with normal amniotic fluid volume.  This exam is performed on an emergent basis and does not comprehensively evaluate fetal size, dating, or anatomy, and a follow-up complete OB US should be considered if further fetal assessment is warranted.   Original Report Authenticated By: Rudie Meyer, M.D.   Results for orders placed during the hospital encounter of 10/05/12 (from the past 24 hour(s))  URINALYSIS, ROUTINE W REFLEX MICROSCOPIC     Status: None   Collection Time    10/05/12  3:50 PM      Result  Value Range   Color, Urine YELLOW  YELLOW   APPearance CLEAR  CLEAR   Specific Gravity, Urine 1.020  1.005 - 1.030   pH 6.5  5.0 - 8.0   Glucose, UA NEGATIVE  NEGATIVE mg/dL   Hgb urine dipstick NEGATIVE  NEGATIVE   Bilirubin Urine NEGATIVE  NEGATIVE   Ketones, ur NEGATIVE  NEGATIVE mg/dL   Protein, ur NEGATIVE  NEGATIVE mg/dL   Urobilinogen, UA 0.2  0.0 - 1.0 mg/dL   Nitrite NEGATIVE  NEGATIVE   Leukocytes, UA NEGATIVE  NEGATIVE  AMNISURE RUPTURE OF MEMBRANE (ROM)     Status: None   Collection Time    10/05/12  4:25 PM      Result Value Range   Amnisure ROM POSITIVE       Assessment: 1. PROM (premature rupture of membranes)   [redacted]w[redacted]d  Plan: Admit for IOL [name], CNM 10/05/2012 4:43 PM

## 2012-10-05 NOTE — H&P (Signed)
[name] is a 27 y.o. female presenting at 34w1 for leaking small amounts clear fluid pv x 3 days. Maternal Medical History:  Reason for admission: Rupture of membranes.  Nausea.   She was admitted here 10/03/2012 reporting abdominal cramping for about a week and having one episode of a small gush of clear fluid. Amnisure was positive. She was admitted and received betamethasone x2. AFI was 10.6 And she did not report continued leakage. On 10/04/2012 She had a repeat sterile speculum exam significant for visually long cervix with negative pooling, negative ferning. She was then discharged home with precautions.  Pregnancy course: [facility].Obesity. History of preterm birth receiving weekly 17-P. Preterm contractions this pregnancy on Procardia but not taking regularly  OB History   Grav Para Term Preterm Abortions TAB SAB Ect Mult Living   3 1  1 1  1   1      Past Medical History  Diagnosis Date  . Hypertension   . Migraines   . Preterm delivery   . Depression   . UTI (lower urinary tract infection)   . Obesity   . Seasonal rhinitis   . Fibrocystic breast changes    Past Surgical History  Procedure Laterality Date  . Cholecystectomy    . Tonsillectomy     Family History: family history includes Diabetes in her paternal grandfather and paternal grandmother; Heart disease in her paternal grandfather; and Hypertension in her father, maternal grandfather, maternal grandmother, mother, paternal grandfather, and paternal grandmother. Social History:  reports that she has never smoked. She has never used smokeless tobacco. She reports that she does not drink alcohol or use illicit drugs.   Prenatal Transfer Tool  Maternal Diabetes: No Genetic Screening: Normal Maternal Ultrasounds/Referrals: Normal Fetal Ultrasounds or other Referrals:  None Maternal Substance Abuse:  No Significant Maternal Medications:  None Significant Maternal Lab Results:  Lab values  include: Group B Strep negative Other Comments:  None  Review of Systems  Constitutional: Negative for fever and chills.  Eyes: Negative for blurred vision.  Respiratory: Negative for cough.   Gastrointestinal: Positive for abdominal pain and constipation. Negative for nausea and vomiting.  Genitourinary: Negative for dysuria.  Skin: Negative for rash.  Neurological: Positive for headaches. Negative for dizziness.  Psychiatric/Behavioral: Negative for depression. The patient is nervous/anxious.     Dilation: 1.5 Effacement (%): 80 Station: -3 Exam by:: D. Papa Piercefield CNM  Blood pressure 126/71, pulse 106, temperature 96.9 F (36.1 C), resp. rate 20. Maternal Exam:  Uterine Assessment: Contraction strength is mild.  Contraction frequency is rare.   Introitus: Normal vulva. Normal vagina.  Vagina is negative for discharge.  Pelvis: adequate for delivery.   Cervix: Cervix evaluated by digital exam.     Fetal Exam Fetal Monitor Review: Mode: ultrasound.   Baseline rate: 140.  Variability: moderate (6-25 bpm).   Pattern: accelerations present and no decelerations.    Fetal State Assessment: Category I - tracings are normal.     Physical Exam  Constitutional: She is oriented to person, place, and time. She appears well-developed and well-nourished. No distress.  HENT:  Head: Normocephalic.  Eyes: Pupils are equal, round, and reactive to light.  Neck: Neck supple.  Cardiovascular: Normal rate, regular rhythm and normal heart sounds.   Respiratory: Effort normal and breath sounds normal.  GI: Soft. There is no tenderness.  Genitourinary: Vagina normal and uterus normal. No vaginal discharge found.  Musculoskeletal: Normal range of motion.  Neurological: She is  alert and oriented to person, place, and time. She has normal reflexes.  Skin: Skin is warm and dry.  Psychiatric: She has a normal mood and affect.    Prenatal labs: ABO, Rh: A/Negative/-- (12/23 0000) Antibody: NEG  (05/27 0902) Rubella: Immune (12/23 0000) RPR: NON REAC (05/27 0902)  HBsAg: Negative (12/23 0000)  HIV: NON REACTIVE (05/27 0902)  GBS:   10/03/12: result pending  Results for orders placed during the hospital encounter of 10/05/12 (from the past 24 hour(s))  URINALYSIS, ROUTINE W REFLEX MICROSCOPIC     Status: None   Collection Time    10/05/12  3:50 PM      Result Value Range   Color, Urine YELLOW  YELLOW   APPearance CLEAR  CLEAR   Specific Gravity, Urine 1.020  1.005 - 1.030   pH 6.5  5.0 - 8.0   Glucose, UA NEGATIVE  NEGATIVE mg/dL   Hgb urine dipstick NEGATIVE  NEGATIVE   Bilirubin Urine NEGATIVE  NEGATIVE   Ketones, ur NEGATIVE  NEGATIVE mg/dL   Protein, ur NEGATIVE  NEGATIVE mg/dL   Urobilinogen, UA 0.2  0.0 - 1.0 mg/dL   Nitrite NEGATIVE  NEGATIVE   Leukocytes, UA NEGATIVE  NEGATIVE  AMNISURE RUPTURE OF MEMBRANE (ROM)     Status: None   Collection Time    10/05/12  4:25 PM      Result Value Range   Amnisure ROM POSITIVE     Assessment/Plan: G3 P0111 at 34 week 1 day with PPPROM> admit for IOL Cytotec GBS by PCR  [name] 10/05/2012, 5:27 PM

## 2012-10-06 ENCOUNTER — Encounter (HOSPITAL_COMMUNITY): Payer: Self-pay | Admitting: Anesthesiology

## 2012-10-06 ENCOUNTER — Inpatient Hospital Stay (HOSPITAL_COMMUNITY): Payer: BC Managed Care – PPO | Admitting: Anesthesiology

## 2012-10-06 ENCOUNTER — Encounter (HOSPITAL_COMMUNITY): Payer: Self-pay | Admitting: *Deleted

## 2012-10-06 DIAGNOSIS — E669 Obesity, unspecified: Secondary | ICD-10-CM

## 2012-10-06 DIAGNOSIS — O99214 Obesity complicating childbirth: Secondary | ICD-10-CM

## 2012-10-06 DIAGNOSIS — O4290 Premature rupture of membranes, unspecified as to length of time between rupture and onset of labor, unspecified weeks of gestation: Secondary | ICD-10-CM

## 2012-10-06 MED ORDER — BISACODYL 10 MG RE SUPP: 10.0000 mg | Freq: Every day | RECTAL | Status: DC | PRN

## 2012-10-06 MED ORDER — SENNOSIDES-DOCUSATE SODIUM 8.6-50 MG PO TABS
2.0000 | ORAL_TABLET | Freq: Every day | ORAL | Status: DC
  Administered 2012-10-06 – 2012-10-07 (×2): 2 via ORAL

## 2012-10-06 MED ORDER — TETANUS-DIPHTH-ACELL PERTUSSIS 5-2.5-18.5 LF-MCG/0.5 IM SUSP
0.5000 mL | Freq: Once | INTRAMUSCULAR | Status: AC
  Administered 2012-10-07: 0.5 mL via INTRAMUSCULAR
  Filled 2012-10-06: qty 0.5

## 2012-10-06 MED ORDER — SIMETHICONE 80 MG PO CHEW: 80.0000 mg | CHEWABLE_TABLET | ORAL | Status: DC | PRN

## 2012-10-06 MED ORDER — SODIUM CHLORIDE 0.9 % IJ SOLN
3.0000 mL | Freq: Two times a day (BID) | INTRAMUSCULAR | Status: DC
  Administered 2012-10-06: 3 mL via INTRAVENOUS

## 2012-10-06 MED ORDER — FENTANYL 2.5 MCG/ML BUPIVACAINE 1/10 % EPIDURAL INFUSION (WH - ANES)
INTRAMUSCULAR | Status: DC | PRN
  Administered 2012-10-06: 14 mL/h via EPIDURAL

## 2012-10-06 MED ORDER — WITCH HAZEL-GLYCERIN EX PADS: 1.0000 "application " | MEDICATED_PAD | CUTANEOUS | Status: DC | PRN

## 2012-10-06 MED ORDER — PHENYLEPHRINE 40 MCG/ML (10ML) SYRINGE FOR IV PUSH (FOR BLOOD PRESSURE SUPPORT)
80.0000 ug | PREFILLED_SYRINGE | INTRAVENOUS | Status: DC | PRN
  Filled 2012-10-06: qty 2

## 2012-10-06 MED ORDER — OXYCODONE-ACETAMINOPHEN 5-325 MG PO TABS: 1.0000 | ORAL_TABLET | ORAL | Status: DC | PRN

## 2012-10-06 MED ORDER — TERBUTALINE SULFATE 1 MG/ML IJ SOLN: 0.2500 mg | Freq: Once | INTRAMUSCULAR | Status: DC | PRN

## 2012-10-06 MED ORDER — DIBUCAINE 1 % RE OINT: 1.0000 "application " | TOPICAL_OINTMENT | RECTAL | Status: DC | PRN

## 2012-10-06 MED ORDER — SODIUM CHLORIDE 0.9 % IJ SOLN: 3.0000 mL | INTRAMUSCULAR | Status: DC | PRN

## 2012-10-06 MED ORDER — OXYTOCIN 40 UNITS IN LACTATED RINGERS INFUSION - SIMPLE MED
1.0000 m[IU]/min | INTRAVENOUS | Status: DC
  Administered 2012-10-06: 4 m[IU]/min via INTRAVENOUS
  Administered 2012-10-06: 2 m[IU]/min via INTRAVENOUS

## 2012-10-06 MED ORDER — LACTATED RINGERS IV SOLN
500.0000 mL | Freq: Once | INTRAVENOUS | Status: AC
  Administered 2012-10-06: 500 mL via INTRAVENOUS

## 2012-10-06 MED ORDER — EPHEDRINE 5 MG/ML INJ
10.0000 mg | INTRAVENOUS | Status: DC | PRN
  Filled 2012-10-06: qty 2
  Filled 2012-10-06: qty 4

## 2012-10-06 MED ORDER — LANOLIN HYDROUS EX OINT: TOPICAL_OINTMENT | CUTANEOUS | Status: DC | PRN

## 2012-10-06 MED ORDER — DIPHENHYDRAMINE HCL 50 MG/ML IJ SOLN: 12.5000 mg | INTRAMUSCULAR | Status: DC | PRN

## 2012-10-06 MED ORDER — PHENYLEPHRINE 40 MCG/ML (10ML) SYRINGE FOR IV PUSH (FOR BLOOD PRESSURE SUPPORT)
80.0000 ug | PREFILLED_SYRINGE | INTRAVENOUS | Status: DC | PRN
  Filled 2012-10-06: qty 5
  Filled 2012-10-06: qty 2

## 2012-10-06 MED ORDER — FLEET ENEMA 7-19 GM/118ML RE ENEM: 1.0000 | ENEMA | Freq: Every day | RECTAL | Status: DC | PRN

## 2012-10-06 MED ORDER — FENTANYL 2.5 MCG/ML BUPIVACAINE 1/10 % EPIDURAL INFUSION (WH - ANES)
14.0000 mL/h | INTRAMUSCULAR | Status: DC | PRN
  Filled 2012-10-06: qty 125

## 2012-10-06 MED ORDER — ZOLPIDEM TARTRATE 5 MG PO TABS: 5.0000 mg | ORAL_TABLET | Freq: Every evening | ORAL | Status: DC | PRN

## 2012-10-06 MED ORDER — SODIUM CHLORIDE 0.9 % IV SOLN: 250.0000 mL | INTRAVENOUS | Status: DC | PRN

## 2012-10-06 MED ORDER — IBUPROFEN 600 MG PO TABS
600.0000 mg | ORAL_TABLET | Freq: Four times a day (QID) | ORAL | Status: DC
  Administered 2012-10-06 – 2012-10-08 (×6): 600 mg via ORAL
  Filled 2012-10-06 (×6): qty 1

## 2012-10-06 MED ORDER — LIDOCAINE HCL (PF) 1 % IJ SOLN
INTRAMUSCULAR | Status: DC | PRN
  Administered 2012-10-06: 3 mL
  Administered 2012-10-06 (×2): 5 mL

## 2012-10-06 MED ORDER — MEASLES, MUMPS & RUBELLA VAC ~~LOC~~ INJ
0.5000 mL | INJECTION | Freq: Once | SUBCUTANEOUS | Status: DC
  Filled 2012-10-06: qty 0.5

## 2012-10-06 MED ORDER — OXYTOCIN 40 UNITS IN LACTATED RINGERS INFUSION - SIMPLE MED: 1.0000 m[IU]/min | INTRAVENOUS | Status: DC

## 2012-10-06 MED ORDER — OXYTOCIN 40 UNITS IN LACTATED RINGERS INFUSION - SIMPLE MED: 62.5000 mL/h | INTRAVENOUS | Status: DC | PRN

## 2012-10-06 MED ORDER — BENZOCAINE-MENTHOL 20-0.5 % EX AERO: 1.0000 "application " | INHALATION_SPRAY | CUTANEOUS | Status: DC | PRN

## 2012-10-06 MED ORDER — ONDANSETRON HCL 4 MG PO TABS: 4.0000 mg | ORAL_TABLET | ORAL | Status: DC | PRN

## 2012-10-06 MED ORDER — PRENATAL MULTIVITAMIN CH
1.0000 | ORAL_TABLET | Freq: Every day | ORAL | Status: DC
  Administered 2012-10-07: 1 via ORAL
  Filled 2012-10-06: qty 1

## 2012-10-06 MED ORDER — ONDANSETRON HCL 4 MG/2ML IJ SOLN: 4.0000 mg | INTRAMUSCULAR | Status: DC | PRN

## 2012-10-06 MED ORDER — EPHEDRINE 5 MG/ML INJ
10.0000 mg | INTRAVENOUS | Status: DC | PRN
  Filled 2012-10-06: qty 2

## 2012-10-06 MED ORDER — DIPHENHYDRAMINE HCL 25 MG PO CAPS: 25.0000 mg | ORAL_CAPSULE | Freq: Four times a day (QID) | ORAL | Status: DC | PRN

## 2012-10-06 NOTE — Progress Notes (Signed)
Patient ID: [name], female   DOB: [birth year], 27 y.o.   MRN: [id number]  Doing well. Time for another cytotec.  Upon exam, cervix is more anterior, soft 1-2/80/-2/vertex  FHR reactive Irregular mild contractions   Decision made to stop cytotec and place Foley balloon.  Foley placed easily  Will start some low dose Pitocin at 42mu/min

## 2012-10-06 NOTE — Anesthesia Procedure Notes (Signed)
Epidural Patient location during procedure: OB  Staffing Anesthesiologist: Gervase Colberg Performed by: anesthesiologist   Preanesthetic Checklist Completed: patient identified, site marked, surgical consent, pre-op evaluation, timeout performed, IV checked, risks and benefits discussed and monitors and equipment checked  Epidural Patient position: sitting Prep: ChloraPrep Patient monitoring: heart rate, continuous pulse ox and blood pressure Approach: midline Injection technique: LOR saline  Needle:  Needle type: Tuohy  Needle gauge: 17 G Needle length: 9 cm and 9 Needle insertion depth: 7 cm Catheter type: closed end flexible Catheter size: 20 Guage Catheter at skin depth: 14 cm Test dose: negative  Assessment Events: blood not aspirated, injection not painful, no injection resistance, negative IV test and no paresthesia  Additional Notes   Patient tolerated the insertion well without complications.   

## 2012-10-06 NOTE — Consult Note (Signed)
Neonatology Note:   Attendance at Delivery:    I was asked by Shawna Clamp, CNM, for Dr. Penne Lash,  to attend this NSVD at 34 2/7 weeks. The mother is a G3P1A1 A neg, GBS pos with PPROM for 4 days and onset of preterm labor. The mother is obese, with a history of hypertension and previous preterm delivery at 35 weeks. She received Betamethasone times 2 doses last week and was on Pen G during labor (3 doses), remaining afebrile. Amniotic fluid clear. Infant vigorous with good spontaneous cry and tone. Needed only minimal bulb suctioning. Ap 8/9. Lungs clear to ausc in DR. Held by parents briefly in the DR, then transported to the NICU in room air.   Doretha Sou, MD

## 2012-10-06 NOTE — Progress Notes (Signed)
JANAYIA BURGGRAF is a 27 y.o. [phone] at [redacted]w[redacted]d admitted for induction of labor due to pprom.  Subjective: Uncomfortable w/ uc's, requesting epidural  Objective: BP 125/61  Pulse 54  Temp(Src) 98 F (36.7 C) (Oral)  Resp 18  Ht 5\' 3"  (1.6 m)  Wt 113.399 kg (250 lb)  BMI 44.3 kg/m2      FHT:  FHR: 150 bpm, variability: moderate,  accelerations:  Present,  decelerations:  Present earlies UC:   regular, every 2 minutes- RN not able to trace on EFM SVE:  5/80/-2, now leaking large amount of clear fluid IUPC placed w/o difficulty, w/ blood flashback in IUPC tubing- IUPC redirected- FHR reassuring  Labs: Lab Results  Component Value Date   WBC 17.6* 10/05/2012   HGB 12.5 10/05/2012   HCT 36.0 10/05/2012   MCV 87.8 10/05/2012   PLT 271 10/05/2012    Assessment / Plan: IOL d/t pprom- was probable forebag b/c now w/ large amount leakage of clear fluid, progressing normally, pitocin at 37mu/min, iupc now in place  Labor: Progressing normally Preeclampsia:  n/a Fetal Wellbeing:  Category I Pain Control:  Labor support without medications and requesting epidural I/D:  pcn for gbs pos Anticipated MOD:  NSVD  Marge Duncans 10/06/2012, 1:45 PM

## 2012-10-06 NOTE — Anesthesia Preprocedure Evaluation (Signed)
Anesthesia Evaluation  Patient identified by MRN, date of birth, ID band Patient awake    Reviewed: Allergy & Precautions, H&P , NPO status , Patient's Chart, lab work & pertinent test results  History of Anesthesia Complications Negative for: history of anesthetic complications  Airway Mallampati: II TM Distance: >3 FB Neck ROM: full    Dental no notable dental hx. (+) Teeth Intact   Pulmonary neg pulmonary ROS,  breath sounds clear to auscultation  Pulmonary exam normal       Cardiovascular negative cardio ROS  Rhythm:regular Rate:Normal     Neuro/Psych negative neurological ROS  negative psych ROS   GI/Hepatic negative GI ROS, Neg liver ROS,   Endo/Other  negative endocrine ROSMorbid obesity  Renal/GU negative Renal ROS  negative genitourinary   Musculoskeletal   Abdominal Normal abdominal exam  (+)   Peds  Hematology negative hematology ROS (+)   Anesthesia Other Findings   Reproductive/Obstetrics (+) Pregnancy                           Anesthesia Physical Anesthesia Plan  ASA: III  Anesthesia Plan: Epidural   Post-op Pain Management:    Induction:   Airway Management Planned:   Additional Equipment:   Intra-op Plan:   Post-operative Plan:   Informed Consent: I have reviewed the patients History and Physical, chart, labs and discussed the procedure including the risks, benefits and alternatives for the proposed anesthesia with the patient or authorized representative who has indicated his/her understanding and acceptance.     Plan Discussed with:   Anesthesia Plan Comments:         Anesthesia Quick Evaluation  

## 2012-10-06 NOTE — Progress Notes (Signed)
[name] is a 27 y.o. [phone] at [redacted]w[redacted]d admitted for induction of labor due to pprom.  Subjective: Uncomfortable w/ uc's, no other complaints. Family supportive at bs.   Objective: BP 120/75  Pulse 61  Temp(Src) 97.7 F (36.5 C) (Oral)  Resp 18  Ht 5\' 3"  (1.6 m)  Wt 113.399 kg (250 lb)  BMI 44.3 kg/m2      FHT:  FHR: 145 bpm, variability: moderate,  accelerations:  Present,  decelerations:  Absent UC:   regular, every 2-5 minutes SVE:   Dilation: 4 Effacement (%): 60 Station: -2;-3 Exam by:: Genella Rife, CNM Foley bulb sitting in vagina, removed  Labs: Lab Results  Component Value Date   WBC 17.6* 10/05/2012   HGB 12.5 10/05/2012   HCT 36.0 10/05/2012   MCV 87.8 10/05/2012   PLT 271 10/05/2012    Assessment / Plan: IOL d/t pprom, foley bulb now out, pitocin currently at 52mu/min- to increase per protocol to achieve adequate labor/dilation  Labor: s/p cervical ripening Preeclampsia:  n/a Fetal Wellbeing:  Category I Pain Control:  Labor support without medications, planning epidural I/D:  pcn for gbs pos Anticipated MOD:  NSVD  Marge Duncans 10/06/2012, 9:47 AM

## 2012-10-07 LAB — RPR: RPR Ser Ql: NONREACTIVE

## 2012-10-07 LAB — CULTURE, BETA STREP (GROUP B ONLY)

## 2012-10-07 NOTE — H&P (Signed)
Attestation of Attending Supervision of Advanced Practitioner (CNM/NP): Evaluation and management procedures were performed by the Advanced Practitioner under my supervision and collaboration. I have reviewed the Advanced Practitioner's note and chart, and I agree with the management and plan.  Eugenio Dollins H. 11:28 AM   

## 2012-10-07 NOTE — MAU Provider Note (Signed)
Attestation of Attending Supervision of Advanced Practitioner (CNM/NP): Evaluation and management procedures were performed by the Advanced Practitioner under my supervision and collaboration. I have reviewed the Advanced Practitioner's note and chart, and I agree with the management and plan.  LEGGETT,KELLY H. 11:28 AM   

## 2012-10-07 NOTE — Progress Notes (Signed)
Post Partum Day 1 Subjective: no complaints, up ad lib, voiding and tolerating PO  Objective: Blood pressure 100/68, pulse 65, temperature 97.5 F (36.4 C), temperature source Oral, resp. rate 18, height 5\' 3"  (1.6 m), weight 250 lb (113.399 kg), SpO2 100.00%, unknown if currently breastfeeding.  Physical Exam:  General: alert, cooperative and no distress Lochia: appropriate Uterine Fundus: firm  DVT Evaluation: No evidence of DVT seen on physical exam.   Recent Labs  10/05/12 1820  HGB 12.5  HCT 36.0    Assessment/Plan: Plan for discharge tomorrow, Breastfeeding and Contraception Nexplanon   LOS: 2 days   EURE,LUTHER H 10/07/2012, 7:10 AM

## 2012-10-07 NOTE — Anesthesia Postprocedure Evaluation (Signed)
  Anesthesia Post-op Note  Patient: [name]  Procedure(s) Performed: * No procedures listed *  Patient Location: [facility]  Anesthesia Type:Epidural  Level of Consciousness: awake  Airway and Oxygen Therapy: Patient Spontanous Breathing  Post-op Pain: mild  Post-op Assessment: Patient's Cardiovascular Status Stable, Respiratory Function Stable, No signs of Nausea or vomiting, Pain level controlled, No headache, No residual numbness and No residual motor weakness  Post-op Vital Signs: Reviewed and stable  Complications: No apparent anesthesia complications

## 2012-10-08 ENCOUNTER — Encounter: Payer: BC Managed Care – PPO | Admitting: Women's Health

## 2012-10-08 ENCOUNTER — Ambulatory Visit: Payer: Self-pay

## 2012-10-08 MED ORDER — IBUPROFEN 600 MG PO TABS: 600.0000 mg | ORAL_TABLET | Freq: Four times a day (QID) | ORAL | Status: DC | PRN

## 2012-10-08 NOTE — Progress Notes (Signed)
Ur chart review completed.  

## 2012-10-08 NOTE — Progress Notes (Signed)
Pt. Is discharged in the care of husband. Stable. Denies any pain ,discomfort or problems. Infant to remain in the NICU. Discharge instructions were given with good understanding. Questions were asked and answered. Stable.

## 2012-10-08 NOTE — Discharge Summary (Signed)
Obstetric Discharge Summary Reason for Admission: rupture of membranes--preterm; induced Prenatal Procedures: NST and ultrasound Intrapartum Procedures: spontaneous vaginal delivery Postpartum Procedures: none Complications-Operative and Postpartum: none Hemoglobin  Date Value Range Status  10/05/2012 12.5  12.0 - 15.0 g/dL Final  63/87/5643 32.9   Final     HCT  Date Value Range Status  10/05/2012 36.0  36.0 - 46.0 % Final  03/26/2012 38   Final    Physical Exam:  General: alert, cooperative and no distress Lochia: appropriate Uterine Fundus: firm Incision: n/a DVT Evaluation: No evidence of DVT seen on physical exam. Negative Homan's sign.  Discharge Diagnoses: PPROM adn induced at 34 weeks 1 day  Discharge Information: Date: 10/08/2012 Activity: pelvic rest Diet: routine Medications: PNV and Ibuprofen Condition: stable Instructions: refer to practice specific booklet Discharge to: home   Newborn Data: Live born female  Birth Weight: 5 lb 10.9 oz (2576 g) APGAR: 8, 9  Home with mother.  Nicole Soledad H. 10/08/2012, 7:15 AM

## 2012-10-08 NOTE — Lactation Note (Signed)
This note was copied from the chart of Boy Soundra Lampley. Lactation Consultation Note   Follow up consult with this mom of a NICU baby, now 40 hours post partum. Mom has ben pumping a few times a day, and she is rooming in with baby tonight. She claims she wants to breast feed, and understands she needs to continue pumping, 8 times a day. She has WIC, so I told her to go there today, and get a DEP. She also has [employer], and will order a DEP, at which time she can return her [facility] pump. Her baby is 45 4/[redacted] weeks gestation, and is rooming in with her tonight. She has never breast fed her baby, so I told her I would see her in the morning, prior to her and baby's discharge.  She knows to bring her pump parts with her, so can can provide EBM to her baby tonight, and maintain her milk supply.  Patient Name: Boy Xavier Fournier [id number] Date: 10/08/2012     Maternal Data    Feeding    LATCH Score/Interventions                      Lactation Tools Discussed/Used     Consult Status      Alfred Levins 10/08/2012, 4:31 PM

## 2012-10-09 ENCOUNTER — Encounter: Payer: BC Managed Care – PPO | Admitting: Women's Health

## 2012-10-09 ENCOUNTER — Encounter: Payer: Self-pay | Admitting: Obstetrics and Gynecology

## 2012-11-05 ENCOUNTER — Telehealth: Payer: Self-pay | Admitting: Adult Health

## 2012-11-05 NOTE — Telephone Encounter (Signed)
Pt states still having vaginal bleeding delivered October 06, 2012, vaginal delivery. Pt informed could have bleeding up to 6 weeks postpartum or could have started her period. Pt  Verbalized understanding.

## 2012-11-07 ENCOUNTER — Telehealth: Payer: Self-pay | Admitting: Adult Health

## 2012-11-07 NOTE — Telephone Encounter (Signed)
Spoke with pt. Had a vaginal delivery on 10/06/12. Still passing clots and heavy bleeding. Bleeding lightened up then got heavy again. Not breastfeeding. Having to change pad every 1 and a half to 2 hours. Has postpartum appt for 11/20/12. Advised this sounds like her period, and to try and give it until Monday to see if things get better. Pt to call back Monday if no better. Pt voiced understanding. JSY

## 2012-11-20 ENCOUNTER — Ambulatory Visit: Payer: BC Managed Care – PPO | Admitting: Adult Health

## 2012-11-27 ENCOUNTER — Encounter: Payer: Self-pay | Admitting: Adult Health

## 2012-11-27 ENCOUNTER — Ambulatory Visit (INDEPENDENT_AMBULATORY_CARE_PROVIDER_SITE_OTHER): Payer: BC Managed Care – PPO | Admitting: Adult Health

## 2012-11-27 DIAGNOSIS — Z3200 Encounter for pregnancy test, result unknown: Secondary | ICD-10-CM

## 2012-11-27 DIAGNOSIS — Z3202 Encounter for pregnancy test, result negative: Secondary | ICD-10-CM

## 2012-11-27 LAB — POCT URINE PREGNANCY: Preg Test, Ur: NEGATIVE

## 2012-11-27 NOTE — Patient Instructions (Addendum)
No sex Come back in 2 days for nexplanon

## 2012-11-27 NOTE — Progress Notes (Signed)
Patient ID: [name], female   DOB: [birth year], 27 y.o.   MRN: [id number] [name] is in for her postpartum check. Delivery Date:10/06/12 Method of Delivery: vaginal delivery boy 5 lbs 11 oz-Collin Delivered by Selena Batten, no stiches  Sexual Activity since delivery: Yes x 1  Method of Feeding: bottle  Number of weeks bleeding post delivery: 4  Review of Systems: Patient denies any headaches, blurred vision, shortness of breath, chest pain, abdominal pain, problems with bowel movements, urination, or intercourse. No joint pain or mood swings Reviewed past medical,surgical, social and family history. Reviewed medications and allergies.  Depression Score: 6 BP 124/82  Ht 5\' 3"  (1.6 m)  Wt 239 lb (108.41 kg)  BMI 42.35 kg/m2  LMP 11/26/2012  Breastfeeding? Nourine pregnancy test negative Pelvic Exam:   External genitalia is normal in appearance.  The vagina is normal in appearance with period like blood. The cervix is bulbous.  Uterus is felt to be normal size, shape, and contour, well involuted.  No adnexal masses or tenderness noted. She wants nexplanon, ok to use tampons.  Impression:  Status post delivery, post partum check, depression screening, contraceptive management.   Plan:   No sex Return in 2 days for nexplanon insertion

## 2012-11-29 ENCOUNTER — Ambulatory Visit (INDEPENDENT_AMBULATORY_CARE_PROVIDER_SITE_OTHER): Payer: BC Managed Care – PPO | Admitting: Adult Health

## 2012-11-29 ENCOUNTER — Encounter: Payer: Self-pay | Admitting: Adult Health

## 2012-11-29 VITALS — BP 120/80 | Ht 63.0 in | Wt 239.0 lb

## 2012-11-29 DIAGNOSIS — Z3200 Encounter for pregnancy test, result unknown: Secondary | ICD-10-CM

## 2012-11-29 DIAGNOSIS — Z3202 Encounter for pregnancy test, result negative: Secondary | ICD-10-CM

## 2012-11-29 DIAGNOSIS — Z30017 Encounter for initial prescription of implantable subdermal contraceptive: Secondary | ICD-10-CM

## 2012-11-29 DIAGNOSIS — Z309 Encounter for contraceptive management, unspecified: Secondary | ICD-10-CM

## 2012-11-29 HISTORY — DX: Encounter for initial prescription of implantable subdermal contraceptive: Z30.017

## 2012-11-29 LAB — POCT URINE PREGNANCY: Preg Test, Ur: NEGATIVE

## 2012-11-29 NOTE — Progress Notes (Addendum)
Subjective:     Patient ID: [name], female   DOB: 09/17/1985, 27 y.o.   MRN: [id number]  HPI [name] is in for nexplanon insertion.  Review of Systems See HPI Reviewed past medical,surgical, social and family history. Reviewed medications and allergies.     Objective:   Physical Exam BP 120/80  Ht 5\' 3"  (1.6 m)  Wt 239 lb (108.41 kg)  BMI 42.35 kg/m2  LMP 11/26/2012  Breastfeeding? Nourine pregnancy test negative. Consent signed. Left arm cleansed with betadine, and injected with 1.5 cc 2% lidocaine and waited til numb. Nexplanon easily inserted and steri strips applied. Pressure dressing applied.easily palpated by pt and provider.    Assessment:    Nexplanon insertion exp 111/16, lot [id number] Contraceptive management    Plan:     Use condoms x 2 weeks, keep clean and dry x 24 hours, no heavy lifting, keep steri strips on x 72 hours, Keep pressure dressing on x 24 hours. Follow up prn problems.   Physical and pap in 1 year.

## 2012-11-29 NOTE — Patient Instructions (Addendum)
Use condoms x 2 weeks, keep clean and dry x 24 hours, no heavy lifting, keep steri strips on x 72 hours, Keep pressure dressing on x 24 hours. Follow up prn problems. Physical and pap in 1 year

## 2013-01-24 ENCOUNTER — Telehealth: Payer: Self-pay | Admitting: Obstetrics & Gynecology

## 2013-01-24 NOTE — Telephone Encounter (Signed)
Pt states has implanon since late July early August now have heavy vaginal bleeding x 1 week with cramping. Explained to pt could be her period, sporadic vaginal bleeding was a common side effect of implanon. Pt to call office back as needed.

## 2013-02-07 ENCOUNTER — Other Ambulatory Visit: Payer: Self-pay

## 2013-02-27 ENCOUNTER — Ambulatory Visit: Payer: BC Managed Care – PPO | Admitting: Family Medicine

## 2013-05-20 ENCOUNTER — Telehealth: Payer: Self-pay

## 2013-05-22 NOTE — Telephone Encounter (Signed)
Spoke with pt. Last pap was 03/30/12, normal result. Advised should not need another one until at least 03/2014. Pt voiced understanding. JSY

## 2013-06-11 ENCOUNTER — Ambulatory Visit: Payer: BC Managed Care – PPO | Admitting: Adult Health

## 2013-06-13 ENCOUNTER — Encounter: Payer: Self-pay | Admitting: Advanced Practice Midwife

## 2013-06-13 ENCOUNTER — Ambulatory Visit (INDEPENDENT_AMBULATORY_CARE_PROVIDER_SITE_OTHER): Payer: BC Managed Care – PPO | Admitting: Advanced Practice Midwife

## 2013-06-13 VITALS — BP 122/80 | Ht 63.0 in | Wt 246.5 lb

## 2013-06-13 DIAGNOSIS — N898 Other specified noninflammatory disorders of vagina: Secondary | ICD-10-CM

## 2013-06-13 DIAGNOSIS — N939 Abnormal uterine and vaginal bleeding, unspecified: Secondary | ICD-10-CM

## 2013-06-13 DIAGNOSIS — Z975 Presence of (intrauterine) contraceptive device: Principal | ICD-10-CM

## 2013-06-13 DIAGNOSIS — Z309 Encounter for contraceptive management, unspecified: Secondary | ICD-10-CM

## 2013-06-13 DIAGNOSIS — N921 Excessive and frequent menstruation with irregular cycle: Secondary | ICD-10-CM | POA: Insufficient documentation

## 2013-06-13 LAB — POCT HEMOGLOBIN: Hemoglobin: 13.9 g/dL (ref 12.2–16.2)

## 2013-06-13 MED ORDER — MEGESTROL ACETATE 40 MG PO TABS: ORAL_TABLET | ORAL | Status: DC

## 2013-06-13 NOTE — Progress Notes (Signed)
Nicole AsaJessica L Rhodes 28 y.o.  Filed Vitals:   06/13/13 1322  BP: 122/80   Past Medical History  Diagnosis Date  . Hypertension   . Migraines   . Preterm delivery   . Depression   . UTI (lower urinary tract infection)   . Obesity   . Seasonal rhinitis   . Fibrocystic breast changes   . Nexplanon insertion 11/29/2012    inserted nexplanon 11/29/12 in left arm, remove 11/30/15   Past Surgical History  Procedure Laterality Date  . Cholecystectomy    . Tonsillectomy     family history includes Diabetes in her paternal grandfather and paternal grandmother; Heart disease in her paternal grandfather; Hypertension in her father, maternal grandfather, maternal grandmother, mother, paternal grandfather, and paternal grandmother. Current outpatient prescriptions:etonogestrel (NEXPLANON) 68 MG IMPL implant, Inject 1 each into the skin once., Disp: , Rfl:   HPI: Nexplanon for about 6 months with some off and on bleeding.  Has been bleeding/spotting now for 3 weeks.  Annoyed  ASSESSMENT: DUB 2/2 Nexplanon  PLAN:  Megace 40mg :  2/day for 5 days, then 1 a day for 5 days.  May take continuously for a while if bleeding resumes

## 2013-06-14 ENCOUNTER — Ambulatory Visit: Payer: BC Managed Care – PPO | Admitting: Adult Health

## 2013-07-31 ENCOUNTER — Ambulatory Visit: Payer: BC Managed Care – PPO | Admitting: Advanced Practice Midwife

## 2013-08-01 ENCOUNTER — Ambulatory Visit: Payer: BC Managed Care – PPO | Admitting: Advanced Practice Midwife

## 2013-08-01 ENCOUNTER — Telehealth: Payer: Self-pay | Admitting: *Deleted

## 2013-08-01 NOTE — Telephone Encounter (Signed)
Pt states saw Cathie BeamsFran Cresenzo-Dishmon, CNM on 06/13/2013 continues to have the break through bleeding with nexplanon after taking the Megace daily. Pt was unable to keep her appt today. Please advise.

## 2013-08-07 ENCOUNTER — Ambulatory Visit: Payer: BC Managed Care – PPO | Admitting: Advanced Practice Midwife

## 2013-08-07 NOTE — Telephone Encounter (Signed)
Left message

## 2013-09-25 ENCOUNTER — Ambulatory Visit: Payer: BC Managed Care – PPO | Admitting: Advanced Practice Midwife

## 2013-10-01 ENCOUNTER — Ambulatory Visit: Payer: BC Managed Care – PPO | Admitting: Advanced Practice Midwife

## 2013-10-02 ENCOUNTER — Telehealth: Payer: Self-pay | Admitting: Obstetrics and Gynecology

## 2013-10-02 ENCOUNTER — Encounter: Payer: Self-pay | Admitting: Adult Health

## 2013-10-02 ENCOUNTER — Ambulatory Visit (INDEPENDENT_AMBULATORY_CARE_PROVIDER_SITE_OTHER): Payer: BC Managed Care – PPO | Admitting: Adult Health

## 2013-10-02 DIAGNOSIS — Z3200 Encounter for pregnancy test, result unknown: Secondary | ICD-10-CM

## 2013-10-02 DIAGNOSIS — Z3202 Encounter for pregnancy test, result negative: Secondary | ICD-10-CM

## 2013-10-02 LAB — POCT URINE PREGNANCY: PREG TEST UR: NEGATIVE

## 2013-10-02 NOTE — Progress Notes (Signed)
Pt here for pregnancy test. Negative result. Pt states she has had 2 faint positive results at home. Spoke with Victorino DikeJennifer, NP who advised to do a quant hcg. Pt voiced understanding. JSY

## 2013-10-02 NOTE — Telephone Encounter (Signed)
Spoke with pt. Pt states she has the Nexplanon and has had 2 faint positive preg tests. Advised would need an appt to come in for a pregnancy test. Call transferred to front desk for appt. JSY

## 2013-10-03 ENCOUNTER — Telehealth: Payer: Self-pay | Admitting: Obstetrics and Gynecology

## 2013-10-03 LAB — HCG, QUANTITATIVE, PREGNANCY: hCG, Beta Chain, Quant, S: <2 m[IU]/mL

## 2013-10-03 NOTE — Telephone Encounter (Signed)
Spoke with pt letting her know her quant hcg was negative. JSY

## 2013-12-26 ENCOUNTER — Other Ambulatory Visit: Payer: BC Managed Care – PPO | Admitting: Adult Health

## 2014-01-01 ENCOUNTER — Other Ambulatory Visit: Payer: BC Managed Care – PPO | Admitting: Adult Health

## 2014-02-03 ENCOUNTER — Encounter: Payer: Self-pay | Admitting: Adult Health

## 2014-04-24 ENCOUNTER — Other Ambulatory Visit: Payer: Self-pay | Admitting: Adult Health

## 2014-04-30 ENCOUNTER — Other Ambulatory Visit: Payer: Self-pay | Admitting: Adult Health

## 2014-05-08 ENCOUNTER — Encounter: Payer: Self-pay | Admitting: Adult Health

## 2014-05-08 ENCOUNTER — Other Ambulatory Visit: Payer: Self-pay | Admitting: Adult Health

## 2014-08-27 ENCOUNTER — Ambulatory Visit: Payer: Self-pay | Admitting: Advanced Practice Midwife

## 2014-08-27 ENCOUNTER — Encounter: Payer: Self-pay | Admitting: Advanced Practice Midwife

## 2015-01-01 ENCOUNTER — Telehealth: Payer: Self-pay | Admitting: *Deleted

## 2015-01-01 NOTE — Telephone Encounter (Signed)
Spoke with pt. Pt had period 2 weeks ago. She has been spotting and cramping since then. She has had the Nexplanon x 2 years. I advised she would need an appt to be seen. Pt voiced understanding. Call transferred to front desk for appt with female provider. JSY

## 2015-01-01 NOTE — Telephone Encounter (Signed)
Left message x 1. JSY 

## 2015-01-07 ENCOUNTER — Other Ambulatory Visit: Payer: Self-pay | Admitting: Adult Health

## 2015-01-19 ENCOUNTER — Encounter: Payer: Self-pay | Admitting: Adult Health

## 2015-01-19 ENCOUNTER — Ambulatory Visit (INDEPENDENT_AMBULATORY_CARE_PROVIDER_SITE_OTHER): Payer: BLUE CROSS/BLUE SHIELD | Admitting: Adult Health

## 2015-01-19 VITALS — BP 108/80 | HR 78 | Ht 63.0 in | Wt 252.5 lb

## 2015-01-19 DIAGNOSIS — Z3202 Encounter for pregnancy test, result negative: Secondary | ICD-10-CM

## 2015-01-19 DIAGNOSIS — N939 Abnormal uterine and vaginal bleeding, unspecified: Secondary | ICD-10-CM | POA: Insufficient documentation

## 2015-01-19 DIAGNOSIS — Z3046 Encounter for surveillance of implantable subdermal contraceptive: Secondary | ICD-10-CM | POA: Insufficient documentation

## 2015-01-19 DIAGNOSIS — Z975 Presence of (intrauterine) contraceptive device: Secondary | ICD-10-CM

## 2015-01-19 HISTORY — DX: Abnormal uterine and vaginal bleeding, unspecified: N93.9

## 2015-01-19 HISTORY — DX: Presence of (intrauterine) contraceptive device: Z97.5

## 2015-01-19 LAB — POCT URINE PREGNANCY: PREG TEST UR: NEGATIVE

## 2015-01-19 MED ORDER — MEGESTROL ACETATE 40 MG PO TABS: ORAL_TABLET | ORAL | Status: DC

## 2015-01-19 NOTE — Patient Instructions (Signed)
Take megace  Return in 1 week for US then in 2-3 day see me for pap and physical Dysfunctional Uterine Bleeding Dysfunctional uterine bleeding is abnormal bleeding from the uterus. Dysfunctional uterine bleeding includes:  A period that comes earlier or later than usual.  A period that is lighter, heavier, or has blood clots.  Bleeding between periods.  Skipping one or more periods.  Bleeding after sexual intercourse.  Bleeding after menopause. HOME CARE INSTRUCTIONS  Pay attention to any changes in your symptoms. Follow these instructions to help with your condition: Eating  Eat well-balanced meals. Include foods that are high in iron, such as liver, meat, shellfish, green leafy vegetables, and eggs.  If you become constipated:  Drink plenty of water.  Eat fruits and vegetables that are high in water and fiber, such as spinach, carrots, raspberries, apples, and mango. Medicines  Take over-the-counter and prescription medicines only as told by your health care provider.  Do not change medicines without talking with your health care provider.  Aspirin or medicines that contain aspirin may make the bleeding worse. Do not take those medicines:  During the week before your period.  During your period.  If you were prescribed iron pills, take them as told by your health care provider. Iron pills help to replace iron that your body loses because of this condition. Activity  If you need to change your sanitary pad or tampon more than one time every 2 hours:  Lie in bed with your feet raised (elevated).  Place a cold pack on your lower abdomen.  Rest as much as possible until the bleeding stops or slows down.  Do not try to lose weight until the bleeding has stopped and your blood iron level is back to normal. Other Instructions  For two months, write down:  When your period starts.  When your period ends.  When any abnormal bleeding occurs.  What problems you  notice.  Keep all follow up visits as told by your health care provider. This is important. SEEK MEDICAL CARE IF:  You get light-headed or weak.  You have nausea and vomiting.  You cannot eat or drink without vomiting.  You feel dizzy or have diarrhea while you are taking medicines.  You are taking birth control pills or hormones, and you want to change them or stop taking them. SEEK IMMEDIATE MEDICAL CARE IF:  You develop a fever or chills.  You need to change your sanitary pad or tampon more than one time per hour.  Your bleeding becomes heavier, or your flow contains clots more often.  You develop pain in your abdomen.  You lose consciousness.  You develop a rash.   This information is not intended to replace advice given to you by your health care provider. Make sure you discuss any questions you have with your health care provider.   Document Released: 03/18/2000 Document Revised: 12/10/2014 Document Reviewed: 06/16/2014 Elsevier Interactive Patient Education [employer]2016 Elsevier Inc.

## 2015-01-19 NOTE — Progress Notes (Signed)
Subjective:     Patient ID: Fabio AsaJessica L Fidel, female   DOB: Dec 30, 1985, 29 y.o.   MRN: [id number]  HPI [name] is a 29 year old white female in complaining of bleeding since 12/14/14 and some clots, has nexplanon.  Review of Systems Patient denies any headaches, hearing loss, fatigue, blurred vision, shortness of breath, chest pain, problems with bowel movements, urination, or intercourse. No joint pain or mood swings.See HPI for positives. Patient denies any headaches, hearing loss, fatigue, blurred vision, shortness of breath, chest pain, abdominal pain, problems with bowel movements, urination, or intercourse. No joint pain or mood swings.    Objective:   Physical Exam BP 108/80 mmHg  Pulse 78  Ht 5\' 3"  (1.6 m)  Wt 252 lb 8 oz (114.533 kg)  BMI 44.74 kg/m2  LMP 12/14/2014 UPT negative,Skin warm and dry.Pelvic: external genitalia is normal in appearance no lesions, vagina: + period like blood,urethra has no lesions or masses noted, cervix:smooth and bulbous, uterus: normal size, shape and contour, mildly tender, no masses felt, adnexa: no masses or tenderness noted. Bladder is non tender and no masses felt.  GC/CHL obtained.     Assessment:    AUB  nexplanon in place    Plan:    Rx megace 40 mg #45 3 x 5 days then 2 x 5 days then 1 daily with 1 refill GC/CHL sent Follow up in 1 week for gyn US and then see me 2-3 days later for pap and physical Review handout on AUB

## 2015-01-20 LAB — GC/CHLAMYDIA PROBE AMP
Chlamydia trachomatis, NAA: NEGATIVE
NEISSERIA GONORRHOEAE BY PCR: NEGATIVE

## 2015-01-27 ENCOUNTER — Ambulatory Visit (INDEPENDENT_AMBULATORY_CARE_PROVIDER_SITE_OTHER): Payer: BLUE CROSS/BLUE SHIELD

## 2015-01-27 DIAGNOSIS — N939 Abnormal uterine and vaginal bleeding, unspecified: Secondary | ICD-10-CM | POA: Diagnosis not present

## 2015-01-27 NOTE — Progress Notes (Signed)
PELVIC US TA/TV: normal anteverted uterus,normal ov's bilat (mobile),eec 2.736mm,no free fluid,bilat adnexal pain during ultrasound

## 2015-01-30 ENCOUNTER — Other Ambulatory Visit (HOSPITAL_COMMUNITY)
Admission: RE | Admit: 2015-01-30 | Discharge: 2015-01-30 | Disposition: A | Discharge status: Home / Self Care | Payer: BLUE CROSS/BLUE SHIELD | Source: Ambulatory Visit | Attending: Obstetrics and Gynecology | Admitting: Obstetrics and Gynecology

## 2015-01-30 ENCOUNTER — Ambulatory Visit (INDEPENDENT_AMBULATORY_CARE_PROVIDER_SITE_OTHER): Payer: BLUE CROSS/BLUE SHIELD | Admitting: Adult Health

## 2015-01-30 ENCOUNTER — Encounter: Payer: Self-pay | Admitting: Adult Health

## 2015-01-30 VITALS — BP 112/70 | HR 82 | Ht 63.5 in | Wt 253.0 lb

## 2015-01-30 DIAGNOSIS — Z01419 Encounter for gynecological examination (general) (routine) without abnormal findings: Secondary | ICD-10-CM | POA: Insufficient documentation

## 2015-01-30 DIAGNOSIS — Z975 Presence of (intrauterine) contraceptive device: Secondary | ICD-10-CM

## 2015-01-30 NOTE — Patient Instructions (Signed)
Physical in 1 year Continue megace as needed

## 2015-01-30 NOTE — Progress Notes (Signed)
Patient ID: [name], female   DOB: [birth year], 29 y.o.   MRN: [id number] History of Present Illness: [name] is a 29 year old white female in for a well woman gyn exam and pap.She had pelvic US earlier this week for bleeding with nexplanon and is was normal and bleeding has stopped since taking megace,GC/CHL was negative.   Current Medications, Allergies, Past Medical History, Past Surgical History, Family History and Social History were reviewed in [facility] electronic medical record.     Review of Systems: Patient denies any headaches, hearing loss, fatigue, blurred vision, shortness of breath, chest pain, abdominal pain, problems with bowel movements, urination, or intercourse. No joint pain or mood swings.    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   The vagina is normal in appearance. Urethra has no lesions or masses. The cervix is bulbous,pap performed.  Uterus is felt to be normal size, shape, and contour.  No adnexal masses or tenderness noted.Bladder is non tender, no masses felt. Extremities/musculoskeletal:  No swelling or varicosities noted, no clubbing or cyanosis Psych:  No mood changes, alert and cooperative,seems happy   Impression: Well woman gyn exam with pap  Nexplanon in place   Plan: Physical in 1 year Continue megace as needed

## 2015-02-04 LAB — CYTOLOGY - PAP

## 2015-06-01 ENCOUNTER — Telehealth: Payer: Self-pay | Admitting: Obstetrics & Gynecology

## 2015-06-01 NOTE — Telephone Encounter (Signed)
Pt c/o breast pain and discharge from her breast, shoot pain in left breast extends to nipple. Pt offered an appt for today with Victorino Dike, pt states unable to come due to work schedule needs an appt 1 week from now. Pt given an appt for June 08, 2015 with Cyril Mourning, NP.

## 2015-06-01 NOTE — Telephone Encounter (Signed)
Pt has is ovarian pain, please contact pt

## 2015-06-08 ENCOUNTER — Ambulatory Visit: Payer: Self-pay | Admitting: Adult Health

## 2015-06-27 ENCOUNTER — Emergency Department (HOSPITAL_COMMUNITY)
Admission: EM | Admit: 2015-06-27 | Discharge: 2015-06-28 | Disposition: A | Discharge status: Home / Self Care | Payer: BLUE CROSS/BLUE SHIELD | Attending: Emergency Medicine | Admitting: Emergency Medicine

## 2015-06-27 ENCOUNTER — Encounter (HOSPITAL_COMMUNITY): Payer: Self-pay

## 2015-06-27 DIAGNOSIS — Y939 Activity, unspecified: Secondary | ICD-10-CM | POA: Insufficient documentation

## 2015-06-27 DIAGNOSIS — S82142A Displaced bicondylar fracture of left tibia, initial encounter for closed fracture: Secondary | ICD-10-CM | POA: Diagnosis not present

## 2015-06-27 DIAGNOSIS — Y999 Unspecified external cause status: Secondary | ICD-10-CM | POA: Insufficient documentation

## 2015-06-27 DIAGNOSIS — Y929 Unspecified place or not applicable: Secondary | ICD-10-CM | POA: Insufficient documentation

## 2015-06-27 DIAGNOSIS — I10 Essential (primary) hypertension: Secondary | ICD-10-CM | POA: Diagnosis not present

## 2015-06-27 DIAGNOSIS — S53402A Unspecified sprain of left elbow, initial encounter: Secondary | ICD-10-CM | POA: Insufficient documentation

## 2015-06-27 DIAGNOSIS — E669 Obesity, unspecified: Secondary | ICD-10-CM | POA: Diagnosis not present

## 2015-06-27 DIAGNOSIS — F329 Major depressive disorder, single episode, unspecified: Secondary | ICD-10-CM | POA: Diagnosis not present

## 2015-06-27 DIAGNOSIS — S8992XA Unspecified injury of left lower leg, initial encounter: Secondary | ICD-10-CM | POA: Diagnosis present

## 2015-06-27 NOTE — ED Notes (Signed)
She fell off of a trailer on to the ground and landed on her left knee.  My left elbow hurts also. Unable to put pressure on it.

## 2015-06-28 ENCOUNTER — Emergency Department (HOSPITAL_COMMUNITY): Payer: BLUE CROSS/BLUE SHIELD

## 2015-06-28 LAB — POC URINE PREG, ED: Preg Test, Ur: NEGATIVE

## 2015-06-28 MED ORDER — IBUPROFEN 600 MG PO TABS
600.0000 mg | ORAL_TABLET | Freq: Four times a day (QID) | ORAL | Status: DC | PRN
Start: 2015-06-28 — End: 2015-07-03

## 2015-06-28 MED ORDER — OXYCODONE-ACETAMINOPHEN 5-325 MG PO TABS: 1.0000 | ORAL_TABLET | ORAL | Status: DC | PRN

## 2015-06-28 MED ORDER — HYDROCODONE-ACETAMINOPHEN 5-325 MG PO TABS
1.0000 | ORAL_TABLET | Freq: Once | ORAL | Status: AC
  Administered 2015-06-28: 1 via ORAL
  Filled 2015-06-28: qty 1

## 2015-06-28 MED ORDER — IBUPROFEN 800 MG PO TABS
800.0000 mg | ORAL_TABLET | Freq: Once | ORAL | Status: AC
  Administered 2015-06-28: 800 mg via ORAL
  Filled 2015-06-28: qty 1

## 2015-06-28 NOTE — Discharge Instructions (Signed)
Tibial Fracture, Adult °A tibial fracture is a break in your tibia bone. The tibia is the large shin bone in your lower leg. The bone will be held in place with a cast or splint until it is healed. °HOME CARE °· If you have a cast: °¨ Do not scratch under the cast. °¨ Check the skin around the cast every day. You may put lotion on any red or sore areas. °¨ Keep your cast dry and clean. °· If you have a splint: °¨ Wear the splint as told by your doctor. °¨ Loosen the elastic around the splint if your toes get numb, tingle, or turn cold or blue. °· Do not put pressure on the cast or splint until it is hard. °· Do not put the cast or splint in water. Cover it with a plastic bag when bathing. °· Use crutches as told by your doctor. °· Take medicines only as told by your doctor. °· Keep all follow-up visits as told by your doctor. This is important. °GET HELP IF: °· Your pain gets worse or is not controlled with medicine. °· You have increased puffiness (swelling) or redness in your foot. °· You start to lose feeling in your foot or toes. °GET HELP RIGHT AWAY IF: °· Your foot or toes get cold or turn blue. °· You have bad pain in your leg, especially if it gets worse when you move your toes. °MAKE SURE YOU: °· Understand these instructions. °· Will watch your condition. °· Will get help right away if you are not doing well or get worse. °  °This information is not intended to replace advice given to you by your health care provider. Make sure you discuss any questions you have with your health care provider. °  °Document Released: 04/23/2010 Document Revised: 08/05/2014 Document Reviewed: 05/15/2013 °Elsevier Interactive Patient Education ©2016 Elsevier Inc. ° °

## 2015-06-28 NOTE — ED Provider Notes (Signed)
CSN: [id number]     Arrival date & time 06/27/15  2340 History   First MD Initiated Contact with Patient 06/28/15 0000     Chief Complaint  Patient presents with  . Fall     (Consider location/radiation/quality/duration/timing/severity/associated sxs/prior Treatment) HPI   [name] is a 30 y.o. female who presents to the Emergency Department complaining of sudden onset of left knee and left elbow pain that began after a fall from a trailer that occurred around 6 pm.  She states that she stepped off a trailer, approximately two feet and fell, landing on her left side.  She reports sudden pain to her knee and unable to bear weight the knee since the fall.  She also reports having pain to the posterior left elbow.  Pain of the elbow is worse with movement and improves at rest.  She applied ice briefly with some improvement.  She denies numbness or weakness, head injury, LOC, neck or back pain.     Past Medical History  Diagnosis Date  . Hypertension   . Migraines   . Preterm delivery   . Depression   . UTI (lower urinary tract infection)   . Obesity   . Seasonal rhinitis   . Fibrocystic breast changes   . Nexplanon insertion 11/29/2012    inserted nexplanon 11/29/12 in left arm, remove 11/30/15  . Abnormal uterine bleeding (AUB) 01/19/2015  . Nexplanon in place 01/19/2015   Past Surgical History  Procedure Laterality Date  . Cholecystectomy    . Tonsillectomy     Family History  Problem Relation Age of Onset  . Hypertension Mother   . Hypertension Father   . Hypertension Maternal Grandmother   . Hypertension Maternal Grandfather   . Diabetes Paternal Grandmother   . Hypertension Paternal Grandmother   . Heart disease Paternal Grandfather   . Diabetes Paternal Grandfather   . Hypertension Paternal Grandfather    Social History  Substance Use Topics  . Smoking status: Never Smoker   . Smokeless tobacco: Never Used  . Alcohol Use: No   OB History    Gravida Para  Term Preterm AB TAB SAB Ectopic Multiple Living   3 2  2 1  1   2      Review of Systems  Constitutional: Negative for fever and chills.  Musculoskeletal: Positive for arthralgias (left knee and elbow pain). Negative for back pain, joint swelling and neck pain.  Skin: Negative for color change and wound.  Neurological: Negative for dizziness, weakness and numbness.  All other systems reviewed and are negative.     Allergies  Codeine and Dilaudid  Home Medications   Prior to Admission medications   Medication Sig Start Date End Date Taking? Authorizing Provider  etonogestrel (NEXPLANON) 68 MG IMPL implant Inject 1 each into the skin once.    Historical Provider, MD  megestrol (MEGACE) 40 MG tablet Take 3 for 5 days then 2 x 5 days then 1 daily 01/19/15   Adline PotterJennifer A Griffin, NP   BP 123/58 mmHg  Pulse 113  Temp(Src) 97.6 F (36.4 C) (Oral)  Resp 18  Ht 5\' 3"  (1.6 m)  Wt 104.327 kg  BMI 40.75 kg/m2  SpO2 100%  LMP 06/03/2015 Physical Exam  Constitutional: She is oriented to person, place, and time. She appears well-developed and well-nourished. No distress.  HENT:  Head: Normocephalic and atraumatic.  Neck: Normal range of motion.  Cardiovascular: Normal rate, regular rhythm and intact distal pulses.   Pulmonary/Chest:  Effort normal and breath sounds normal.  Musculoskeletal: She exhibits tenderness.  Diffuse ttp of the anterior left knee.  Moderate edema. Pt unable to flex the knee due to level of pain. Tenderness of the lateral left elbow without edema.  No erythema, effusion, or step-off deformity.  DP pulse brisk, distal sensation intact. Compartments soft  Neurological: She is alert and oriented to person, place, and time. She exhibits normal muscle tone. Coordination normal.  Skin: Skin is warm and dry. No erythema.  Nursing note and vitals reviewed.   ED Course  Procedures (including critical care time) Labs Review Labs Reviewed - No data to display  Imaging  Review Dg Elbow Complete Left  06/28/2015  CLINICAL DATA:  30 year old female with fall and left elbow pain EXAM: LEFT ELBOW - COMPLETE 3+ VIEW COMPARISON:  None. FINDINGS: There is no evidence of fracture, dislocation, or joint effusion. There is no evidence of arthropathy or other focal bone abnormality. Soft tissues are unremarkable. IMPRESSION: Negative. Electronically Signed   By: Elgie Collard M.D.   On: 06/28/2015 00:45   Dg Knee Complete 4 Views Left  06/28/2015  CLINICAL DATA:  Left knee pain after fall. EXAM: LEFT KNEE - COMPLETE 4+ VIEW COMPARISON:  None. FINDINGS: Mildly depressed fracture of the left lateral tibial plateau with fracture lines extending to the tibial spines. Fracture lines also appear to extend to the tibial fibular joint. Medial compartment appears intact. Moderate size left knee effusion. IMPRESSION: Depressed left tibial plateau fracture.  Left knee effusion. Electronically Signed   By: Burman Nieves M.D.   On: 06/28/2015 00:49   I have personally reviewed and evaluated these images and lab results as part of my medical decision-making.   EKG Interpretation None      MDM   Final diagnoses:  Tibial plateau fracture, left, closed, initial encounter  Sprain of elbow, left, initial encounter   Pt is well appearing.  Vitals stable.  Remains NV intact.    0100 consulted Dr. [name], recommends CT of knee, knee immobilizer, crutches, no wt bearing.  He will see pt in office on Monday for f/u.  Remains NV intact. Pain improved after ibuprofen and vicodin  0140  Pt signed out to Dr. Elesa Massed at end of shift.  She agrees to review CT results and arrange dispo  Pauline Aus, PA-C 06/28/15 0154  Layla Maw Ward, DO 06/28/15 0454

## 2015-06-29 ENCOUNTER — Encounter: Payer: Self-pay | Admitting: Orthopedic Surgery

## 2015-06-29 ENCOUNTER — Other Ambulatory Visit: Payer: Self-pay

## 2015-06-29 ENCOUNTER — Encounter (HOSPITAL_COMMUNITY): Payer: Self-pay

## 2015-06-29 ENCOUNTER — Encounter (HOSPITAL_COMMUNITY)
Admission: RE | Admit: 2015-06-29 | Discharge: 2015-06-29 | Disposition: A | Discharge status: Home / Self Care | Payer: BLUE CROSS/BLUE SHIELD | Source: Ambulatory Visit | Attending: Orthopedic Surgery | Admitting: Orthopedic Surgery

## 2015-06-29 ENCOUNTER — Ambulatory Visit (INDEPENDENT_AMBULATORY_CARE_PROVIDER_SITE_OTHER): Payer: BLUE CROSS/BLUE SHIELD | Admitting: Orthopedic Surgery

## 2015-06-29 ENCOUNTER — Telehealth: Payer: Self-pay | Admitting: Orthopedic Surgery

## 2015-06-29 VITALS — BP 126/91 | Ht 63.0 in | Wt 230.0 lb

## 2015-06-29 DIAGNOSIS — S82142A Displaced bicondylar fracture of left tibia, initial encounter for closed fracture: Secondary | ICD-10-CM | POA: Diagnosis not present

## 2015-06-29 HISTORY — DX: Other complications of anesthesia, initial encounter: T88.59XA

## 2015-06-29 HISTORY — DX: Adverse effect of unspecified anesthetic, initial encounter: T41.45XA

## 2015-06-29 HISTORY — DX: Family history of other specified conditions: Z84.89

## 2015-06-29 LAB — BASIC METABOLIC PANEL
ANION GAP: 9 (ref 5–15)
BUN: 15 mg/dL (ref 6–20)
CALCIUM: 9.1 mg/dL (ref 8.9–10.3)
CHLORIDE: 109 mmol/L (ref 101–111)
CO2: 21 mmol/L — ABNORMAL LOW (ref 22–32)
CREATININE: 0.68 mg/dL (ref 0.44–1.00)
GFR calc non Af Amer: >60 mL/min (ref >60)
Glucose, Bld: 94 mg/dL (ref 65–99)
Potassium: 4.1 mmol/L (ref 3.5–5.1)
SODIUM: 139 mmol/L (ref 135–145)

## 2015-06-29 LAB — CBC
HEMATOCRIT: 39.2 % (ref 36.0–46.0)
HEMOGLOBIN: 13.1 g/dL (ref 12.0–15.0)
MCH: 30.2 pg (ref 26.0–34.0)
MCHC: 33.4 g/dL (ref 30.0–36.0)
MCV: 90.3 fL (ref 78.0–100.0)
Platelets: 286 10*3/uL (ref 150–400)
RBC: 4.34 MIL/uL (ref 3.87–5.11)
RDW: 12.1 % (ref 11.5–15.5)
WBC: 10 10*3/uL (ref 4.0–10.5)

## 2015-06-29 NOTE — Patient Instructions (Signed)
Fabio AsaJessica L Niday  06/29/2015     @PREFPERIOPPHARMACY @   Your procedure is scheduled on 07/01/2015.  [facility] at [facility]:30 [facility].  Call this number if you have problems the morning of surgery:  [phone]   Remember:  Do not eat food or drink liquids after midnight.  Take these medicines the morning of surgery with A SIP OF WATER none   Do not wear jewelry, make-up or nail polish.  Do not wear lotions, powders, or perfumes.  You may wear deodorant.  Do not shave 48 hours prior to surgery.  Men may shave face and neck.  Do not bring valuables to the hospital.  [facility] is not responsible for any belongings or valuables.  Contacts, dentures or bridgework may not be worn into surgery.  Leave your suitcase in the car.  After surgery it may be brought to your room.  For patients admitted to the hospital, discharge time will be determined by your treatment team.  Patients discharged the day of surgery will not be allowed to drive home.   Name and phone number of your driver:   family Special instructions:  none  Please read over the following fact sheets that you were given. Care and Recovery After Surgery    Tibial Plateau Fracture Treated With Open Reduction A tibial plateau fracture is a break in the bone that forms the bottom of your knee joint (tibia or shin bone). The lower end of your thigh bone (femur) forms the upper surface of your knee joint. The top of the tibia has a flat, smooth surface (tibial plateau). This part of your shin bone is made up of softer bone than the shaft of your shin bone. If a strong force shoves your femur down into your tibial plateau, the tibial plateau can collapse or break away at the edges. A displaced tibial plateau fracture means that one or more pieces of your tibial plateau have been moved out of normal position. This type of fracture is treated with open reduction. Open reduction is a type of surgery to repair broken bones that have  been moved out of place (displaced fracture). The bone pieces are held in place with screws or other types of surgical hardware. This procedure helps bones to heal properly. It also helps to prevent severe arthritis from developing in the injured leg. LET [facility] PROVID[facility] KNOW ABOUT:  Any allergies you have.  All medicines you are taking, including vitamins, herbs, eye drops, creams, and over-the-counter medicines.  Previous problems you or members of your family have had with the use of anesthetics.  Any blood disorders you have.  Previous surgeries you have had.  Medical conditions you have. RISKS AND COMPLICATIONS Generally, this is a safe procedure. However, problems can occur and include:  Excessive bleeding.  Damage to blood vessels that supply the knee.  Infection. This can cause the screws or surgical hardware to loosen and be removed.  Improper healing. This can result in an unstable knee.  Knee stiffness.  Blood clot. This can form in the leg and travel to the lungs.  Knee pain.  Nerve damage. BEFORE THE PROCEDURE  Ask your health care provider about:  Changing or stopping your regular medicines. This is especially important if you are taking diabetes medicines or blood thinners.  Taking medicines such as aspirin and ibuprofen. These medicines can thin your blood. Do not take these medicines before your procedure if your health care provider instructs  you not to.  Follow your health care provider's restrictions on eating and drinking if you will be getting medicine that makes you go to sleep during your procedure (general anesthetic). PROCEDURE  An IV tube may be inserted into a vein.  You will be given one of the following:  A medicine that numbs the region of your body where the surgery will take place (regional anesthetic).  A medicine that makes you go to sleep (general anesthetic).  The skin over your kneewill be cleaned with a germ-killing  (antiseptic) solution. Your hip area will also be cleaned if your fracture requires a bone graft that is taken from your hipbone.  The surgeon will make a cut (incision) through your skin to expose the areas of the fracture.  The broken bones will be returned to their normal positions. The surgeon will use screws and a metal plate or different types of wiring to hold the bones in place.  If a bone graft is used, a small incision might be made over your hip to remove a piece of bone and place it into your knee for support.  The surgeon will close all incisions with stitches (sutures) or staples.  A bandage (dressing) will be placed over your incisions. AFTER THE PROCEDURE  You will stay in a recovery room. Your blood pressure, heart rate, breathing rate, and blood oxygen level will be monitored often until the medicines you were given have worn off.  It is normal to have some pain. You will be given medicine for pain relief.  You may have physical therapy while you are in the hospital.  You may need to wear a hinged knee brace. This lets your health care provider gently move your knee to prevent stiffness.   This information is not intended to replace advice given to you by your health care provider. Make sure you discuss any questions you have with your health care provider.   Document Released: 01/03/2014 Document Reviewed: 01/03/2014 Elsevier Interactive Patient Education [employer]

## 2015-06-29 NOTE — Patient Instructions (Addendum)
Surgery scheduled for Wednesday    Tibial Plateau Fracture Treated With Open Reduction A tibial plateau fracture is a break in the bone that forms the bottom of your knee joint (tibia or shin bone). The lower end of your thigh bone (femur) forms the upper surface of your knee joint. The top of the tibia has a flat, smooth surface (tibial plateau). This part of your shin bone is made up of softer bone than the shaft of your shin bone. If a strong force shoves your femur down into your tibial plateau, the tibial plateau can collapse or break away at the edges. A displaced tibial plateau fracture means that one or more pieces of your tibial plateau have been moved out of normal position. This type of fracture is treated with open reduction. Open reduction is a type of surgery to repair broken bones that have been moved out of place (displaced fracture). The bone pieces are held in place with screws or other types of surgical hardware. This procedure helps bones to heal properly. It also helps to prevent severe arthritis from developing in the injured leg. LET [facility] CARE PROVIDER KNOW ABOUT:  Any allergies you have.  All medicines you are taking, including vitamins, herbs, eye drops, creams, and over-the-counter medicines.  Previous problems you or members of your family have had with the use of anesthetics.  Any blood disorders you have.  Previous surgeries you have had.  Medical conditions you have. RISKS AND COMPLICATIONS Generally, this is a safe procedure. However, problems can occur and include:  Excessive bleeding.  Damage to blood vessels that supply the knee.  Infection. This can cause the screws or surgical hardware to loosen and be removed.  Improper healing. This can result in an unstable knee.  Knee stiffness.  Blood clot. This can form in the leg and travel to the lungs.  Knee pain.  Nerve damage. BEFORE THE PROCEDURE  Ask your health care provider  about:  Changing or stopping your regular medicines. This is especially important if you are taking diabetes medicines or blood thinners.  Taking medicines such as aspirin and ibuprofen. These medicines can thin your blood. Do not take these medicines before your procedure if your health care provider instructs you not to.  Follow your health care provider's restrictions on eating and drinking if you will be getting medicine that makes you go to sleep during your procedure (general anesthetic). PROCEDURE  An IV tube may be inserted into a vein.  You will be given one of the following:  A medicine that numbs the region of your body where the surgery will take place (regional anesthetic).  A medicine that makes you go to sleep (general anesthetic).  The skin over your kneewill be cleaned with a germ-killing (antiseptic) solution. Your hip area will also be cleaned if your fracture requires a bone graft that is taken from your hipbone.  The surgeon will make a cut (incision) through your skin to expose the areas of the fracture.  The broken bones will be returned to their normal positions. The surgeon will use screws and a metal plate or different types of wiring to hold the bones in place.  If a bone graft is used, a small incision might be made over your hip to remove a piece of bone and place it into your knee for support.  The surgeon will close all incisions with stitches (sutures) or staples.  A bandage (dressing) will be placed over your incisions.  AFTER THE PROCEDURE  You will stay in a recovery room. Your blood pressure, heart rate, breathing rate, and blood oxygen level will be monitored often until the medicines you were given have worn off.  It is normal to have some pain. You will be given medicine for pain relief.  You may have physical therapy while you are in the hospital.  You may need to wear a hinged knee brace. This lets your health care provider gently move your  knee to prevent stiffness.   This information is not intended to replace advice given to you by your health care provider. Make sure you discuss any questions you have with your health care provider.   Document Released: 01/03/2014 Document Reviewed: 01/03/2014 Elsevier Interactive Patient Education 2016 Elsevier Inc. Tibial Plateau Fracture Treated With Open Reduction, Care After Refer to this sheet in the next few weeks. These instructions provide you with information about caring for yourself after your procedure. Your health care provider may also give you more specific instructions. Your treatment has been planned according to current medical practices, but problems sometimes occur. Call your health care provider if you have any problems or questions after your procedure. WHAT TO EXPECT AFTER THE PROCEDURE After your procedure, it is typical to have the following:  Pain.  Swelling.  Stiffness.  Tingling or numbness. HOME CARE INSTRUCTIONS If You Have a Brace:  Wear it as directed by your health care provider.  Tell your health care provider if it becomes loose or feels uncomfortable. Bathing  Keep your bandage (dressing) dry until your health care provider says that it can be removed. Take sponge baths only. Ask the surgeon when you can start showering and bathing. Managing Pain, Stiffness, and Swelling  If directed, apply ice to the injured area.  Put ice in a plastic bag.  Place a towel between your skin and the bag.  Leave the ice on for 20 minutes, 2-3 times per day.  Raise the injured area above the level of your heart while you are sitting or lying down. Driving  Do not drive or operate heavy machinery while taking pain medicine.  Do not drive while wearing a brace on a leg that you use for driving. Activity  Return to your normal activities as directed by your health care provider. Ask your health care provider what activities are safe for you.  Perform  range-of-motion exercises only as directed by your health care provider. Safety  Do not use the injured limb to support your body weight until your health care provider says that you can. Use crutches or a walker as directed by your health care provider. General Instructions  Do not use any tobacco products, including cigarettes, chewing tobacco, or electronic cigarettes. Tobacco can delay bone healing. If you need help quitting, ask your health care provider.  There are many different ways to close and cover an incision, including stitches (sutures), skin glue, and adhesive strips. Follow your health care provider's instructions about:  Incision care.  Bandage (dressing) changes and removal.  Incision closure removal.  Check your incision area every day for signs of infection. Watch for redness, swelling, or discharge.  Take medicines only as directed by your health care provider.  Keep all follow-up visits as directed by your health care provider. This is important. SEEK MEDICAL CARE IF:  You have a fever.  Your pain medicine is not helping.  You have pain, warmth, and tenderness in your lower leg (calf).  You have  bleeding that comes through your dressing. SEEK IMMEDIATE MEDICAL CARE IF:  You have drainage, redness, swelling, or pain at your incision.  You notice a bad smell coming from the incision area or the dressing.  The edges of your incision come apart after the stitches or staples have been removed.  You have trouble breathing.  You have chest pain.   This information is not intended to replace advice given to you by your health care provider. Make sure you discuss any questions you have with your health care provider.   Document Released: 10/08/2004 Document Revised: 04/11/2014 Document Reviewed: 11/06/2013 Elsevier Interactive Patient Education [employer]2016 Elsevier Inc.

## 2015-06-29 NOTE — Telephone Encounter (Signed)
Regarding out-patient surgery scheduled at [facility] 07/01/15, CPT to include [id number], contacted insurer [employer] [id number]#[phone]; reached automated voice mail; left detailed message as to whether pre-authorization is required.

## 2015-06-29 NOTE — Progress Notes (Signed)
Chief Complaint   Patient presents with   .  Leg Injury       er follow up left tibal plateau fracture, DOI 06/28/15    HPI 29 years old history of asthma works at the animal [employer] fell on the 25th 2017 getting off a trailer landed on her left side injured her left knee and goal spur Lavina drove back to [place] for evaluation in the ER. She complains of constant throbbing 9 out of 10 pain and swelling left knee. She was placed in a brace a CAT scan and x-ray showed a depressed tibial plateau fracture on the lateral tibia. She is allergic to codeine with itching Dilaudid makes her feel funny. She denies numbness or tingling in the left leg  No other medical problems  Prior surgery includes gallbladder removal.  Review of Systems  Skin: Positive for rash.  Endo/Heme/Allergies:        Shellfish allergy      Past Medical History   Diagnosis  Date   .  Hypertension     .  Migraines     .  Preterm delivery     .  Depression     .  UTI (lower urinary tract infection)     .  Obesity     .  Seasonal rhinitis     .  Fibrocystic breast changes     .  Nexplanon insertion  11/29/2012       inserted nexplanon 11/29/12 in left arm, remove 11/30/15   .  Abnormal uterine bleeding (AUB)  01/19/2015   .  Nexplanon in place  01/19/2015       Past Surgical History   Procedure  Laterality  Date   .  Cholecystectomy       .  Tonsillectomy        Family History   Problem  Relation  Age of Onset   .  Hypertension  Mother     .  Hypertension  Father     .  Hypertension  Maternal Grandmother     .  Hypertension  Maternal Grandfather     .  Diabetes  Paternal Grandmother     .  Hypertension  Paternal Grandmother     .  Heart disease  Paternal Grandfather     .  Diabetes  Paternal Grandfather     .  Hypertension  Paternal Grandfather      Social History   Substance Use Topics   .  Smoking status:  Never Smoker    .  Smokeless tobacco:  Never Used   .  Alcohol Use:  No      Current outpatient prescriptions:   .  etonogestrel (NEXPLANON) 68 MG IMPL implant, Inject 1 each into the skin once., Disp: , Rfl:   .  ibuprofen (ADVIL,MOTRIN) 600 MG tablet, Take 1 tablet (600 mg total) by mouth every 6 (six) hours as needed. Take with food, Disp: 30 tablet, Rfl: 0 .  megestrol (MEGACE) 40 MG tablet, Take 3 for 5 days then 2 x 5 days then 1 daily, Disp: 45 tablet, Rfl: 1 .  oxyCODONE-acetaminophen (PERCOCET/ROXICET) 5-325 MG tablet, Take 1 tablet by mouth every 4 (four) hours as needed., Disp: 20 tablet, Rfl: 0  BP 126/91 mmHg  Ht 5' 3" (1.6 m)  Wt 230 lb (104.327 kg)  BMI 40.75 kg/m2  LMP 06/03/2015  Physical Exam  Constitutional: She is oriented to person, place, and time. She appears well-developed   and well-nourished. No distress.  HENT:   Head: Normocephalic and atraumatic.  Eyes: Conjunctivae are normal.  Cardiovascular: Normal rate and intact distal pulses.   Pulmonary/Chest: Effort normal.  Abdominal: Bowel sounds are normal. She exhibits no distension.  Musculoskeletal:  The patient's upper extremities are normal-no clubbing cyanosis or edema. She does have tenderness in the peri-articular region of the left elbow without crepitance passive range of motion painful but normal left elbow stable strength normal muscle tone normal skin normal pulse and perfusion normal.  Right shoulder elbow wrist and hand full range of motion normal stability strength skin and no tenderness or swelling neurovascular exam intact  Right knee exam full range of motion no tenderness or swelling all ligaments were stable strength and muscle tone normal skin intact pulses normal and sensation is normal as well.  Neurological: She is alert and oriented to person, place, and time. She has normal reflexes. She exhibits normal muscle tone. Coordination normal.  Skin: Skin is warm and dry. No rash noted. She is not diaphoretic. No erythema. No pallor.  Psychiatric: She has a normal  mood and affect. Her behavior is normal. Judgment and thought content normal.     Ortho Exam  Left knee lateral joint line tenderness minimal effusion compartments are soft knee range of motion not tested because of pain of felt stable to varus valgus and anterior posterior stability testing muscle tone normal skin intact no ecchymosis pulse and perfusion normal no peripheral edema   ASSESSMENT: My personal interpretation of the images:   X-ray showed a plus lateral tibial plateau fracture CAT scan confirms with about 8 mm of depression  Based on the patient's age and her weight and the fracture I recommend she have this elevated and fixed to preserve joint function try to prevent arthritis but without any guarantee.  I did give her the option of nonoperative treatment but with a high risk of degenerative arthritis    PLAN She decided to proceed with open treatment internal fixation and bone grafting of the left lateral plateau  This procedure has been fully reviewed with the patient and written informed consent has been obtained.  

## 2015-06-30 NOTE — Telephone Encounter (Signed)
Per [employer] 06/30/15, no pre-authorization required, and states must meet medical criteria and guidelines; per representative Kandace ParkinsKenyana W. Her name and today's date 06/30/15, 9:10am, for reference

## 2015-06-30 NOTE — H&P (Signed)
Chief Complaint   Patient presents with   .  Leg Injury       er follow up left tibal plateau fracture, DOI 06/28/15    HPI 30 years old history of asthma works at the [employer] fell on the 25th 2017 getting off a trailer landed on her left side injured her left knee and goal spur [place] drove back to [place] for evaluation in the ER. She complains of constant throbbing 9 out of 10 pain and swelling left knee. She was placed in a brace a CAT scan and x-ray showed a depressed tibial plateau fracture on the lateral tibia. She is allergic to codeine with itching Dilaudid makes her feel funny. She denies numbness or tingling in the left leg  No other medical problems  Prior surgery includes gallbladder removal.  Review of Systems  Skin: Positive for rash.  Endo/Heme/Allergies:        Shellfish allergy      Past Medical History   Diagnosis  Date   .  Hypertension     .  Migraines     .  Preterm delivery     .  Depression     .  UTI (lower urinary tract infection)     .  Obesity     .  Seasonal rhinitis     .  Fibrocystic breast changes     .  Nexplanon insertion  11/29/2012       inserted nexplanon 11/29/12 in left arm, remove 11/30/15   .  Abnormal uterine bleeding (AUB)  01/19/2015   .  Nexplanon in place  01/19/2015       Past Surgical History   Procedure  Laterality  Date   .  Cholecystectomy       .  Tonsillectomy        Family History   Problem  Relation  Age of Onset   .  Hypertension  Mother     .  Hypertension  Father     .  Hypertension  Maternal Grandmother     .  Hypertension  Maternal Grandfather     .  Diabetes  Paternal Grandmother     .  Hypertension  Paternal Grandmother     .  Heart disease  Paternal Grandfather     .  Diabetes  Paternal Grandfather     .  Hypertension  Paternal Grandfather      Social History   Substance Use Topics   .  Smoking status:  Never Smoker    .  Smokeless tobacco:  Never Used   .  Alcohol Use:  No      Current outpatient prescriptions:   .  etonogestrel (NEXPLANON) 68 MG IMPL implant, Inject 1 each into the skin once., Disp: , Rfl:   .  ibuprofen (ADVIL,MOTRIN) 600 MG tablet, Take 1 tablet (600 mg total) by mouth every 6 (six) hours as needed. Take with food, Disp: 30 tablet, Rfl: 0 .  megestrol (MEGACE) 40 MG tablet, Take 3 for 5 days then 2 x 5 days then 1 daily, Disp: 45 tablet, Rfl: 1 .  oxyCODONE-acetaminophen (PERCOCET/ROXICET) 5-325 MG tablet, Take 1 tablet by mouth every 4 (four) hours as needed., Disp: 20 tablet, Rfl: 0  BP 126/91 mmHg  Ht  (1.6 m)  Wt 230 lb (104.327 kg)  BMI 40.75 kg/m2  LMP 06/03/2015  Physical Exam  Constitutional: She is oriented to person, place, and time. She appears well-developed  and well-nourished. No distress.  HENT:   Head: Normocephalic and atraumatic.  Eyes: Conjunctivae are normal.  Cardiovascular: Normal rate and intact distal pulses.   Pulmonary/Chest: Effort normal.  Abdominal: Bowel sounds are normal. She exhibits no distension.  Musculoskeletal:  The patient's upper extremities are normal-no clubbing cyanosis or edema. She does have tenderness in the peri-articular region of the left elbow without crepitance passive range of motion painful but normal left elbow stable strength normal muscle tone normal skin normal pulse and perfusion normal.  Right shoulder elbow wrist and hand full range of motion normal stability strength skin and no tenderness or swelling neurovascular exam intact  Right knee exam full range of motion no tenderness or swelling all ligaments were stable strength and muscle tone normal skin intact pulses normal and sensation is normal as well.  Neurological: She is alert and oriented to person, place, and time. She has normal reflexes. She exhibits normal muscle tone. Coordination normal.  Skin: Skin is warm and dry. No rash noted. She is not diaphoretic. No erythema. No pallor.  Psychiatric: She has a normal  mood and affect. Her behavior is normal. Judgment and thought content normal.     Ortho Exam  Left knee lateral joint line tenderness minimal effusion compartments are soft knee range of motion not tested because of pain of felt stable to varus valgus and anterior posterior stability testing muscle tone normal skin intact no ecchymosis pulse and perfusion normal no peripheral edema   ASSESSMENT: My personal interpretation of the images:   X-ray showed a plus lateral tibial plateau fracture CAT scan confirms with about 8 mm of depression  Based on the patient's age and her weight and the fracture I recommend she have this elevated and fixed to preserve joint function try to prevent arthritis but without any guarantee.  I did give her the option of nonoperative treatment but with a high risk of degenerative arthritis    PLAN She decided to proceed with open treatment internal fixation and bone grafting of the left lateral plateau  This procedure has been fully reviewed with the patient and written informed consent has been obtained.

## 2015-07-01 ENCOUNTER — Encounter (HOSPITAL_COMMUNITY)
Admission: AD | Disposition: A | Discharge status: Home Health | Payer: Self-pay | Source: Ambulatory Visit | Attending: Orthopedic Surgery

## 2015-07-01 ENCOUNTER — Ambulatory Visit (HOSPITAL_COMMUNITY): Payer: BLUE CROSS/BLUE SHIELD

## 2015-07-01 ENCOUNTER — Ambulatory Visit (HOSPITAL_COMMUNITY): Payer: BLUE CROSS/BLUE SHIELD | Admitting: Anesthesiology

## 2015-07-01 ENCOUNTER — Encounter (HOSPITAL_COMMUNITY): Payer: Self-pay | Admitting: *Deleted

## 2015-07-01 ENCOUNTER — Inpatient Hospital Stay (HOSPITAL_COMMUNITY)
Admission: AD | Admit: 2015-07-01 | Discharge: 2015-07-03 | DRG: 982 | Disposition: A | Discharge status: Home Health | Payer: BLUE CROSS/BLUE SHIELD | Source: Ambulatory Visit | Attending: Orthopedic Surgery | Admitting: Orthopedic Surgery

## 2015-07-01 DIAGNOSIS — F329 Major depressive disorder, single episode, unspecified: Secondary | ICD-10-CM | POA: Diagnosis present

## 2015-07-01 DIAGNOSIS — Y9289 Other specified places as the place of occurrence of the external cause: Secondary | ICD-10-CM

## 2015-07-01 DIAGNOSIS — R339 Retention of urine, unspecified: Secondary | ICD-10-CM | POA: Diagnosis present

## 2015-07-01 DIAGNOSIS — Z8249 Family history of ischemic heart disease and other diseases of the circulatory system: Secondary | ICD-10-CM | POA: Diagnosis not present

## 2015-07-01 DIAGNOSIS — W1789XA Other fall from one level to another, initial encounter: Secondary | ICD-10-CM | POA: Diagnosis present

## 2015-07-01 DIAGNOSIS — Z6841 Body Mass Index (BMI) 40.0 and over, adult: Secondary | ICD-10-CM

## 2015-07-01 DIAGNOSIS — R000 Tachycardia, unspecified: Secondary | ICD-10-CM | POA: Diagnosis present

## 2015-07-01 DIAGNOSIS — Z91013 Allergy to seafood: Secondary | ICD-10-CM | POA: Diagnosis not present

## 2015-07-01 DIAGNOSIS — Z833 Family history of diabetes mellitus: Secondary | ICD-10-CM | POA: Diagnosis not present

## 2015-07-01 DIAGNOSIS — S82142A Displaced bicondylar fracture of left tibia, initial encounter for closed fracture: Secondary | ICD-10-CM | POA: Diagnosis present

## 2015-07-01 DIAGNOSIS — G8918 Other acute postprocedural pain: Secondary | ICD-10-CM | POA: Diagnosis not present

## 2015-07-01 DIAGNOSIS — Z885 Allergy status to narcotic agent status: Secondary | ICD-10-CM

## 2015-07-01 DIAGNOSIS — S82143A Displaced bicondylar fracture of unspecified tibia, initial encounter for closed fracture: Secondary | ICD-10-CM | POA: Diagnosis present

## 2015-07-01 DIAGNOSIS — I10 Essential (primary) hypertension: Secondary | ICD-10-CM | POA: Diagnosis present

## 2015-07-01 DIAGNOSIS — S82209A Unspecified fracture of shaft of unspecified tibia, initial encounter for closed fracture: Secondary | ICD-10-CM

## 2015-07-01 HISTORY — DX: Thyrotoxicosis, unspecified without thyrotoxic crisis or storm: E05.90

## 2015-07-01 HISTORY — PX: ORIF TIBIA PLATEAU: SHX2132

## 2015-07-01 SURGERY — OPEN REDUCTION INTERNAL FIXATION (ORIF) TIBIAL PLATEAU
Anesthesia: General | Site: Leg Lower | Laterality: Left

## 2015-07-01 MED ORDER — CEFAZOLIN SODIUM-DEXTROSE 2-4 GM/100ML-% IV SOLN
INTRAVENOUS | Status: AC
  Filled 2015-07-01: qty 100

## 2015-07-01 MED ORDER — GLYCOPYRROLATE 0.2 MG/ML IJ SOLN
INTRAMUSCULAR | Status: DC | PRN
  Administered 2015-07-01: 0.6 mg via INTRAVENOUS

## 2015-07-01 MED ORDER — CHLORHEXIDINE GLUCONATE 4 % EX LIQD: 60.0000 mL | Freq: Once | CUTANEOUS | Status: DC

## 2015-07-01 MED ORDER — OXYCODONE-ACETAMINOPHEN 5-325 MG PO TABS: 1.0000 | ORAL_TABLET | ORAL | Status: DC | PRN

## 2015-07-01 MED ORDER — ONDANSETRON HCL 4 MG/2ML IJ SOLN
INTRAMUSCULAR | Status: AC
  Filled 2015-07-01: qty 2

## 2015-07-01 MED ORDER — ARTIFICIAL TEARS OP OINT
TOPICAL_OINTMENT | OPHTHALMIC | Status: AC
  Filled 2015-07-01: qty 7

## 2015-07-01 MED ORDER — PROPOFOL 10 MG/ML IV BOLUS
INTRAVENOUS | Status: AC
  Filled 2015-07-01: qty 20

## 2015-07-01 MED ORDER — FENTANYL CITRATE (PF) 250 MCG/5ML IJ SOLN
INTRAMUSCULAR | Status: AC
  Filled 2015-07-01: qty 5

## 2015-07-01 MED ORDER — DIPHENHYDRAMINE HCL 50 MG/ML IJ SOLN
INTRAMUSCULAR | Status: AC
Start: 2015-07-01 — End: 2015-07-01
  Filled 2015-07-01: qty 1

## 2015-07-01 MED ORDER — ONDANSETRON HCL 4 MG/2ML IJ SOLN: 4.0000 mg | Freq: Once | INTRAMUSCULAR | Status: DC | PRN

## 2015-07-01 MED ORDER — GLYCOPYRROLATE 0.2 MG/ML IJ SOLN
INTRAMUSCULAR | Status: AC
  Filled 2015-07-01: qty 3

## 2015-07-01 MED ORDER — ONDANSETRON HCL 4 MG/2ML IJ SOLN
INTRAMUSCULAR | Status: DC | PRN
  Administered 2015-07-01: 4 mg via INTRAVENOUS

## 2015-07-01 MED ORDER — SODIUM CHLORIDE 0.9 % IR SOLN
Status: DC | PRN
Start: 2015-07-01 — End: 2015-07-01
  Administered 2015-07-01: 1000 mL

## 2015-07-01 MED ORDER — METOPROLOL TARTRATE 1 MG/ML IV SOLN
INTRAVENOUS | Status: AC
  Filled 2015-07-01: qty 5

## 2015-07-01 MED ORDER — SODIUM CHLORIDE 0.9% FLUSH
INTRAVENOUS | Status: AC
  Filled 2015-07-01: qty 10

## 2015-07-01 MED ORDER — NEOSTIGMINE METHYLSULFATE 10 MG/10ML IV SOLN
INTRAVENOUS | Status: DC | PRN
  Administered 2015-07-01: 4 mg via INTRAVENOUS

## 2015-07-01 MED ORDER — DIPHENHYDRAMINE HCL 50 MG/ML IJ SOLN
25.0000 mg | Freq: Once | INTRAMUSCULAR | Status: AC
  Administered 2015-07-01: 25 mg via INTRAVENOUS

## 2015-07-01 MED ORDER — LACTATED RINGERS IV SOLN
INTRAVENOUS | Status: DC
  Administered 2015-07-01: 08:00:00 via INTRAVENOUS
  Administered 2015-07-01: 500 mL via INTRAVENOUS
  Administered 2015-07-01: 10:00:00 via INTRAVENOUS

## 2015-07-01 MED ORDER — FENTANYL CITRATE (PF) 100 MCG/2ML IJ SOLN
INTRAMUSCULAR | Status: DC | PRN
  Administered 2015-07-01 (×6): 50 ug via INTRAVENOUS
  Administered 2015-07-01: 100 ug via INTRAVENOUS
  Administered 2015-07-01 (×4): 50 ug via INTRAVENOUS

## 2015-07-01 MED ORDER — LIDOCAINE HCL (PF) 1 % IJ SOLN
INTRAMUSCULAR | Status: AC
  Filled 2015-07-01: qty 5

## 2015-07-01 MED ORDER — METOPROLOL TARTRATE 1 MG/ML IV SOLN
INTRAVENOUS | Status: DC | PRN
  Administered 2015-07-01 (×2): 1 mg via INTRAVENOUS

## 2015-07-01 MED ORDER — OXYCODONE-ACETAMINOPHEN 5-325 MG PO TABS
1.0000 | ORAL_TABLET | ORAL | Status: DC | PRN
Start: 2015-07-01 — End: 2015-07-03
  Administered 2015-07-01 – 2015-07-02 (×3): 1 via ORAL
  Filled 2015-07-01 (×3): qty 1

## 2015-07-01 MED ORDER — MIDAZOLAM HCL 2 MG/2ML IJ SOLN
1.0000 mg | INTRAMUSCULAR | Status: DC | PRN
  Administered 2015-07-01: 2 mg via INTRAVENOUS

## 2015-07-01 MED ORDER — ROCURONIUM BROMIDE 50 MG/5ML IV SOLN
INTRAVENOUS | Status: AC
  Filled 2015-07-01: qty 2

## 2015-07-01 MED ORDER — MIDAZOLAM HCL 2 MG/2ML IJ SOLN
INTRAMUSCULAR | Status: AC
  Filled 2015-07-01: qty 2

## 2015-07-01 MED ORDER — LACTATED RINGERS IV SOLN: INTRAVENOUS | Status: DC

## 2015-07-01 MED ORDER — MORPHINE SULFATE (PF) 4 MG/ML IV SOLN
4.0000 mg | INTRAVENOUS | Status: DC | PRN
  Administered 2015-07-01 – 2015-07-02 (×6): 4 mg via INTRAVENOUS
  Filled 2015-07-01 (×6): qty 1

## 2015-07-01 MED ORDER — FENTANYL CITRATE (PF) 100 MCG/2ML IJ SOLN
25.0000 ug | INTRAMUSCULAR | Status: DC | PRN
  Administered 2015-07-01 (×2): 50 ug via INTRAVENOUS

## 2015-07-01 MED ORDER — METOPROLOL TARTRATE 1 MG/ML IV SOLN
1.0000 mg | INTRAVENOUS | Status: DC | PRN
  Administered 2015-07-01: 1 mg via INTRAVENOUS

## 2015-07-01 MED ORDER — PROMETHAZINE HCL 12.5 MG PO TABS: 12.5000 mg | ORAL_TABLET | Freq: Four times a day (QID) | ORAL | Status: DC | PRN

## 2015-07-01 MED ORDER — ROCURONIUM BROMIDE 100 MG/10ML IV SOLN
INTRAVENOUS | Status: DC | PRN
  Administered 2015-07-01: 10 mg via INTRAVENOUS
  Administered 2015-07-01: 50 mg via INTRAVENOUS
  Administered 2015-07-01: 10 mg via INTRAVENOUS

## 2015-07-01 MED ORDER — ONDANSETRON HCL 4 MG/2ML IJ SOLN
4.0000 mg | Freq: Four times a day (QID) | INTRAMUSCULAR | Status: DC
  Administered 2015-07-01 – 2015-07-03 (×5): 4 mg via INTRAVENOUS
  Filled 2015-07-01 (×5): qty 2

## 2015-07-01 MED ORDER — LIDOCAINE HCL 1 % IJ SOLN
INTRAMUSCULAR | Status: DC | PRN
  Administered 2015-07-01: 30 mg via INTRADERMAL

## 2015-07-01 MED ORDER — PROPOFOL 10 MG/ML IV BOLUS
INTRAVENOUS | Status: DC | PRN
  Administered 2015-07-01: 160 mg via INTRAVENOUS

## 2015-07-01 MED ORDER — FENTANYL CITRATE (PF) 100 MCG/2ML IJ SOLN
INTRAMUSCULAR | Status: AC
  Filled 2015-07-01: qty 2

## 2015-07-01 MED ORDER — BUPIVACAINE-EPINEPHRINE (PF) 0.5% -1:200000 IJ SOLN
INTRAMUSCULAR | Status: AC
  Filled 2015-07-01: qty 60

## 2015-07-01 MED ORDER — FENTANYL CITRATE (PF) 100 MCG/2ML IJ SOLN
25.0000 ug | INTRAMUSCULAR | Status: DC | PRN
  Administered 2015-07-01 (×4): 50 ug via INTRAVENOUS

## 2015-07-01 MED ORDER — CEFAZOLIN SODIUM-DEXTROSE 2-4 GM/100ML-% IV SOLN
2.0000 g | INTRAVENOUS | Status: AC
  Administered 2015-07-01: 2 g via INTRAVENOUS

## 2015-07-01 MED ORDER — NEOSTIGMINE METHYLSULFATE 10 MG/10ML IV SOLN
INTRAVENOUS | Status: AC
  Filled 2015-07-01: qty 1

## 2015-07-01 MED ORDER — MIDAZOLAM HCL 5 MG/5ML IJ SOLN
INTRAMUSCULAR | Status: DC | PRN
  Administered 2015-07-01: 2 mg via INTRAVENOUS

## 2015-07-01 MED ORDER — ONDANSETRON HCL 4 MG/2ML IJ SOLN
4.0000 mg | Freq: Once | INTRAMUSCULAR | Status: AC
  Administered 2015-07-01: 4 mg via INTRAVENOUS

## 2015-07-01 MED ORDER — BUPIVACAINE-EPINEPHRINE 0.5% -1:200000 IJ SOLN
INTRAMUSCULAR | Status: DC | PRN
  Administered 2015-07-01: 60 mL

## 2015-07-01 SURGICAL SUPPLY — 102 items
BAG HAMPER (MISCELLANEOUS) ×3 IMPLANT
BANDAGE ELASTIC 4 VELCRO NS (GAUZE/BANDAGES/DRESSINGS) ×3 IMPLANT
BANDAGE ELASTIC 6 VELCRO NS (GAUZE/BANDAGES/DRESSINGS) ×3 IMPLANT
BANDAGE ESMARK 4X12 BL STRL LF (DISPOSABLE) ×1 IMPLANT
BANDAGE ESMARK 6X9 LF (GAUZE/BANDAGES/DRESSINGS) ×1 IMPLANT
BIT DRILL CALIBR QC 2.5X250 (BIT) ×3 IMPLANT
BIT DRILL PERC QC 2.8X200 100 (BIT) ×1 IMPLANT
BLADE 10 SAFETY STRL DISP (BLADE) IMPLANT
BLADE 15 SAFETY STRL DISP (BLADE) IMPLANT
BLADE SURG 15 STRL LF DISP TIS (BLADE) ×1 IMPLANT
BLADE SURG 15 STRL SS (BLADE) ×2
BLADE SURG SZ10 CARB STEEL (BLADE) ×3 IMPLANT
BNDG COHESIVE 4X5 TAN STRL (GAUZE/BANDAGES/DRESSINGS) ×3 IMPLANT
BNDG ESMARK 4X12 BLUE STRL LF (DISPOSABLE) ×3
BNDG ESMARK 6X9 LF (GAUZE/BANDAGES/DRESSINGS) ×3
BONE INJECT NORIAN 5CC STRL (Bone Implant) ×3 IMPLANT
CHLORAPREP W/TINT 26ML (MISCELLANEOUS) ×6 IMPLANT
CLOTH BEACON ORANGE TIMEOUT ST (SAFETY) ×3 IMPLANT
COVER LIGHT HANDLE STERIS (MISCELLANEOUS) ×9 IMPLANT
COVER MAYO STAND XLG (DRAPE) ×3 IMPLANT
CUFF TOURNIQUET SINGLE 34IN LL (TOURNIQUET CUFF) ×3 IMPLANT
DRAIN TROCAR  MED 1/8 (DRAIN) IMPLANT
DRAPE C-ARM FOLDED MOBILE STRL (DRAPES) ×3 IMPLANT
DRAPE EXTREMITY T 121X128X90 (DRAPE) ×3 IMPLANT
DRAPE HALF SHEET 40X57 (DRAPES) ×6 IMPLANT
DRAPE INCISE IOBAN 66X45 STRL (DRAPES) IMPLANT
DRAPE LAPAROTOMY TRNSV 102X78 (DRAPE) ×3 IMPLANT
DRILL BIT QUICK COUP 2.8MM 100 (BIT) ×2
ELECT REM PT RETURN 9FT ADLT (ELECTROSURGICAL) ×3
ELECTRODE REM PT RTRN 9FT ADLT (ELECTROSURGICAL) ×1 IMPLANT
GAUZE SPONGE 4X4 16PLY XRAY LF (GAUZE/BANDAGES/DRESSINGS) IMPLANT
GAUZE XEROFORM 5X9 LF (GAUZE/BANDAGES/DRESSINGS) ×3 IMPLANT
GLOVE BIO SURGEON STRL SZ7 (GLOVE) ×6 IMPLANT
GLOVE BIOGEL PI IND STRL 7.0 (GLOVE) ×3 IMPLANT
GLOVE BIOGEL PI IND STRL 7.5 (GLOVE) ×1 IMPLANT
GLOVE BIOGEL PI INDICATOR 7.0 (GLOVE) ×6
GLOVE BIOGEL PI INDICATOR 7.5 (GLOVE) ×2
GLOVE EXAM NITRILE MD LF STRL (GLOVE) ×3 IMPLANT
GLOVE SKINSENSE NS SZ8.0 LF (GLOVE) ×2
GLOVE SKINSENSE STRL SZ8.0 LF (GLOVE) ×1 IMPLANT
GLOVE SS N UNI LF 8.5 STRL (GLOVE) ×3 IMPLANT
GOWN STRL REUS W/TWL LRG LVL3 (GOWN DISPOSABLE) ×9 IMPLANT
GOWN STRL REUS W/TWL XL LVL3 (GOWN DISPOSABLE) ×3 IMPLANT
IMMOBILIZER KNEE 19 UNV (ORTHOPEDIC SUPPLIES) ×3 IMPLANT
INST SET MINOR BONE (KITS) IMPLANT
K-WIRE 229MX1.6 (WIRE) IMPLANT
K-WIRE DBL PT .062 (WIRE) ×6 IMPLANT
K-WIRE SGL PT/SMTH 9X35 (WIRE)
KIT BLADEGUARD II DBL (SET/KITS/TRAYS/PACK) ×3 IMPLANT
KIT ROOM TURNOVER APOR (KITS) ×3 IMPLANT
KWIRE SGL PT/SMTH 9X35 (WIRE) IMPLANT
KWIRE SGL PT/SMTH 9X45IN (WIRE) IMPLANT
MANIFOLD NEPTUNE II (INSTRUMENTS) ×3 IMPLANT
MARKER SKIN DUAL TIP RULER LAB (MISCELLANEOUS) ×3 IMPLANT
NEEDLE DEL STER SRS 12GX7.5CM (NEEDLE) ×3 IMPLANT
NEEDLE HYPO 21X1.5 SAFETY (NEEDLE) ×3 IMPLANT
NEEDLE MA TROC 1/2 (NEEDLE) IMPLANT
NS IRRIG 1000ML POUR BTL (IV SOLUTION) ×3 IMPLANT
PACK BASIC III (CUSTOM PROCEDURE TRAY)
PACK SRG BSC III STRL LF ECLPS (CUSTOM PROCEDURE TRAY) IMPLANT
PAD ABD 5X9 TENDERSORB (GAUZE/BANDAGES/DRESSINGS) IMPLANT
PAD ARMBOARD 7.5X6 YLW CONV (MISCELLANEOUS) ×3 IMPLANT
PAD CAST 4YDX4 CTTN HI CHSV (CAST SUPPLIES) IMPLANT
PADDING CAST COTTON 4X4 STRL (CAST SUPPLIES)
PADDING CAST COTTON 6X4 STRL (CAST SUPPLIES) IMPLANT
PADDING WEBRIL 4 STERILE (GAUZE/BANDAGES/DRESSINGS) ×3 IMPLANT
PADDING WEBRIL 6 STERILE (GAUZE/BANDAGES/DRESSINGS) ×3 IMPLANT
PASSER SUT SWANSON 36MM LOOP (INSTRUMENTS) IMPLANT
PENCIL HANDSWITCHING (ELECTRODE) ×3 IMPLANT
PLATE PROX TIB LT 3.5 VA-LCP (Plate) ×3 IMPLANT
SCREW 3.5X60 (Screw) ×3 IMPLANT
SCREW CORTEX 3.5 36MM (Screw) ×2 IMPLANT
SCREW CORTEX 3.5 40MM (Screw) ×2 IMPLANT
SCREW CORTEX 3.5 65MM (Screw) ×6 IMPLANT
SCREW LOCK CORT ST 3.5X36 (Screw) ×1 IMPLANT
SCREW LOCK CORT ST 3.5X40 (Screw) ×1 IMPLANT
SCREW LOCK ST STARDR 3.5X54 (Screw) ×1 IMPLANT
SCREW LOCKING 3.5X54MM VA (Screw) ×2 IMPLANT
SCREW LOCKING VA 3.5X50MM (Screw) ×3 IMPLANT
SET BASIN LINEN APH (SET/KITS/TRAYS/PACK) ×3 IMPLANT
SPLINT IMMOBILIZER J 3INX20FT (CAST SUPPLIES) ×2
SPLINT J IMMOBILIZER 3X20FT (CAST SUPPLIES) ×1 IMPLANT
SPONGE GAUZE 4X4 12PLY (GAUZE/BANDAGES/DRESSINGS) ×3 IMPLANT
SPONGE LAP 18X18 X RAY DECT (DISPOSABLE) ×3 IMPLANT
SPONGE LAP 4X18 X RAY DECT (DISPOSABLE) IMPLANT
STAPLER VISISTAT 35W (STAPLE) ×3 IMPLANT
STOCKINETTE IMPERVIOUS LG (DRAPES) ×3 IMPLANT
SUT BRALON NAB BRD #1 30IN (SUTURE) ×3 IMPLANT
SUT CHROMIC 0 CTX 36 (SUTURE) IMPLANT
SUT MNCRL 0 VIOLET CTX 36 (SUTURE) ×1 IMPLANT
SUT MON AB 0 CT1 (SUTURE) ×3 IMPLANT
SUT MON AB 2-0 SH 27 (SUTURE)
SUT MON AB 2-0 SH27 (SUTURE) IMPLANT
SUT MONOCRYL 0 CTX 36 (SUTURE) ×2
SUT NUROLON CT 2 BLK #1 18IN (SUTURE) IMPLANT
SUT PLAIN 2 0 XLH (SUTURE) IMPLANT
SUT PLAIN 3 0 FS2 (SUTURE) IMPLANT
SUT SILK 0 FSL (SUTURE) IMPLANT
SUT VIC AB 1 CT1 27 (SUTURE) ×6
SUT VIC AB 1 CT1 27XBRD ANTBC (SUTURE) ×3 IMPLANT
SYR 30ML LL (SYRINGE) ×3 IMPLANT
SYR BULB IRRIGATION 50ML (SYRINGE) ×6 IMPLANT

## 2015-07-01 NOTE — Interval H&P Note (Signed)
History and Physical Interval Note:  07/01/2015 8:48 AM  [name]  has presented today for surgery, with the diagnosis of LEFT TIBIA PLATEAU FRACTURE  The various methods of treatment have been discussed with the patient and family. After consideration of risks, benefits and other options for treatment, the patient has consented to  Procedure(s) with comments: OPEN REDUCTION INTERNAL FIXATION (ORIF) TIBIAL PLATEAU (Left) - BONE GRAFT LEFT TIBEAL PLATEAU - pt knows to arrive at 7:30 as a surgical intervention .  The patient's history has been reviewed, patient examined, no change in status, stable for surgery.  I have reviewed the patient's chart and labs.  Questions were answered to the patient's satisfaction.     [name]

## 2015-07-01 NOTE — Consult Note (Signed)
Reason for Consult: Tachycardia Referring Physician: Dr. Aline Brochure Cardiologist new Dr. Karie Soda is an 30 y.o. female.  HPI: This is a 30 year old female patient with no prior cardiac history. She underwent surgery for tibial fracture today and had some tachycardia postop. She has history of hypertension hasn't needed meds recently, depression, obesity. She's never had problems with anesthesia in the past. Says her leg is really hurting from surgery. EKG NSR with nonspecific ST changes, no acute change. Telemetry shows sinus tachycardia at 150/m from PACU but now NSR 80-90/m. Denies chest pain, palpitations, dyspnea, dyspnea on exertion dizziness or presyncope. Just started going to gym trying to lose weight. No family history of CAD or smoking history. No DM.  Past Medical History  Diagnosis Date  . Hypertension   . Migraines   . Preterm delivery   . Depression   . UTI (lower urinary tract infection)   . Obesity   . Seasonal rhinitis   . Fibrocystic breast changes   . Nexplanon insertion 11/29/2012    inserted nexplanon 11/29/12 in left arm, remove 11/30/15  . Abnormal uterine bleeding (AUB) 01/19/2015  . Nexplanon in place 01/19/2015  . Complication of anesthesia   . Family history of adverse reaction to anesthesia   . Hyperthyroidism     Past Surgical History  Procedure Laterality Date  . Cholecystectomy    . Tonsillectomy      Family History  Problem Relation Age of Onset  . Hypertension Mother   . Hypertension Father   . Hypertension Maternal Grandmother   . Hypertension Maternal Grandfather   . Diabetes Paternal Grandmother   . Hypertension Paternal Grandmother   . Heart disease Paternal Grandfather   . Diabetes Paternal Grandfather   . Hypertension Paternal Grandfather     Social History:  reports that she has never smoked. She has never used smokeless tobacco. She reports that she does not drink alcohol or use illicit drugs.  Allergies:   Allergies  Allergen Reactions  . Codeine Other (See Comments)    Does not like the way it makes her feel.  . Dilaudid [Hydromorphone Hcl] Hives and Itching  . Shellfish Allergy Rash    Medications:  Scheduled Meds: Continuous Infusions: PRN Meds:.   Results for orders placed or performed during the hospital encounter of 06/29/15 (from the past 48 hour(s))  CBC     Status: None   Collection Time: 06/29/15  3:40 PM  Result Value Ref Range   WBC 10.0 4.0 - 10.5 K/uL   RBC 4.34 3.87 - 5.11 MIL/uL   Hemoglobin 13.1 12.0 - 15.0 g/dL   HCT 39.2 36.0 - 46.0 %   MCV 90.3 78.0 - 100.0 fL   MCH 30.2 26.0 - 34.0 pg   MCHC 33.4 30.0 - 36.0 g/dL   RDW 12.1 11.5 - 15.5 %   Platelets 286 150 - 400 K/uL  Basic metabolic panel     Status: Abnormal   Collection Time: 06/29/15  3:40 PM  Result Value Ref Range   Sodium 139 135 - 145 mmol/L   Potassium 4.1 3.5 - 5.1 mmol/L   Chloride 109 101 - 111 mmol/L   CO2 21 (L) 22 - 32 mmol/L   Glucose, Bld 94 65 - 99 mg/dL   BUN 15 6 - 20 mg/dL   Creatinine, Ser 0.68 0.44 - 1.00 mg/dL   Calcium 9.1 8.9 - 10.3 mg/dL   GFR calc non Af Amer >60 >60 mL/min   GFR  calc Af Amer >60 >60 mL/min    Comment: (NOTE) The eGFR has been calculated using the CKD EPI equation. This calculation has not been validated in all clinical situations. eGFR's persistently <60 mL/min signify possible Chronic Kidney Disease.    Anion gap 9 5 - 15    Dg Knee 1-2 Views Left  07/01/2015  CLINICAL DATA:  Left tibial plateau fracture.  Subsequent encounter EXAM: DG C-ARM 61-120 MIN; LEFT KNEE - 1-2 VIEW COMPARISON:  Radiography from 3 days ago FINDINGS: Fluoroscopy shows reduction of a lateral tibial plateau fracture with lateral fixation. No evidence of intraoperative fracture. Normal joint alignment. IMPRESSION: Fluoroscopy for tibial plateau fracture ORIF. Electronically Signed   By: Monte Fantasia M.D.   On: 07/01/2015 11:36   Dg C-arm 61-120 Min  07/01/2015  CLINICAL  DATA:  Left tibial plateau fracture.  Subsequent encounter EXAM: DG C-ARM 61-120 MIN; LEFT KNEE - 1-2 VIEW COMPARISON:  Radiography from 3 days ago FINDINGS: Fluoroscopy shows reduction of a lateral tibial plateau fracture with lateral fixation. No evidence of intraoperative fracture. Normal joint alignment. IMPRESSION: Fluoroscopy for tibial plateau fracture ORIF. Electronically Signed   By: Monte Fantasia M.D.   On: 07/01/2015 11:36    ROS  See HPI Eyes: Negative Ears:Negative for hearing loss, tinnitus Cardiovascular: Negative for chest pain, palpitations,irregular heartbeat, dyspnea, dyspnea on exertion, near-syncope, orthopnea, paroxysmal nocturnal dyspnea and syncope,edema, claudication, cyanosis,.  Respiratory:   Negative for cough, hemoptysis, shortness of breath, sleep disturbances due to breathing, sputum production and wheezing.   Endocrine: Negative for cold intolerance and heat intolerance.  Hematologic/Lymphatic: Negative for adenopathyDoes not bruise/bleed easily.  Musculoskeletal: tibia fracture Gastrointestinal: Negative for nausea, vomiting, reflux, abdominal pain, diarrhea, constipation.   Genitourinary: Negative for bladder incontinence, dysuria, flank pain, frequency, hematuria, hesitancy, nocturia and urgency.  Neurological: Negative.  Allergic/Immunologic: Negative for environmental allergies.  Blood pressure 130/78, pulse 91, temperature 98.6 F (37 C), temperature source Oral, resp. rate 15, last menstrual period 06/03/2015, SpO2 96 %. Physical Exam PHYSICAL EXAM: Well-nournished, in no acute distress. Neck: No JVD, HJR, Bruit, or thyroid enlargement Lungs: No tachypnea, clear without wheezing, rales, or rhonchi Cardiovascular: RRR, PMI not displaced, heart sounds normal, no murmurs, gallops, bruit, thrill, or heave. Abdomen: BS normal. Soft without organomegaly, masses, lesions or tenderness. Extremities: without cyanosis, clubbing or edema. Good distal pulses  bilateral SKin: Warm, no lesions or rashes  Musculoskeletal: No deformities Neuro: no focal signs  EKG shows normal sinus rhythm with nonspecific ST-T wave changes  Assessment/Plan:  Status post tibial fracture surgery  Postop sinus tachycardia suspect secondary to pain. Currently NSR. Doubt she'll need anything long term once pain is controlled. Could use low dose beta blocker while in hospital. Will discuss with MD.  Obesity trying to exercise and lose weight.  Hypertension-hasn't needed meds recently  Depression Ermalinda Barrios 07/01/2015, 2:31 PM   The patient was seen and examined, and I agree with the history, physical exam, assessment and plan as documented above which has been discussed with Gerrianne Scale PA-C, with modifications as noted below. Pt developed sinus tachycardia post-operatively after tibial fracture surgery, driven by pain and about to receive IV morphine during the time of my evaluation. Not anemic. This represents a physiologic tachycardia in response to post-operative pain. HR currently normal, maintaining sinus rhythm. Would not treat with IV beta blockers, with sole aim to control pain. No cardiac testing indicated. No further recommendations.  Kate Sable, MD, [facility]  07/01/2015 5:04 PM

## 2015-07-01 NOTE — Progress Notes (Signed)
Transferred to rm 317 with cardiac monitors in place.

## 2015-07-01 NOTE — Progress Notes (Signed)
Awake. Pulse 127. Metoprolol 1 mg IV given.

## 2015-07-01 NOTE — Anesthesia Preprocedure Evaluation (Addendum)
Anesthesia Evaluation  Patient identified by MRN, date of birth, ID band Patient awake    Reviewed: Allergy & Precautions, H&P , NPO status , Patient's Chart, lab work & pertinent test results  History of Anesthesia Complications Negative for: history of anesthetic complications  Airway Mallampati: II  TM Distance: >3 FB Neck ROM: full    Dental no notable dental hx. (+) Teeth Intact   Pulmonary neg pulmonary ROS,    Pulmonary exam normal breath sounds clear to auscultation       Cardiovascular hypertension (taken off meds), negative cardio ROS   Rhythm:regular Rate:Normal     Neuro/Psych  Headaches, PSYCHIATRIC DISORDERS Depression    GI/Hepatic negative GI ROS,   Endo/Other  Morbid obesity  Renal/GU      Musculoskeletal   Abdominal Normal abdominal exam  (+)   Peds  Hematology   Anesthesia Other Findings   Reproductive/Obstetrics                             Anesthesia Physical Anesthesia Plan  ASA: II  Anesthesia Plan: General   Post-op Pain Management:    Induction: Intravenous, Rapid sequence and Cricoid pressure planned  Airway Management Planned: Oral ETT  Additional Equipment:   Intra-op Plan:   Post-operative Plan: Extubation in OR  Informed Consent: I have reviewed the patients History and Physical, chart, labs and discussed the procedure including the risks, benefits and alternatives for the proposed anesthesia with the patient or authorized representative who has indicated his/her understanding and acceptance.     Plan Discussed with:   Anesthesia Plan Comments:         Anesthesia Quick Evaluation

## 2015-07-01 NOTE — Brief Op Note (Addendum)
07/01/2015  11:34 AM  PATIENT:  [name]  30 y.o. female  PRE-OPERATIVE DIAGNOSIS:  LEFT TIBIA PLATEAU FRACTURE  POST-OPERATIVE DIAGNOSIS:  LEFT TIBIA PLATEAU FRACTURE split depression  PROCEDURE:  Procedure(s): OPEN REDUCTION INTERNAL FIXATION (ORIF) TIBIAL PLATEAU AND BONE GRAFT LEFT TIBIAL PLATEAU (Left) Implants Synthes periarticular 3.5 proximal tibia plate Synthes calcium phosphate bone substitute  SURGEON:  Surgeon(s) and Role:    * Vickki HearingStanley E Harrison, MD - Primary  PHYSICIAN ASSISTANT:   ASSISTANTS: Cromwell NationBetty Ashley   ANESTHESIA:   general  EBL:  Total I/O In: 1400 [I.V.:1400] Out: 10 [Blood:10]  BLOOD ADMINISTERED:none  DRAINS: none   LOCAL MEDICATIONS USED:  MARCAINE     SPECIMEN:  No Specimen  DISPOSITION OF SPECIMEN:  N/A  COUNTS:  YES  TOURNIQUET:   Total Tourniquet Time Documented: Thigh (Left) - 93 minutes Total: Thigh (Left) - 93 minutes   DICTATION: .Reubin Milanragon Dictation  PLAN OF CARE: Discharge to home after PACU  PATIENT DISPOSITION:  PACU - hemodynamically stable.   Delay start of Pharmacological VTE agent (>24hrs) due to surgical blood loss or risk of bleeding: not applicable  Details of procedure  In the preop area and we talked to the patient confirmed the surgical site marked and updated chart reviewed the CT scan x-rays and history and physical. She was taken to the operating room.  In the operating room she had general anesthesia she was in the supine position. We placed a tourniquet on her left thigh we prepped her leg sterilely draped sterilely.  We took a timeout I reviewed the implants radiographs and the surgical team agreed on the procedure as listed above.  We exsanguinated the limb with a six-inch Esmarch. We then elevated the tourniquet to 300 mmHg. We made a lazy S incision starting at the crest of the tibia and curving it proximally and then extending it proximally. We created a full-thickness flap down to the fascia. We  removed the anterior compartment from the proximal tibia opened up the deep fascia then made an arthrotomy below the meniscus. We tagged the meniscus with 2 #1 Vicryl sutures there was no meniscal tear. The fracture was depressed and there was a horizontal fracture line as well in the joint. There was minimal vertical split. I was able to see all the way back to the back corner of the knee.  We made a cortical window and we elevated both fracture fragments until they were anatomically reduced and held with 2 K wires. We checked the position of the 3.5 mm proximal periarticular tibial plate. We passed 2 limbs of the Vicryl sutures holding the meniscus and applied the plate to bone. We injected the calcium phosphate through the cortical window. We did one proximal nonlocking screw to compress the width of the proximal tibia to match the condyles of the femur we did 2 nonlocking screws in the shaft to bring the plate down to bone we then did nonlocking screws proximally checking x-rays at each step. We got an excellent reduction and excellent bone void filler position.  We then checked our lateral x-rays and I was satisfied with that.  We irrigated the wound did a anterior compartment fasciotomy. We closed with 0 Monocryl in 2 layers not closing the fascia we also tied down the sutures for the meniscus with sutures  through the plate.  We closed the subcutaneous tissue with 0 Monocryl and applied staples. We injected 60 mL of Marcaine around the incision the deep fascia.  Sterile dressing and immobilizer applied  Postop plan No weightbearing for the first 6 weeks  Staples out at 2 weeks  Immediate range of motion starting after first office visit. Hinged knee brace during first office. Visit.  X-ray at 2, 6 and 12 weeks

## 2015-07-01 NOTE — Progress Notes (Signed)
Needs to void. Placed on bedpan. Unable to void. Bedpan removed.

## 2015-07-01 NOTE — Progress Notes (Signed)
Attempted to talk with husband to update. Husband very upset and mad that he has had to wait to see his wife. "I  was told that she would be going home at 1300. No one has talked to me." "If you are not in management then you are wasting my time." Joni Reiningicole informed of situation.

## 2015-07-01 NOTE — Progress Notes (Signed)
Dr Romeo AppleHarrison notified of pt status and tachycardia. Order given for admit to obs and cardiology consult. Orders entered.

## 2015-07-01 NOTE — Op Note (Signed)
07/01/2015  11:34 AM  PATIENT:  [name]  30 y.o. female  PRE-OPERATIVE DIAGNOSIS:  LEFT TIBIA PLATEAU FRACTURE  POST-OPERATIVE DIAGNOSIS:  LEFT TIBIA PLATEAU FRACTURE split depression  PROCEDURE:  Procedure(s): OPEN REDUCTION INTERNAL FIXATION (ORIF) TIBIAL PLATEAU AND BONE GRAFT LEFT TIBIAL PLATEAU (Left) Implants Synthes periarticular 3.5 proximal tibia plate Synthes calcium phosphate bone substitute  SURGEON:  Surgeon(s) and Role:    * Saanya Zieske E Devany Aja, MD - Primary  PHYSICIAN ASSISTANT:   ASSISTANTS: Betty Ashley   ANESTHESIA:   general  EBL:  Total I/O In: 1400 [I.V.:1400] Out: 10 [Blood:10]  BLOOD ADMINISTERED:none  DRAINS: none   LOCAL MEDICATIONS USED:  MARCAINE     SPECIMEN:  No Specimen  DISPOSITION OF SPECIMEN:  N/A  COUNTS:  YES  TOURNIQUET:   Total Tourniquet Time Documented: Thigh (Left) - 93 minutes Total: Thigh (Left) - 93 minutes   DICTATION: .Dragon Dictation  PLAN OF CARE: Discharge to home after PACU  PATIENT DISPOSITION:  PACU - hemodynamically stable.   Delay start of Pharmacological VTE agent (>24hrs) due to surgical blood loss or risk of bleeding: not applicable  Details of procedure  In the preop area and we talked to the patient confirmed the surgical site marked and updated chart reviewed the CT scan x-rays and history and physical. She was taken to the operating room.  In the operating room she had general anesthesia she was in the supine position. We placed a tourniquet on her left thigh we prepped her leg sterilely draped sterilely.  We took a timeout I reviewed the implants radiographs and the surgical team agreed on the procedure as listed above.  We exsanguinated the limb with a six-inch Esmarch. We then elevated the tourniquet to 300 mmHg. We made a lazy S incision starting at the crest of the tibia and curving it proximally and then extending it proximally. We created a full-thickness flap down to the fascia. We  removed the anterior compartment from the proximal tibia opened up the deep fascia then made an arthrotomy below the meniscus. We tagged the meniscus with 2 #1 Vicryl sutures there was no meniscal tear. The fracture was depressed and there was a horizontal fracture line as well in the joint. There was minimal vertical split. I was able to see all the way back to the back corner of the knee.  We made a cortical window and we elevated both fracture fragments until they were anatomically reduced and held with 2 K wires. We checked the position of the 3.5 mm proximal periarticular tibial plate. We passed 2 limbs of the Vicryl sutures holding the meniscus and applied the plate to bone. We injected the calcium phosphate through the cortical window. We did one proximal nonlocking screw to compress the width of the proximal tibia to match the condyles of the femur we did 2 nonlocking screws in the shaft to bring the plate down to bone we then did nonlocking screws proximally checking x-rays at each step. We got an excellent reduction and excellent bone void filler position.  We then checked our lateral x-rays and I was satisfied with that.  We irrigated the wound did a anterior compartment fasciotomy. We closed with 0 Monocryl in 2 layers not closing the fascia we also tied down the sutures for the meniscus with sutures  through the plate.  We closed the subcutaneous tissue with 0 Monocryl and applied staples. We injected 60 mL of Marcaine around the incision the deep fascia.    Sterile dressing and immobilizer applied  Postop plan No weightbearing for the first 6 weeks  Staples out at 2 weeks  Immediate range of motion starting after first office visit. Hinged knee brace during first office. Visit.  X-ray at 2, 6 and 12 weeks  

## 2015-07-01 NOTE — Progress Notes (Signed)
Cardiology notified of consult

## 2015-07-01 NOTE — Progress Notes (Addendum)
After administration of versed, IV site noted to be reddened, however good blood return noted.  Cool cloth applied.  Dr Jayme CloudGonzalez notified and order taken for Benadryl 25 mg.

## 2015-07-01 NOTE — Anesthesia Postprocedure Evaluation (Signed)
Anesthesia Post Note  Patient: Fabio AsaJessica L Hemric  Procedure(s) Performed: Procedure(s) (LRB): OPEN REDUCTION INTERNAL FIXATION (ORIF) TIBIAL PLATEAU AND BONE GRAFT LEFT TIBIAL PLATEAU (Left)  Patient location during evaluation: PACU Anesthesia Type: General Level of consciousness: awake and alert and oriented Pain management: pain level controlled Vital Signs Assessment: post-procedure vital signs reviewed and stable Respiratory status: spontaneous breathing and respiratory function stable Cardiovascular status: tachycardic and stable Postop Assessment: no signs of nausea or vomiting Anesthetic complications: no    Last Vitals:  Filed Vitals:   07/01/15 0915 07/01/15 1145  BP: 141/86 131/74  Temp:  37 C  Resp: 24 18    Last Pain:  Filed Vitals:   07/01/15 1154  PainSc: 8                  ADAMS, AMY A

## 2015-07-01 NOTE — Progress Notes (Signed)
Husband at bedside to see pt.

## 2015-07-01 NOTE — Transfer of Care (Addendum)
Immediate Anesthesia Transfer of Care Note  Patient: [name]  Procedure(s) Performed: Procedure(s): OPEN REDUCTION INTERNAL FIXATION (ORIF) TIBIAL PLATEAU AND BONE GRAFT LEFT TIBIAL PLATEAU (Left)  Patient Location: PACU  Anesthesia Type:General  Level of Consciousness: awake, alert , oriented and patient cooperative  Airway & Oxygen Therapy: Patient connected to nasal cannula oxygen  Post-op Assessment: Report given to RN and Post -op Vital signs reviewed and stable  Post vital signs: Patient with intermittent tachycardia not associated with pain. Patient denies sob, dizziness, chest pain.  BP stable.  Dr. Marcos EkeGonzales at bedside; Lopressor IV given  Last Vitals:  Filed Vitals:   07/01/15 0845 07/01/15 0850  BP: 141/74 148/85  Temp:    Resp: 17 19    Complications: No apparent anesthesia complications

## 2015-07-01 NOTE — Progress Notes (Signed)
Awake. Moves left toes without difficulty. Left toes pink in color/warm to touch. Left leg elevated. Ice pack to left knee. Transferred to 317 in good condition.

## 2015-07-01 NOTE — Anesthesia Procedure Notes (Signed)
Procedure Name: Intubation Date/Time: 07/01/2015 9:32 AM Performed by: Despina HiddenIDACAVAGE, Suzette Flagler J Pre-anesthesia Checklist: Emergency Drugs available, Patient identified, Suction available and Patient being monitored Patient Re-evaluated:Patient Re-evaluated prior to inductionOxygen Delivery Method: Circle system utilized Preoxygenation: Pre-oxygenation with 100% oxygen Intubation Type: IV induction and Cricoid Pressure applied Ventilation: Mask ventilation without difficulty and Oral airway inserted - appropriate to patient size Laryngoscope Size: Mac and 3 Grade View: Grade II Tube type: Oral Tube size: 7.0 mm Number of attempts: 1 Airway Equipment and Method: Stylet and Oral airway Placement Confirmation: ETT inserted through vocal cords under direct vision,  positive ETCO2 and breath sounds checked- equal and bilateral Secured at: 22 cm Tube secured with: Tape Dental Injury: Teeth and Oropharynx as per pre-operative assessment

## 2015-07-02 MED ORDER — HEPARIN SODIUM (PORCINE) 5000 UNIT/ML IJ SOLN
5000.0000 [IU] | Freq: Three times a day (TID) | INTRAMUSCULAR | Status: DC
  Administered 2015-07-02 – 2015-07-03 (×3): 5000 [IU] via SUBCUTANEOUS
  Filled 2015-07-02 (×3): qty 1

## 2015-07-02 MED ORDER — KETOROLAC TROMETHAMINE 30 MG/ML IJ SOLN
30.0000 mg | Freq: Four times a day (QID) | INTRAMUSCULAR | Status: DC
  Administered 2015-07-02 – 2015-07-03 (×4): 30 mg via INTRAVENOUS
  Filled 2015-07-02 (×4): qty 1

## 2015-07-02 NOTE — Progress Notes (Signed)
Subjective: The patient complains of throbbing pain also had urinary retention last night she's on Percocet and morphine. She is getting ice over the splint.  She was up today and up twice yesterday but couldn't void  Social be kept again today to get her pain under control she is moaning complaining of throbbing we will add some Toradol  Her neurovascular exam is intact her compartments are soft    Objective: Vital signs in last 24 hours: Temp:  [98 F (36.7 C)-99 F (37.2 C)] 99 F (37.2 C) (03/30 0914) Pulse Rate:  [79-126] 110 (03/30 0914) Resp:  [14-20] 18 (03/30 0914) BP: (122-147)/(69-93) 135/93 mmHg (03/30 0914) SpO2:  [92 %-100 %] 99 % (03/30 0914)  Intake/Output from previous day: 03/29 0701 - 03/30 0700 In: 1600 [I.V.:1600] Out: 1510 [Urine:1500; Blood:10] Intake/Output this shift: Total I/O In: -  Out: 300 [Urine:300]   Recent Labs  06/29/15 1540  HGB 13.1    Recent Labs  06/29/15 1540  WBC 10.0  RBC 4.34  HCT 39.2  PLT 286    Recent Labs  06/29/15 1540  NA 139  K 4.1  CL 109  CO2 21*  BUN 15  CREATININE 0.68  GLUCOSE 94  CALCIUM 9.1   No results for input(s): LABPT, INR in the last 72 hours.  Assessment/Plan: Start Toradol, ice to the leg under the splint PT assessment. Fuller CanadaStanley Harrison 07/02/2015, 9:44 AM

## 2015-07-02 NOTE — Progress Notes (Signed)
Foley catheter removed 0913 this morning.  Patient up to bathroom and able to void at 1215.

## 2015-07-02 NOTE — Addendum Note (Signed)
Addendum  created 07/02/15 1605 by Franco Noneseresa S Mily Malecki, CRNA   Modules edited: Notes Section   Notes Section:  File: [id number]

## 2015-07-02 NOTE — Progress Notes (Signed)
Patient requesting that foley catheter be removed.  Foley placed last night for acute retention.  Dr. Romeo AppleHarrison paged.

## 2015-07-02 NOTE — Anesthesia Postprocedure Evaluation (Signed)
Anesthesia Post Note  Patient: Nicole AsaJessica L Rhodes  Procedure(s) Performed: Procedure(s) (LRB): OPEN REDUCTION INTERNAL FIXATION (ORIF) TIBIAL PLATEAU AND BONE GRAFT LEFT TIBIAL PLATEAU (Left)  Patient location during evaluation: Nursing Unit Anesthesia Type: General Level of consciousness: awake and alert Pain management: pain level controlled Vital Signs Assessment: post-procedure vital signs reviewed and stable Respiratory status: spontaneous breathing Cardiovascular status: blood pressure returned to baseline Anesthetic complications: no    Last Vitals:  Filed Vitals:   07/02/15 0914 07/02/15 1520  BP: 135/93 129/83  Pulse: 110 105  Temp: 37.2 C 36.6 C  Resp: 18 20    Last Pain:  Filed Vitals:   07/02/15 1520  PainSc: 5                  Bradely Rudin

## 2015-07-02 NOTE — Evaluation (Signed)
Physical Therapy Evaluation Patient Details Name: MAKALEIGH REINARD MRN: [id number] DOB: 10-05-1985 Today's Date: 07/02/2015   History of Present Illness  BEKKA QIAN has presented today for surgery, with the diagnosis of LEFT TIBIA PLATEAU FRACTURE The various methods of treatment have been discussed with the patient and family. After consideration of risks, benefits and other options for treatment, the patient has consented to Procedure(s) with comments  Clinical Impression  Pt is slightly unstable with rolling walker would not recommend crutches at this time.     Follow Up Recommendations Home health PT    Equipment Recommendations  Rolling walker with 5" wheels    Recommendations for Other Services   none    Precautions / Restrictions Precautions Precautions: Fall Restrictions Weight Bearing Restrictions: Yes LLE Weight Bearing: Non weight bearing      Mobility  Bed Mobility Overal bed mobility: Needs Assistance Bed Mobility: Supine to Sit;Sit to Supine     Supine to sit: Min assist Sit to supine: Min assist      Transfers Overall transfer level: Needs assistance Equipment used: Rolling walker (2 wheeled) Transfers: Sit to/from Stand Sit to Stand: Supervision            Ambulation/Gait Ambulation/Gait assistance: Min guard Ambulation Distance (Feet): 30 Feet Assistive device: Rolling walker (2 wheeled) Gait Pattern/deviations: Step-to pattern        Stairs                     Pertinent Vitals/Pain Pain Assessment: 0-10 Pain Score: 6  Pain Descriptors / Indicators: Aching Pain Intervention(s): Limited activity within patient's tolerance    Home Living Family/patient expects to be discharged to:: Private residence Living Arrangements: Spouse/significant other Available Help at Discharge: Family;Available 24 hours/day Type of Home: House Home Access: Stairs to enter   [employer] of Steps: 3-husband states that he  is going to build a ramp  Home Layout: One level   Additional Comments: husband has rented a wheelchair     Prior Function Level of Independence: Independent         Comments: worked as a Engineer, maintenance               Lower Extremity Assessment: LLE deficits/detail         Communication   Communication: No difficulties  Cognition Arousal/Alertness: Awake/alert Behavior During Therapy: WFL for tasks assessed/performed Overall Cognitive Status: Within Functional Limits for tasks assessed                               Assessment/Plan    PT Assessment Patient needs continued PT services  PT Diagnosis Difficulty walking;Acute pain   PT Problem List Decreased strength;Decreased activity tolerance;Decreased mobility;Pain  PT Treatment Interventions DME instruction;Gait training;Stair training;Therapeutic exercise   PT Goals (Current goals can be found in the Care Plan section) Acute Rehab PT Goals PT Goal Formulation: With patient Time For Goal Achievement: 07/04/15 Potential to Achieve Goals: Good    Frequency Min 5X/week   Barriers to discharge           End of Session Equipment Utilized During Treatment: Gait belt Activity Tolerance: Patient tolerated treatment well Patient left: in bed;with call bell/phone within reach;with family/visitor present           Time: 4098-1191 PT Time Calculation (min) (ACUTE ONLY): 20 min   Charges:   PT  Evaluation $PT Eval Low Complexity: 1 Procedure        Virgina OrganCynthia Russell, PT CLT [phone] 07/02/2015, 4:46 PM

## 2015-07-03 ENCOUNTER — Encounter (HOSPITAL_COMMUNITY): Payer: Self-pay | Admitting: Orthopedic Surgery

## 2015-07-03 DIAGNOSIS — R339 Retention of urine, unspecified: Secondary | ICD-10-CM | POA: Diagnosis not present

## 2015-07-03 MED ORDER — OXYCODONE-ACETAMINOPHEN 5-325 MG PO TABS: 1.0000 | ORAL_TABLET | ORAL | Status: DC | PRN

## 2015-07-03 MED ORDER — IBUPROFEN 800 MG PO TABS: 800.0000 mg | ORAL_TABLET | Freq: Three times a day (TID) | ORAL | Status: DC | PRN

## 2015-07-03 NOTE — Discharge Summary (Signed)
Physician Discharge Summary  Patient ID: [name] MRN: [id number] DOB/AGE: [birth year] 30 y.o.  Admit date: 07/01/2015 Discharge date: 07/03/2015  Admission Diagnoses: LEFT TIBIAL PLATEAU FRACTURE SPLIT DEPRESSION   Discharge Diagnoses: SAME   Active Problems:   Tibial plateau fracture   Tachycardia    Urinary retention  Discharged Condition: stable  Hospital Course: On Wednesday, March 29 the patient had internal fixation via open reduction and bone grafting with calcium phosphate bone substitute for split depressed tibial plateau fracture. We used a Synthes plate and screws and bone substitute with calcium phosphate. In the postop period she had tachycardia and we felt it is prudent to keep her in the hospital for cardiac monitoring telemetry and cardiac consultation. She was evaluated by cardiology and thought to be stable. She also had increased and uncontrolled pain and was admitted to get that under control. She was put on Percocet and morphine still had various difficulties controlling the pain until he put her on ketorolac.  She also had urinary retention treated with a Foley catheter which was subsequently removed and once she was just seen by physical therapy and ambulated her urinary retention was under control.  PT assessment  Bed Mobility Overal bed mobility: Needs Assistance Bed Mobility: Supine to Sit;Sit to Supine     Supine to sit: Min assist Sit to supine: Min assist   Transfers Overall transfer level: Needs assistance Equipment used: Rolling walker (2 wheeled) Transfers: Sit to/from Stand Sit to Stand: Supervision   Ambulation/Gait Ambulation/Gait assistance: Min guard Ambulation Distance (Feet): 30 Feet Assistive device: Rolling walker (2 wheeled) Gait Pattern/deviations: Step-to pattern  Discharge Exam: Blood pressure 131/71, pulse 96, temperature 98.6 F (37 C), temperature source Oral, resp. rate 20, last menstrual period 06/03/2015, SpO2 99  %.   Disposition: 01-Home or Self Care  Discharge Instructions    Diet - low sodium heart healthy    Complete by:  As directed      Discharge instructions    Complete by:  As directed   No weightbearing on the operative leg (left leg)  Apply ice packs directly over the brace. Apply ice packs every 30 minutes while you're awake  Move your toes and ankle up and down to prevent blood clots     Increase activity slowly    Complete by:  As directed             Medication List    STOP taking these medications        ibuprofen 600 MG tablet  Commonly known as:  ADVIL,MOTRIN      TAKE these medications        megestrol 40 MG tablet  Commonly known as:  MEGACE  Take 3 for 5 days then 2 x 5 days then 1 daily     NEXPLANON 68 MG Impl implant  Generic drug:  etonogestrel  Inject 1 each into the skin once.     oxyCODONE-acetaminophen 5-325 MG tablet  Commonly known as:  PERCOCET/ROXICET  Take 1 tablet by mouth every 4 (four) hours as needed.     promethazine 12.5 MG tablet  Commonly known as:  PHENERGAN  Take 1 tablet (12.5 mg total) by mouth every 6 (six) hours as needed for nausea or vomiting.      ASK your doctor about these medications        albuterol 108 (90 Base) MCG/ACT inhaler  Commonly known as:  PROVENTIL HFA;VENTOLIN HFA  Inhale 2 puffs into the lungs every  6 (six) hours as needed for wheezing or shortness of breath.     aspirin-acetaminophen-caffeine 250-250-65 MG tablet  Commonly known as:  EXCEDRIN MIGRAINE  Take 2 tablets by mouth every 6 (six) hours as needed for headache.           Follow-up Information    Follow up with Fuller CanadaStanley Harrison, MD.   Specialties:  Orthopedic Surgery, Radiology   Contact information:   [address] [place] [address] [phone]       Signed: Fuller CanadaStanley Harrison 07/03/2015, 8:03 AM

## 2015-07-03 NOTE — Progress Notes (Signed)
Patient ID: [name], female   DOB: [birth year], 30 y.o.   MRN: [id number]  POD2  BP 131/71 mmHg  Pulse 96  Temp(Src) 98.6 F (37 C) (Oral)  Resp 20  SpO2 99%  LMP 06/03/2015  PT   Bed Mobility Overal bed mobility: Needs Assistance Bed Mobility: Supine to Sit;Sit to Supine   Supine to sit: Min assist Sit to supine: Min assist   Transfers Overall transfer level: Needs assistance Equipment used: Rolling walker (2 wheeled) Transfers: Sit to/from Stand Sit to Stand: Supervision   Ambulation/Gait Ambulation/Gait assistance: Min guard Ambulation Distance (Feet): 30 Feet Assistive device: Rolling walker (2 wheeled) Gait Pattern/deviations: Step-to pattern

## 2015-07-03 NOTE — Care Management Note (Signed)
Case Management Note  Patient Details  Name: [name] MRN: [id number] Date of Birth: 01-Feb-1986  Subjective/Objective:     Spoke with patient and spouse who is from home . Patietn will be homebound while non weight bearing ad will need Home Health PT at discharge. Referral placed with [facility].  Patient spouse stated that the office is very close to their home. Spouse stated that they have a wheelchair and a have obtained a walker for the home. Only two steps to get into home and all one level.  No other CM needs identified.              Action/Plan: Home with Home Health.   Expected Discharge Date:  07/02/15               Expected Discharge Plan:  Home w Home Health Services  In-House Referral:     Discharge planning Services  CM Consult  Post Acute Care Choice:    Choice offered to:  Patient, Spouse  DME Arranged:  N/A DME Agency:  [facility]  HH Arranged:  PT HH Agency:     Status of Service:  Completed, signed off  Medicare Important Message Given:    Date Medicare IM Given:    Medicare IM give by:    Date Additional Medicare IM Given:    Additional Medicare Important Message give by:     If discussed at Long Length of Stay Meetings, dates discussed:    Additional Comments:  Adonis HugueninBerkhead, Nasia Cannan L, RN 07/03/2015, 10:37 AM

## 2015-07-03 NOTE — Discharge Instructions (Signed)
Tibial Plateau Fracture Treated With Open Reduction A tibial plateau fracture is a break in the bone that forms the bottom of your knee joint (tibia or shin bone). The lower end of your thigh bone (femur) forms the upper surface of your knee joint. The top of the tibia has a flat, smooth surface (tibial plateau). This part of your shin bone is made up of softer bone than the shaft of your shin bone. If a strong force shoves your femur down into your tibial plateau, the tibial plateau can collapse or break away at the edges. A displaced tibial plateau fracture means that one or more pieces of your tibial plateau have been moved out of normal position. This type of fracture is treated with open reduction. Open reduction is a type of surgery to repair broken bones that have been moved out of place (displaced fracture). The bone pieces are held in place with screws or other types of surgical hardware. This procedure helps bones to heal properly. It also helps to prevent severe arthritis from developing in the injured leg. LET [facility] PROVIDER KNOW ABOUT:  Any allergies you have.  All medicines you are taking, including vitamins, herbs, eye drops, creams, and over-the-counter medicines.  Previous problems you or members of your family have had with the use of anesthetics.  Any blood disorders you have.  Previous surgeries you have had.  Medical conditions you have. RISKS AND COMPLICATIONS Generally, this is a safe procedure. However, problems can occur and include:  Excessive bleeding.  Damage to blood vessels that supply the knee.  Infection. This can cause the screws or surgical hardware to loosen and be removed.  Improper healing. This can result in an unstable knee.  Knee stiffness.  Blood clot. This can form in the leg and travel to the lungs.  Knee pain.  Nerve damage. BEFORE THE PROCEDURE  Ask your health care provider about:  Changing or stopping your regular  medicines. This is especially important if you are taking diabetes medicines or blood thinners.  Taking medicines such as aspirin and ibuprofen. These medicines can thin your blood. Do not take these medicines before your procedure if your health care provider instructs you not to.  Follow your health care provider's restrictions on eating and drinking if you will be getting medicine that makes you go to sleep during your procedure (general anesthetic). PROCEDURE  An IV tube may be inserted into a vein.  You will be given one of the following:  A medicine that numbs the region of your body where the surgery will take place (regional anesthetic).  A medicine that makes you go to sleep (general anesthetic).  The skin over your kneewill be cleaned with a germ-killing (antiseptic) solution. Your hip area will also be cleaned if your fracture requires a bone graft that is taken from your hipbone.  The surgeon will make a cut (incision) through your skin to expose the areas of the fracture.  The broken bones will be returned to their normal positions. The surgeon will use screws and a metal plate or different types of wiring to hold the bones in place.  If a bone graft is used, a small incision might be made over your hip to remove a piece of bone and place it into your knee for support.  The surgeon will close all incisions with stitches (sutures) or staples.  A bandage (dressing) will be placed over your incisions. AFTER THE PROCEDURE  You will stay  and place it into your knee for support.   The surgeon will close all incisions with stitches (sutures) or staples.   A bandage (dressing) will be placed over your incisions.  AFTER THE PROCEDURE   You will stay in a recovery room. Your blood pressure, heart rate, breathing rate, and blood oxygen level will be monitored often until the medicines you were given have worn off.   It is normal to have some pain. You will be given medicine for pain relief.   You may have physical therapy while you are in the hospital.   You may need to wear a hinged knee brace. This lets your health care provider gently move your knee to prevent stiffness.     This  information is not intended to replace advice given to you by your health care provider. Make sure you discuss any questions you have with your health care provider.     Document Released: 01/03/2014 Document Reviewed: 01/03/2014  Elsevier Interactive Patient Education 2016 [employer]

## 2015-07-08 ENCOUNTER — Ambulatory Visit (INDEPENDENT_AMBULATORY_CARE_PROVIDER_SITE_OTHER): Payer: Self-pay | Admitting: Orthopedic Surgery

## 2015-07-08 VITALS — BP 141/91 | HR 99 | Ht 63.0 in | Wt 240.0 lb

## 2015-07-08 DIAGNOSIS — S82891A Other fracture of right lower leg, initial encounter for closed fracture: Secondary | ICD-10-CM

## 2015-07-08 MED ORDER — OXYCODONE-ACETAMINOPHEN 5-325 MG PO TABS: 1.0000 | ORAL_TABLET | ORAL | Status: DC | PRN

## 2015-07-08 MED ORDER — IBUPROFEN 800 MG PO TABS
800.0000 mg | ORAL_TABLET | Freq: Three times a day (TID) | ORAL | Status: DC | PRN
Start: 2015-07-08 — End: 2016-11-14

## 2015-07-08 NOTE — Progress Notes (Signed)
Chief Complaint  Patient presents with  . Routine Post Op    Post op # 1 left TIB PLAT ORIF DOS 07/01/15    POD 7 tib plateau ORIF + BG   NWB   The incision is clean the compartments are soft   Staples out   xrays next week   PT  3 x a week  aarom Non weight bearing  Quad strengthening

## 2015-07-09 ENCOUNTER — Telehealth: Payer: Self-pay | Admitting: Orthopedic Surgery

## 2015-07-09 NOTE — Telephone Encounter (Signed)
Verbal orders given from office visit note  PT  3 x a week  aarom Non weight bearing  Quad strengthening

## 2015-07-09 NOTE — Telephone Encounter (Signed)
[name] from [facility] called and wants to know about this patient's exercises.  Please call her at [phone]

## 2015-07-10 ENCOUNTER — Encounter (HOSPITAL_COMMUNITY): Payer: Self-pay | Admitting: Orthopedic Surgery

## 2015-07-11 ENCOUNTER — Emergency Department (HOSPITAL_COMMUNITY): Payer: BLUE CROSS/BLUE SHIELD

## 2015-07-11 ENCOUNTER — Emergency Department (HOSPITAL_COMMUNITY)
Admission: EM | Admit: 2015-07-11 | Discharge: 2015-07-11 | Disposition: A | Discharge status: Home / Self Care | Payer: BLUE CROSS/BLUE SHIELD | Attending: Emergency Medicine | Admitting: Emergency Medicine

## 2015-07-11 ENCOUNTER — Encounter (HOSPITAL_COMMUNITY): Payer: Self-pay | Admitting: Emergency Medicine

## 2015-07-11 DIAGNOSIS — I10 Essential (primary) hypertension: Secondary | ICD-10-CM | POA: Diagnosis not present

## 2015-07-11 DIAGNOSIS — F329 Major depressive disorder, single episode, unspecified: Secondary | ICD-10-CM | POA: Diagnosis not present

## 2015-07-11 DIAGNOSIS — Z7982 Long term (current) use of aspirin: Secondary | ICD-10-CM | POA: Insufficient documentation

## 2015-07-11 DIAGNOSIS — E669 Obesity, unspecified: Secondary | ICD-10-CM | POA: Insufficient documentation

## 2015-07-11 DIAGNOSIS — M79662 Pain in left lower leg: Secondary | ICD-10-CM | POA: Diagnosis not present

## 2015-07-11 DIAGNOSIS — G8918 Other acute postprocedural pain: Secondary | ICD-10-CM | POA: Diagnosis present

## 2015-07-11 DIAGNOSIS — Z79899 Other long term (current) drug therapy: Secondary | ICD-10-CM | POA: Insufficient documentation

## 2015-07-11 DIAGNOSIS — M7989 Other specified soft tissue disorders: Secondary | ICD-10-CM

## 2015-07-11 NOTE — ED Notes (Addendum)
Patient c/o "knot" to posterior aspect of left knee. Patient wearing knee immobilizer on. Per patient had surgery for fractured tibia. Per patient physical therapist concerned of possible blood clot. Per patient tender with touch.

## 2015-07-11 NOTE — ED Provider Notes (Signed)
History  By signing my name below, I, [name], attest that this documentation has been prepared under the direction and in the presence of Nicole Octave, MD. Electronically Signed: Karle Rhodes, ED Scribe. 07/11/2015. 11:25 AM.  Chief Complaint  Patient presents with  . Post-op Problem   The history is provided by the patient and medical records. No language interpreter was used.    HPI Comments:  [name] is a 30 y.o. obese female who presents to the Emergency Department complaining of a "knot" to the posterior left leg that she noticed last night. She had surgery 10 days ago by Dr. Romeo Rhodes to the leg due to a fractured tibia where plates and screws were placed. She states she was kept in the hospital for an extra day due to being tachycardic. She states the knot was present right after the surgery but she states it went away and has now returned. Pt reports falling yesterday without head trauma or LOC. Pt states she was seen by physical therapy earlier this morning and states they were concerned for bloot clot. Pt has not done anything to treat the symptoms. She denies modifying factors. She denies CP, SOB, numbness, tingling or weakness of the LLE. She is not currently on antibiotics. She has Implanon for birth control. She is currently taking Ibuprofen and Percocet for pain. She denies h/o DVT.  Past Medical History  Diagnosis Date  . Hypertension   . Migraines   . Preterm delivery   . Depression   . UTI (lower urinary tract infection)   . Obesity   . Seasonal rhinitis   . Fibrocystic breast changes   . Nexplanon insertion 11/29/2012    inserted nexplanon 11/29/12 in left arm, remove 11/30/15  . Abnormal uterine bleeding (AUB) 01/19/2015  . Nexplanon in place 01/19/2015  . Complication of anesthesia   . Family history of adverse reaction to anesthesia   . Hyperthyroidism    Past Surgical History  Procedure Laterality Date  . Cholecystectomy    .  Tonsillectomy    . Orif tibia plateau Left 07/01/2015    Procedure: OPEN REDUCTION INTERNAL FIXATION (ORIF) TIBIAL PLATEAU AND BONE GRAFT LEFT TIBIAL PLATEAU;  Surgeon: [name], MD;  Location: [facility];  Service: Orthopedics;  Laterality: Left;   Family History  Problem Relation Age of Onset  . Hypertension Mother   . Hypertension Father   . Hypertension Maternal Grandmother   . Hypertension Maternal Grandfather   . Diabetes Paternal Grandmother   . Hypertension Paternal Grandmother   . Heart disease Paternal Grandfather   . Diabetes Paternal Grandfather   . Hypertension Paternal Grandfather    Social History  Substance Use Topics  . Smoking status: Never Smoker   . Smokeless tobacco: Never Used  . Alcohol Use: No   OB History    Gravida Para Term Preterm AB TAB SAB Ectopic Multiple Living   Review of Systems A complete 10 system review of systems was obtained and all systems are negative except as noted in the HPI and PMH.   Allergies  Codeine; Dilaudid; Iodine; and Shellfish allergy  Home Medications   Prior to Admission medications   Medication Sig Start Date End Date Taking? Authorizing Provider  albuterol (PROVENTIL HFA;VENTOLIN HFA) 108 (90 Base) MCG/ACT inhaler Inhale 2 puffs into the lungs every 6 (six) hours as needed for wheezing or shortness of breath.   Yes  Historical Provider, MD  aspirin-acetaminophen-caffeine (EXCEDRIN MIGRAINE) [phone] MG tablet Take 2 tablets by mouth every 6 (six) hours as needed for headache.   Yes Historical Provider, MD  etonogestrel (NEXPLANON) 68 MG IMPL implant Inject 1 each into the skin once.   Yes Historical Provider, MD  ibuprofen (ADVIL,MOTRIN) 800 MG tablet Take 1 tablet (800 mg total) by mouth every 8 (eight) hours as needed. Patient taking differently: Take 800 mg by mouth every 8 (eight) hours as needed for mild pain or moderate pain.  07/08/15  Yes [name], MD  oxyCODONE-acetaminophen  (PERCOCET/ROXICET) 5-325 MG tablet Take 1 tablet by mouth every 4 (four) hours as needed (pain). 07/08/15  Yes [name], MD  promethazine (PHENERGAN) 12.5 MG tablet Take 1 tablet (12.5 mg total) by mouth every 6 (six) hours as needed for nausea or vomiting. 07/01/15  Yes [name], MD  megestrol (MEGACE) 40 MG tablet Take 3 for 5 days then 2 x 5 days then 1 daily Patient not taking: Reported on 07/11/2015 01/19/15   Nicole Potter, NP   Triage Vitals: BP 153/83 mmHg  Pulse 96  Temp(Src) 98 F (36.7 C) (Oral)  Resp 16  SpO2 100%  LMP 06/03/2015 Physical Exam  Constitutional: She is oriented to person, place, and time. She appears well-developed and well-nourished. No distress.  HENT:  Head: Normocephalic and atraumatic.  Mouth/Throat: Oropharynx is clear and moist. No oropharyngeal exudate.  Eyes: Conjunctivae and EOM are normal. Pupils are equal, round, and reactive to light.  Neck: Normal range of motion. Neck supple.  No meningismus.  Cardiovascular: Normal rate, regular rhythm, normal heart sounds and intact distal pulses.   No murmur heard. Pulmonary/Chest: Effort normal and breath sounds normal. No respiratory distress.  Abdominal: Soft. There is no tenderness. There is no rebound and no guarding.  Musculoskeletal: Normal range of motion. She exhibits no edema or tenderness.  Healing incision to medial lateral left leg. Posterior to left lateral knee there is approximately 10 cm induration extending from excision to posterior calf. Induration extends to posterior lateral left thigh. No fluctuance or erythema. Compartments soft. DP and PT pulses intact. Left ankle flexion and extension intact.  Neurological: She is alert and oriented to person, place, and time. No cranial nerve deficit. She exhibits normal muscle tone. Coordination normal.  No ataxia on finger to nose bilaterally. No pronator drift. 5/5 strength throughout. CN 2-12 intact.Equal grip strength.  Sensation intact.   Skin: Skin is warm.  Psychiatric: She has a normal mood and affect. Her behavior is normal.  Nursing note and vitals reviewed.   ED Course  Procedures (including critical care time) DIAGNOSTIC STUDIES: Oxygen Saturation is 100% on RA, normal by my interpretation.   COORDINATION OF CARE: 11:05 AM- Will order doppler study of LLE. Will X-Ray left tibia secondary to fall. Pt verbalizes understanding and agrees to plan.  Medications - No data to display  Labs Review Labs Reviewed - No data to display  Imaging Review US Venous Img Lower Unilateral Left  07/11/2015  CLINICAL DATA:  Left lower extremity swelling from the knee to the calf. This has been present since ORIF of a tibial fracture on 07/01/2015. EXAM: LEFT LOWER EXTREMITY VENOUS DOPPLER ULTRASOUND TECHNIQUE: Gray-scale sonography with graded compression, as well as color Doppler and duplex ultrasound were performed to evaluate the lower extremity deep venous systems from the level of the common femoral vein and including the common femoral, femoral, profunda femoral, popliteal and calf  veins including the posterior tibial, peroneal and gastrocnemius veins when visible. The superficial great saphenous vein was also interrogated. Spectral Doppler was utilized to evaluate flow at rest and with distal augmentation maneuvers in the common femoral, femoral and popliteal veins. COMPARISON:  None. FINDINGS: Contralateral Common Femoral Vein: Respiratory phasicity is normal and symmetric with the symptomatic side. No evidence of thrombus. Normal compressibility. Common Femoral Vein: No evidence of thrombus. Normal compressibility, respiratory phasicity and response to augmentation. Saphenofemoral Junction: No evidence of thrombus. Normal compressibility and flow on color Doppler imaging. Profunda Femoral Vein: No evidence of thrombus. Normal compressibility and flow on color Doppler imaging. Femoral Vein: No evidence of thrombus.  Normal compressibility, respiratory phasicity and response to augmentation. Popliteal Vein: No evidence of thrombus. Normal compressibility, respiratory phasicity and response to augmentation. Calf Veins: No evidence of thrombus. Normal compressibility and flow on color Doppler imaging. Superficial Great Saphenous Vein: No evidence of thrombus. Normal compressibility and flow on color Doppler imaging. Venous Reflux:  None. Other Findings: Patient feels the lump in the lateral popliteal fossa region. This is seen as a complex cyst measuring 6.2 x 1.3 x 4.5 cm. This may reflect a synovial) but is more likely a chronic hematoma/ seroma. IMPRESSION: 1. No evidence of a left lower extremity deep venous thrombosis. 2. Patient's reported palpable lump is a complex fluid collection measuring 6.2 cm in greatest dimension. It is likely a hematoma/seroma. Electronically Signed   By: Amie Portlandavid  Ormond M.D.   On: 07/11/2015 15:44   Dg Knee Complete 4 Views Left  07/11/2015  CLINICAL DATA:  LEFT POST OP LEG SWELLING, PATIENT STATES " SHE HAD SURGERY ON HER LEFT KNEE ON THE 29TH, YESTERDAY SHE NOTICED A KNOT COMING UP, PAINFUL TO THE TOUCH, NO HISTORY OF DVT. HISTORY OF HTN AND HYPERTHYROIDISM EXAM: LEFT KNEE - COMPLETE 4+ VIEW COMPARISON:  07/01/2015 FINDINGS: Changes of lateral tibial plateau ORIF, hardware intact without surrounding lucency. Fracture fragments in near anatomic alignment. Normal mineralization. No interval fracture or dislocation. No effusion evident. Skin staples noted laterally. IMPRESSION: 1. Changes of tibial plateau ORIF without apparent complication. Electronically Signed   By: Corlis Leak  Hassell M.D.   On: 07/11/2015 12:02   I have personally reviewed and evaluated these images and lab results as part of my medical decision-making.   EKG Interpretation None      MDM   Final diagnoses:  Pain and swelling of lower leg, left  LLE pain and swelling s/p R tibial plateau fracture repair.  Post op day 10.  No  CP or SOB.    Intact DP and PT pulses, compartments soft.  Doppler negative for DVT.  Does show fluid collection, likely seroma or hematoma.  Dw Dr. Romeo AppleHarrison.  No evidence of infection.  Continue elevation, ice, pain control. Follow up with Dr. Romeo AppleHarrison.   I personally performed the services described in this documentation, which was scribed in my presence. The recorded information has been reviewed and is accurate.    Nicole OctaveStephen Dakwan Pridgen, MD 07/11/15 1723

## 2015-07-11 NOTE — ED Notes (Signed)
Dr. Rancour at bedside updating patient and family.  

## 2015-07-11 NOTE — ED Notes (Signed)
EDP at bedside to evaluate pt 

## 2015-07-11 NOTE — ED Notes (Signed)
EDP at bedside updating patient and family. 

## 2015-07-11 NOTE — Discharge Instructions (Signed)
Edema There is no evidence of blood clot. Keep your leg elevated. Follow up with Dr. Romeo AppleHarrison. Return to the ED if you develop new or worsening symptoms. Edema is an abnormal buildup of fluids in your bodytissues. Edema is somewhatdependent on gravity to pull the fluid to the lowest place in your body. That makes the condition more common in the legs and thighs (lower extremities). Painless swelling of the feet and ankles is common and becomes more likely as you get older. It is also common in looser tissues, like around your eyes.  When the affected area is squeezed, the fluid may move out of that spot and leave a dent for a few moments. This dent is called pitting.  CAUSES  There are many possible causes of edema. Eating too much salt and being on your feet or sitting for a long time can cause edema in your legs and ankles. Hot weather may make edema worse. Common medical causes of edema include:  Heart failure.  Liver disease.  Kidney disease.  Weak blood vessels in your legs.  Cancer.  An injury.  Pregnancy.  Some medications.  Obesity. SYMPTOMS  Edema is usually painless.Your skin may look swollen or shiny.  DIAGNOSIS  Your health care provider may be able to diagnose edema by asking about your medical history and doing a physical exam. You may need to have tests such as X-rays, an electrocardiogram, or blood tests to check for medical conditions that may cause edema.  TREATMENT  Edema treatment depends on the cause. If you have heart, liver, or kidney disease, you need the treatment appropriate for these conditions. General treatment may include:  Elevation of the affected body part above the level of your heart.  Compression of the affected body part. Pressure from elastic bandages or support stockings squeezes the tissues and forces fluid back into the blood vessels. This keeps fluid from entering the tissues.  Restriction of fluid and salt intake.  Use of a water  pill (diuretic). These medications are appropriate only for some types of edema. They pull fluid out of your body and make you urinate more often. This gets rid of fluid and reduces swelling, but diuretics can have side effects. Only use diuretics as directed by your health care provider. HOME CARE INSTRUCTIONS   Keep the affected body part above the level of your heart when you are lying down.   Do not sit still or stand for prolonged periods.   Do not put anything directly under your knees when lying down.  Do not wear constricting clothing or garters on your upper legs.   Exercise your legs to work the fluid back into your blood vessels. This may help the swelling go down.   Wear elastic bandages or support stockings to reduce ankle swelling as directed by your health care provider.   Eat a low-salt diet to reduce fluid if your health care provider recommends it.   Only take medicines as directed by your health care provider. SEEK MEDICAL CARE IF:   Your edema is not responding to treatment.  You have heart, liver, or kidney disease and notice symptoms of edema.  You have edema in your legs that does not improve after elevating them.   You have sudden and unexplained weight gain. SEEK IMMEDIATE MEDICAL CARE IF:   You develop shortness of breath or chest pain.   You cannot breathe when you lie down.  You develop pain, redness, or warmth in the swollen areas.  You have heart, liver, or kidney disease and suddenly get edema.  You have a fever and your symptoms suddenly get worse. MAKE SURE YOU:   Understand these instructions.  Will watch your condition.  Will get help right away if you are not doing well or get worse.   This information is not intended to replace advice given to you by your health care provider. Make sure you discuss any questions you have with your health care provider.   Document Released: 03/21/2005 Document Revised: 04/11/2014 Document  Reviewed: 01/11/2013 Elsevier Interactive Patient Education [employer].

## 2015-07-11 NOTE — ED Notes (Signed)
Pt being transported to [facility] at this time. Delay explained to patient and family.

## 2015-07-11 NOTE — ED Notes (Signed)
DP and PT pulses faint with doppler. BPM 55.

## 2015-07-15 ENCOUNTER — Encounter: Payer: Self-pay | Admitting: Orthopaedic Surgery

## 2015-07-15 ENCOUNTER — Ambulatory Visit: Payer: BLUE CROSS/BLUE SHIELD

## 2015-07-15 ENCOUNTER — Ambulatory Visit: Payer: BLUE CROSS/BLUE SHIELD | Admitting: Orthopaedic Surgery

## 2015-07-15 VITALS — BP 144/95 | HR 90 | Temp 98.1°F | Resp 16 | Ht 63.0 in | Wt 240.0 lb

## 2015-07-15 DIAGNOSIS — S82122D Displaced fracture of lateral condyle of left tibia, subsequent encounter for closed fracture with routine healing: Secondary | ICD-10-CM

## 2015-07-15 NOTE — Progress Notes (Signed)
CC:  My knee is better.  She had ORIF of the left lateral tibial plateau.  Her wound looks good.  Sutures were removed.  Wound care was discussed.  NV is intact.  ROM is 0 to 105.  Her pain is decreased.    Encounter Diagnosis  Name Primary?  . Closed fracture of lateral portion of left tibial plateau, with routine healing, subsequent encounter Yes   She is to continue use of no weight bearing.  She will cut back home PT as she knows what to do with her exercises.  She was given a handicap sticker for her car for her husband to use.  Return in two weeks.  X-rays on return.  Call if any problem.

## 2015-07-23 ENCOUNTER — Encounter: Payer: Self-pay | Admitting: Orthopedic Surgery

## 2015-07-29 ENCOUNTER — Ambulatory Visit (INDEPENDENT_AMBULATORY_CARE_PROVIDER_SITE_OTHER): Payer: BLUE CROSS/BLUE SHIELD

## 2015-07-29 ENCOUNTER — Encounter: Payer: Self-pay | Admitting: Orthopedic Surgery

## 2015-07-29 ENCOUNTER — Ambulatory Visit: Payer: BLUE CROSS/BLUE SHIELD | Admitting: Orthopedic Surgery

## 2015-07-29 VITALS — BP 132/99 | HR 113 | Ht 63.0 in | Wt 240.0 lb

## 2015-07-29 DIAGNOSIS — S82142D Displaced bicondylar fracture of left tibia, subsequent encounter for closed fracture with routine healing: Secondary | ICD-10-CM | POA: Diagnosis not present

## 2015-07-29 DIAGNOSIS — S82122D Displaced fracture of lateral condyle of left tibia, subsequent encounter for closed fracture with routine healing: Secondary | ICD-10-CM

## 2015-07-29 NOTE — Patient Instructions (Signed)
With brace on and crutches you can put some weight on it Note for light duty only starting May 8th, 2017

## 2015-07-29 NOTE — Progress Notes (Signed)
  Chief Complaint  Patient presents with  . Follow-up    Closed fracture of lateral portion of left tibial plateau    Surgery was on March 29  Open treatment internal fixation lateral plateau fracture with bone substitute and lateral plate and screw construct  Patient doing well wound looks good knee flexion greater than 100 full extension  X-rays show maintenance of reduction no hardware consultations  Calf is soft and supple.  Patient placed in hinged knee brace she can weight-bear with the brace and the crutches  X-ray 4 weeks continue range of motion exercises

## 2015-08-05 ENCOUNTER — Telehealth: Payer: Self-pay | Admitting: Orthopedic Surgery

## 2015-08-05 NOTE — Telephone Encounter (Signed)
I can see her back here if a problem.  Schedule is light.  Not unusual to have some swelling.

## 2015-08-05 NOTE — Telephone Encounter (Signed)
Nicole BumpsJessica called to say that she is doing the lite weight bearing with her leg, but she is wondering if it should still be swelling Please call her at [phone]

## 2015-08-26 ENCOUNTER — Ambulatory Visit: Payer: BLUE CROSS/BLUE SHIELD | Admitting: Orthopedic Surgery

## 2015-08-26 ENCOUNTER — Ambulatory Visit (INDEPENDENT_AMBULATORY_CARE_PROVIDER_SITE_OTHER): Payer: BLUE CROSS/BLUE SHIELD

## 2015-08-26 ENCOUNTER — Encounter: Payer: Self-pay | Admitting: Orthopedic Surgery

## 2015-08-26 VITALS — BP 139/80 | HR 95 | Ht 63.0 in | Wt 240.0 lb

## 2015-08-26 DIAGNOSIS — S82122D Displaced fracture of lateral condyle of left tibia, subsequent encounter for closed fracture with routine healing: Secondary | ICD-10-CM

## 2015-08-26 DIAGNOSIS — S82142D Displaced bicondylar fracture of left tibia, subsequent encounter for closed fracture with routine healing: Secondary | ICD-10-CM

## 2015-08-26 NOTE — Patient Instructions (Addendum)
CONTINUE WBAT IN BRACE   LIGHT DUTY NOTE FOR 4 MORE WEEKS

## 2015-08-26 NOTE — Progress Notes (Signed)
Chief Complaint  Patient presents with  . Follow-up    left Tibal Plaateau DOS 07/01/15    BP 139/80 mmHg  Pulse 95  Ht 5\' 3"  (1.6 m)  Wt 240 lb (108.863 kg)  BMI 42.52 kg/m2  Walking well with a walker weight bearing as tolerated. Knee flexion 125 full extension  The wound is clean without infection  X-rays show maintenance of reduction hardware is intact no complications  The patient will continue weightbearing as tolerated with a walker  Follow-up 4 weeks repeat x-ray  Continue light duty for 4 weeks

## 2015-09-02 ENCOUNTER — Telehealth: Payer: Self-pay | Admitting: Orthopedic Surgery

## 2015-09-02 NOTE — Telephone Encounter (Signed)
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  Please advise.

## 2015-09-03 NOTE — Telephone Encounter (Signed)
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  She works at a [employer]iological [employer].  Please call her today if poss.

## 2015-09-03 NOTE — Telephone Encounter (Signed)
Routing to Dr Harrison for approval 

## 2015-09-04 ENCOUNTER — Encounter: Payer: Self-pay | Admitting: Orthopedic Surgery

## 2015-09-04 NOTE — Telephone Encounter (Signed)
She can advance her work duties as long as she has to brace on

## 2015-09-04 NOTE — Telephone Encounter (Signed)
Called back to patient and relayed.  Work note provided accordingly.

## 2015-09-23 ENCOUNTER — Ambulatory Visit: Payer: BLUE CROSS/BLUE SHIELD | Admitting: Orthopedic Surgery

## 2015-09-23 ENCOUNTER — Ambulatory Visit: Payer: BLUE CROSS/BLUE SHIELD

## 2015-09-23 ENCOUNTER — Encounter: Payer: Self-pay | Admitting: Orthopedic Surgery

## 2015-09-23 ENCOUNTER — Ambulatory Visit (INDEPENDENT_AMBULATORY_CARE_PROVIDER_SITE_OTHER): Payer: BLUE CROSS/BLUE SHIELD

## 2015-09-23 VITALS — BP 142/88 | HR 85 | Ht 63.0 in | Wt 253.6 lb

## 2015-09-23 DIAGNOSIS — S82142D Displaced bicondylar fracture of left tibia, subsequent encounter for closed fracture with routine healing: Secondary | ICD-10-CM

## 2015-09-23 DIAGNOSIS — S82122D Displaced fracture of lateral condyle of left tibia, subsequent encounter for closed fracture with routine healing: Secondary | ICD-10-CM

## 2015-09-23 NOTE — Progress Notes (Signed)
Chief Complaint  Patient presents with  . Routine Post Op   07/01/15 DOS TIB PLATEAU OTIF   XRAYS  LATERAL PLATE AND SCREWS  FRACTURE REDUCED   KNEE IS STABLE WOUND IS NORMAL   0-120 ROM   D/C D

## 2015-09-23 NOTE — Patient Instructions (Signed)
REGULAR DUTY

## 2016-11-01 ENCOUNTER — Telehealth: Payer: Self-pay | Admitting: Adult Health

## 2016-11-01 NOTE — Telephone Encounter (Signed)
Informed patient that she needs appointment since she has not been seen in a while. Verbalized understanding.

## 2016-11-01 NOTE — Telephone Encounter (Signed)
Patient called with complaints of vaginal bleeding x3 weeks now with Nexplanon in place since 2016. She states she will occasionally have some spotting but normally the bleeding does not last this long. She was on Megace previously which did help. Please advise.

## 2016-11-01 NOTE — Telephone Encounter (Signed)
Patient called state she has the nexplanon and she knows she could have break thru bleeding but patient states that she has been bleeding for three days very heavy. Please contact pt

## 2016-11-03 ENCOUNTER — Ambulatory Visit: Payer: BLUE CROSS/BLUE SHIELD | Admitting: Women's Health

## 2016-11-14 ENCOUNTER — Encounter: Payer: Self-pay | Admitting: Adult Health

## 2016-11-14 ENCOUNTER — Ambulatory Visit (INDEPENDENT_AMBULATORY_CARE_PROVIDER_SITE_OTHER): Payer: Commercial Managed Care - PPO | Admitting: Adult Health

## 2016-11-14 VITALS — BP 146/80 | HR 102 | Ht 63.0 in | Wt 248.5 lb

## 2016-11-14 DIAGNOSIS — Z30017 Encounter for initial prescription of implantable subdermal contraceptive: Secondary | ICD-10-CM

## 2016-11-14 DIAGNOSIS — Z975 Presence of (intrauterine) contraceptive device: Secondary | ICD-10-CM

## 2016-11-14 DIAGNOSIS — N939 Abnormal uterine and vaginal bleeding, unspecified: Secondary | ICD-10-CM

## 2016-11-14 DIAGNOSIS — Z3202 Encounter for pregnancy test, result negative: Secondary | ICD-10-CM | POA: Diagnosis not present

## 2016-11-14 DIAGNOSIS — R5383 Other fatigue: Secondary | ICD-10-CM | POA: Diagnosis not present

## 2016-11-14 LAB — POCT URINE PREGNANCY: PREG TEST UR: NEGATIVE

## 2016-11-14 MED ORDER — MEGESTROL ACETATE 40 MG PO TABS: ORAL_TABLET | ORAL | 1 refills | Status: DC

## 2016-11-14 NOTE — Patient Instructions (Signed)
Take megace Follow up prn 

## 2016-11-14 NOTE — Progress Notes (Signed)
Subjective:     Patient ID: [name], female   DOB: 04/10/1985, 31 y.o.   MRN: [id number]  HPI [name] is a 31 year old white female, married in complaining for 5 weeks now and clots and feeling tired. Has nexplanon was [place] last year in [place].   Review of Systems Bleeding for 5 weeks with nexplanon +Clots Feeling tired Reviewed past medical,surgical, social and family history. Reviewed medications and allergies.     Objective:   Physical Exam BP (!) 146/80 (BP Location: Left Arm, Patient Position: Sitting, Cuff Size: Large)   Pulse (!) 102   Ht 5\' 3"  (1.6 m)   Wt 248 lb 8 oz (112.7 kg)   BMI 44.02 kg/m UPT negative. Skin warm and dry.Pelvic: external genitalia is normal in appearance no lesions, vagina:period like blood,urethra has no lesions or masses noted, cervix:smooth and bulbous, uterus: normal size, shape and contour, non tender, no masses felt, adnexa: no masses or tenderness noted. Bladder is non tender and no masses felt. Nexplanon palpated left arm.  GC/CHL obtained.    Discussed will check labs to rule out anemia and thyroid problem and will try megace to see if it will stop the bleeding and she agrees.  Assessment:     1. Abnormal uterine bleeding (AUB)   2. Nexplanon in place   3. Pregnancy examination or test, negative result   4. Nexplanon insertion   5. Feeling tired       Plan:     Check CBC,CMP,TSH  GC/CHL sent Rx megace 40 mg #45 3 x 5 days then 2 x 5 days then 1 daily with 1 refill    Follow up prn

## 2016-11-16 LAB — GC/CHLAMYDIA PROBE AMP
CHLAMYDIA, DNA PROBE: NEGATIVE
NEISSERIA GONORRHOEAE BY PCR: NEGATIVE

## 2016-11-17 ENCOUNTER — Telehealth: Payer: Self-pay | Admitting: Obstetrics & Gynecology

## 2016-11-17 NOTE — Telephone Encounter (Signed)
Spoke with pt. Pt started Megace Monday and is still bleeding heavy. I advised to give it more time. If she hasn't noticed a difference by Monday or Tuesday, call office. Pt voiced understanding. JSY

## 2016-11-17 NOTE — Telephone Encounter (Signed)
Pt called stating that she would like a call back from the nurse, Patient did not state the reason why. Please contact pt

## 2016-11-23 ENCOUNTER — Telehealth: Payer: Self-pay | Admitting: *Deleted

## 2016-11-23 NOTE — Telephone Encounter (Signed)
Spoke with pt. Pt is on Megace 40 mg, takes 2 daily. She is concerned because she is still bleeding. I advised to continue taking Megace and hopefully bleeding will stop. Bleeding has eased up some. If she gets to end of pills and she's still bleeding, call us. Reassured pt that since it's eased up, that's a good sign. Pt voiced understanding. JSY

## 2016-12-22 ENCOUNTER — Telehealth: Payer: Self-pay | Admitting: Adult Health

## 2016-12-22 NOTE — Telephone Encounter (Signed)
Can resume megace to stop bleeding, and check with [facility] office to date that neplanon inserted.

## 2016-12-22 NOTE — Telephone Encounter (Signed)
Spoke with pt. Pt finished Megace. She had 2 weeks with no bleeding. Bleeding started back 2 days ago. Passing clots. Pt states if this is what she's gonna have to deal with, she wants Nexplanon removed and start something else. Please advise. Thanks!! JSY

## 2018-06-18 ENCOUNTER — Telehealth: Payer: Self-pay | Admitting: Adult Health

## 2018-06-18 ENCOUNTER — Telehealth: Payer: Self-pay | Admitting: *Deleted

## 2018-06-18 NOTE — Telephone Encounter (Signed)
Please call would like to know if we have records of her last Hep B injection

## 2018-06-18 NOTE — Telephone Encounter (Signed)
Pt called asking if we could tell her when she received her hepatitis B vaccine.  Advised that we do not give that vaccine here and it is not documented in her chart. Pt verbalized understanding.

## 2018-07-17 ENCOUNTER — Telehealth: Payer: Self-pay | Admitting: Adult Health

## 2018-07-17 DIAGNOSIS — N6452 Nipple discharge: Secondary | ICD-10-CM

## 2018-07-17 NOTE — Telephone Encounter (Signed)
Patient called, stated that she has a discharge out of her left breast x one week, sometimes a little painful, sometimes the discharge is clear and sometimes a little cloudy.  Patient stated that she is not pregnant.  [facility]  [phone]

## 2018-07-17 NOTE — Addendum Note (Signed)
Addended by: Cyril Mourning A on: 07/17/2018 01:13 PM   Modules accepted: Orders

## 2018-07-17 NOTE — Telephone Encounter (Signed)
Pt complains of nipple discharge, can be spontaneous and hurts at times, will check TSH and Prolactin level 4/16 before appt at 11;30

## 2018-07-18 ENCOUNTER — Telehealth: Payer: Self-pay | Admitting: *Deleted

## 2018-07-18 NOTE — Telephone Encounter (Signed)
mychart message with covid restrictions.

## 2018-07-19 ENCOUNTER — Encounter: Payer: Self-pay | Admitting: Adult Health

## 2018-07-19 ENCOUNTER — Other Ambulatory Visit: Payer: Self-pay

## 2018-07-19 ENCOUNTER — Ambulatory Visit (INDEPENDENT_AMBULATORY_CARE_PROVIDER_SITE_OTHER): Payer: PRIVATE HEALTH INSURANCE | Admitting: Adult Health

## 2018-07-19 ENCOUNTER — Other Ambulatory Visit (HOSPITAL_COMMUNITY)
Admission: RE | Admit: 2018-07-19 | Discharge: 2018-07-19 | Disposition: A | Discharge status: Home / Self Care | Payer: PRIVATE HEALTH INSURANCE | Source: Ambulatory Visit | Attending: Adult Health | Admitting: Adult Health

## 2018-07-19 VITALS — BP 145/77 | HR 97 | Ht 63.5 in | Wt 250.0 lb

## 2018-07-19 DIAGNOSIS — N6452 Nipple discharge: Secondary | ICD-10-CM

## 2018-07-19 NOTE — Progress Notes (Signed)
Patient ID: KYARA BARTMAN, female   DOB: Mar 29, 1986, 33 y.o.   MRN: [id number] History of Present Illness: Heathe is a 33 year old white female, married, G0P0, in complaining of clear to cloudy nipple discharge on the lft. PCP is Roma Kayser, [place].   Current Medications, Allergies, Past Medical History, Past Surgical History, Family History and Social History were reviewed in [employer] record.     Review of Systems: +nipple discharge on the left    Physical Exam:BP (!) 145/77 (BP Location: Left Arm, Patient Position: Sitting, Cuff Size: Normal)   Pulse 97   Ht 5' 3.5" (1.613 m)   Wt 250 lb (113.4 kg)   BMI 43.59 kg/m  General:  Well developed, well nourished, no acute distress Skin:  Warm and dry Neck:  Midline trachea, normal thyroid, good ROM, no lymphadenopathy Lungs; Clear to auscultation bilaterally Breast:  No dominant palpable mass, retraction, or nipple discharge on the right, on the left, no retraction but has cloudy nipple discharge on the left,she has bilateral irregularities and some old scarring on both breasts from prior boils. A slide was obtained of nipple discharge and will be sent to pathology.  Psych:  No mood changes, alert and cooperative,seems happy She had TSH and Prolactin drawn before visit, and she gave verbal consent for exam  without chaperone.    Impression: 1. Discharge from left nipple TSH and Prolactin checked prior to exam -will send slide of discharge to pathology -leave breasts alone Will talk when results back F/U prn     Plan:

## 2018-07-20 ENCOUNTER — Telehealth: Payer: Self-pay | Admitting: Adult Health

## 2018-07-20 LAB — PROLACTIN: Prolactin: 10.2 ng/mL (ref 4.8–23.3)

## 2018-07-20 LAB — TSH: TSH: 1.63 u[IU]/mL (ref 0.450–4.500)

## 2018-07-20 NOTE — Telephone Encounter (Signed)
Patient called, stated that she had labs done yesterday and wanted to see if the results are in.  [phone]

## 2018-07-20 NOTE — Telephone Encounter (Signed)
Pt aware that TSH and PRL both normal

## 2018-08-07 ENCOUNTER — Telehealth: Payer: Self-pay | Admitting: Adult Health

## 2018-08-07 DIAGNOSIS — N6452 Nipple discharge: Secondary | ICD-10-CM

## 2018-08-07 NOTE — Telephone Encounter (Signed)
Called patient back. She states that she is still having the same problems as before. Labs all normal per [place]. Patient wants to know what to do now? Advised Victorino Dike gone for the day but I will send message to her.

## 2018-08-07 NOTE — Telephone Encounter (Signed)
Patient called, looking for lab results.  [phone]

## 2018-08-08 NOTE — Telephone Encounter (Signed)
Still has nipple discharge and has itching, will get diagnostic mammogram and [place], has bilateral discharge but more in left  appt 4/12 at 8:40 am at [facility]

## 2018-08-14 ENCOUNTER — Encounter (HOSPITAL_COMMUNITY): Payer: PRIVATE HEALTH INSURANCE

## 2018-08-14 ENCOUNTER — Ambulatory Visit (HOSPITAL_COMMUNITY): Admission: RE | Admit: 2018-08-14 | Payer: PRIVATE HEALTH INSURANCE | Source: Ambulatory Visit

## 2018-08-14 ENCOUNTER — Ambulatory Visit (HOSPITAL_COMMUNITY): Payer: PRIVATE HEALTH INSURANCE | Attending: Adult Health

## 2018-11-21 ENCOUNTER — Telehealth: Payer: Self-pay | Admitting: Obstetrics & Gynecology

## 2018-11-21 NOTE — Telephone Encounter (Signed)
Pt reports that she is having brown spotting for two weeks. Her nexplanon is due to be removed in October. She has only had about 3 periods during the entire time since she got the nexplanon. Patient states that she wants to get a BTL. Advised that we can reschedule the pap physical with a surgeon so that she can get that scheduled. Patient agreeable. Advised I would still send message to a provider to see if there is anything she should do for the bleeding.

## 2018-11-21 NOTE — Telephone Encounter (Signed)
Patient called, would not really tell me what was going on, but she would like to speak to a nurse regarding a bleeding issue.  [phone]

## 2018-12-04 ENCOUNTER — Other Ambulatory Visit: Payer: PRIVATE HEALTH INSURANCE | Admitting: Obstetrics and Gynecology

## 2018-12-28 ENCOUNTER — Encounter: Payer: PRIVATE HEALTH INSURANCE | Admitting: Internal Medicine

## 2019-01-01 ENCOUNTER — Encounter: Payer: Self-pay | Admitting: Family Medicine

## 2019-01-01 ENCOUNTER — Ambulatory Visit (INDEPENDENT_AMBULATORY_CARE_PROVIDER_SITE_OTHER): Payer: PRIVATE HEALTH INSURANCE | Admitting: Family Medicine

## 2019-01-01 ENCOUNTER — Other Ambulatory Visit: Payer: Self-pay

## 2019-01-01 VITALS — BP 122/85 | HR 117 | Temp 98.4°F | Ht 63.0 in | Wt 274.4 lb

## 2019-01-01 DIAGNOSIS — R000 Tachycardia, unspecified: Secondary | ICD-10-CM | POA: Diagnosis not present

## 2019-01-01 DIAGNOSIS — E049 Nontoxic goiter, unspecified: Secondary | ICD-10-CM | POA: Diagnosis not present

## 2019-01-01 NOTE — Progress Notes (Signed)
New Patient Office Visit  Subjective:  Patient ID: [name], female    DOB: 07-Jun-1985  Age: 33 y.o. MRN: [id number]  CC:  Chief Complaint  Patient presents with  . New Patient (Initial Visit)  . Tachycardia    intermittanly    HPI [name] presents for tachycardia. Pt notes elevated HR and chest pain related to elevated HR. Pt was told she had a heart murmur.  No prior cardio testing   Pt states caffeine makes symptoms worse.  Pt states if she drinks energy drinks or other concentrated caffeine.  Pt with tempeture related urticaria albuterol used ony for these episodes. Pt takes zyrtec daily.    Past Medical History:  Diagnosis Date  . Abnormal uterine bleeding (AUB) 01/19/2015  . Complication of anesthesia   . Depression   . Family history of adverse reaction to anesthesia   . Fibrocystic breast changes   . Hypertension   . Hyperthyroidism   . Migraines   . Nexplanon in place 01/19/2015  . Nexplanon insertion 11/29/2012   inserted nexplanon 11/29/12 in left arm, remove 11/30/15  . Obesity   . Preterm delivery   . Seasonal rhinitis   . UTI (lower urinary tract infection)     Past Surgical History:  Procedure Laterality Date  . CHOLECYSTECTOMY    . ORIF TIBIA PLATEAU Left 07/01/2015   Procedure: OPEN REDUCTION INTERNAL FIXATION (ORIF) TIBIAL PLATEAU AND BONE GRAFT LEFT TIBIAL PLATEAU;  Surgeon: Carole Civil, MD;  Location: [facility];  Service: Orthopedics;  Laterality: Left;  . TONSILLECTOMY      Family History  Problem Relation Age of Onset  . Hypertension Mother   . Hypertension Father   . Hypertension Maternal Grandmother   . Hypertension Maternal Grandfather   . Diabetes Paternal Grandmother   . Hypertension Paternal Grandmother   . Heart disease Paternal Grandfather   . Diabetes Paternal Grandfather   . 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 Marital status: Married    Spouse name: Not on file  . Number of children: Not on file  . Years of education: Not on file  . Highest education level: Not on file  Occupational History  . Not on file  Social Needs  . Financial resource strain: Not on file  . Food insecurity    Worry: Not on file    Inability: Not on file  . Transportation needs    Medical: Not on file    Non-medical: Not on file  Tobacco Use  . Smoking status: Never Smoker  . Smokeless tobacco: Never Used  Substance and Sexual Activity  . Alcohol use: No  . Drug use: No  . Sexual activity: Yes    Birth control/protection: Implant  Lifestyle  . Physical activity    Days per week: Not on file    Minutes per session: Not on file  . Stress: Not on file  Relationships  . Social Herbalist on phone: Not on file    Gets together: Not on file    Attends religious service: Not on file    Active member of club or organization: Not on file    Attends meetings of clubs or organizations: Not on file    Relationship status: Not on file  . Intimate partner violence    Fear of current or ex partner:  Not on file    Emotionally abused: Not on file    Physically abused: Not on file    Forced sexual activity: Not on file  Other Topics Concern  . Not on file  Social History Narrative  . Not on file    ROS Review of Systems  Constitutional: Negative.   HENT: Negative.   Eyes:       Glasses-distance  Respiratory: Negative.   Cardiovascular: Positive for palpitations.  Gastrointestinal: Negative.   Endocrine: Negative.   Genitourinary: Negative.   Musculoskeletal:       Back pain-muscular   Skin:       eczema  Allergic/Immunologic:       Temperature change  Neurological: Negative.   Hematological: Negative.   Psychiatric/Behavioral: Negative.     Objective:   Today's Vitals: BP 122/85 (BP Location: Left Arm, Patient Position: Sitting, Cuff Size: Normal)   Pulse  (!) 117   Temp 98.4 F (36.9 C) (Oral)   Ht 5\' 3"  (1.6 m)   Wt 274 lb 6.4 oz (124.5 kg)   SpO2 99%   BMI 48.61 kg/m   Physical Exam Vitals signs reviewed.  Constitutional:      Appearance: She is obese.  HENT:     Head: Normocephalic and atraumatic.     Right Ear: Tympanic membrane and ear canal normal.     Left Ear: Tympanic membrane and ear canal normal.     Nose: Nose normal.     Mouth/Throat:     Pharynx: No oropharyngeal exudate.  Eyes:     Conjunctiva/sclera: Conjunctivae normal.  Neck:     Musculoskeletal: Normal range of motion and neck supple.     Comments: Enlarged thyroid Cardiovascular:     Rate and Rhythm: Regular rhythm. Tachycardia present.     Pulses: Normal pulses.     Heart sounds: Normal heart sounds.  Pulmonary:     Effort: Pulmonary effort is normal.     Breath sounds: Normal breath sounds.  Musculoskeletal: Normal range of motion.  Skin:    Findings: No erythema or rash.  Neurological:     Mental Status: She is alert and oriented to person, place, and time.  Psychiatric:        Mood and Affect: Mood normal.     Assessment & Plan:    Outpatient Encounter Medications as of 01/01/2019  Medication Sig  . albuterol (PROVENTIL HFA;VENTOLIN HFA) 108 (90 Base) MCG/ACT inhaler Inhale 2 puffs into the lungs every 6 (six) hours as needed for wheezing or shortness of breath.  . etonogestrel (NEXPLANON) 68 MG IMPL implant Inject 1 each into the skin once.  . [DISCONTINUED] megestrol (MEGACE) 40 MG tablet Take 3 tabs for 5 days then 2 x 5 days then 1 daily (Patient not taking: Reported on 07/19/2018)   No facility-administered encounter medications on file as of 01/01/2019.    1. Tachycardia - CBC with Differential - COMPLETE METABOLIC PANEL WITH GFR - EKG 01/03/2019 with pt - Ambulatory referral to Cardiology labwork 2. Enlarged thyroid gland TSH - [id number] Soft Tissue Head/Neck; Future Follow-up:   LISA [place], MD

## 2019-01-01 NOTE — Patient Instructions (Addendum)
Trial of xyzal if temperature concerns anticipated Cardiology referral Ultrasound of the thyroid labwork-[facility]

## 2019-01-02 ENCOUNTER — Encounter: Payer: PRIVATE HEALTH INSURANCE | Admitting: Obstetrics and Gynecology

## 2019-01-02 NOTE — Progress Notes (Deleted)
Cardiology Office Note   Date:  01/02/2019   ID:  [name], DOB [birth year], MRN [id number]  PCP:  Rosalee Kaufman, PA-C  Cardiologist:   No primary care provider on file. Referring:  ***  No chief complaint on file.     History of Present Illness: [name] is a 33 y.o. female who was referred by *** for evaluation of tachycardia.  She saw Dr. Bronson Ing in March of last year.   She was status post repair of a tibial fracture and she had sinus tach related to pain.    ***  Past Medical History:  Diagnosis Date  . Abnormal uterine bleeding (AUB) 01/19/2015  . Complication of anesthesia   . Depression   . Family history of adverse reaction to anesthesia   . Fibrocystic breast changes   . Hypertension   . Hyperthyroidism   . Migraines   . Nexplanon in place 01/19/2015  . Nexplanon insertion 11/29/2012   inserted nexplanon 11/29/12 in left arm, remove 11/30/15  . Obesity   . Preterm delivery   . Seasonal rhinitis   . UTI (lower urinary tract infection)     Past Surgical History:  Procedure Laterality Date  . CHOLECYSTECTOMY    . ORIF TIBIA PLATEAU Left 07/01/2015   Procedure: OPEN REDUCTION INTERNAL FIXATION (ORIF) TIBIAL PLATEAU AND BONE GRAFT LEFT TIBIAL PLATEAU;  Surgeon: Carole Civil, MD;  Location: [facility];  Service: Orthopedics;  Laterality: Left;  . TONSILLECTOMY       Current Outpatient Medications  Medication Sig Dispense Refill  . albuterol (PROVENTIL HFA;VENTOLIN HFA) 108 (90 Base) MCG/ACT inhaler Inhale 2 puffs into the lungs every 6 (six) hours as needed for wheezing or shortness of breath.    . etonogestrel (NEXPLANON) 68 MG IMPL implant Inject 1 each into the skin once.     No current facility-administered medications for this visit.     Allergies:   Codeine, Dilaudid [hydromorphone hcl], Iodine, and Shellfish allergy    Social History:  The patient  reports that she has never smoked. She has never used smokeless tobacco.  She reports that she does not drink alcohol or use drugs.   Family History:  The patient's ***family history includes Diabetes in her paternal grandfather and paternal grandmother; Heart disease in her paternal grandfather; Hypertension in her father, maternal grandfather, maternal grandmother, mother, paternal grandfather, and paternal grandmother.    ROS:  Please see the history of present illness.   Otherwise, review of systems are positive for {NONE DEFAULTED:18576::"none"}.   All other systems are reviewed and negative.    PHYSICAL EXAM: VS:  There were no vitals taken for this visit. , BMI There is no height or weight on file to calculate BMI. GENERAL:  Well appearing HEENT:  Pupils equal round and reactive, fundi not visualized, oral mucosa unremarkable NECK:  No jugular venous distention, waveform within normal limits, carotid upstroke brisk and symmetric, no bruits, no thyromegaly LYMPHATICS:  No cervical, inguinal adenopathy LUNGS:  Clear to auscultation bilaterally BACK:  No CVA tenderness CHEST:  Unremarkable HEART:  PMI not displaced or sustained,S1 and S2 within normal limits, no S3, no S4, no clicks, no rubs, *** murmurs ABD:  Flat, positive bowel sounds normal in frequency in pitch, no bruits, no rebound, no guarding, no midline pulsatile mass, no hepatomegaly, no splenomegaly EXT:  2 plus pulses throughout, no edema, no cyanosis no clubbing SKIN:  No rashes no nodules NEURO:  Cranial nerves II  through XII grossly intact, motor grossly intact throughout [facility]:  Cognitively intact, oriented to person place and time    EKG:  EKG {ACTION; IS/IS KAJ:[id number]} ordered today. The ekg ordered today demonstrates ***   Recent Labs: 07/19/2018: TSH 1.630    Lipid Panel    Component Value Date/Time   CHOL 130 05/09/2007 1944   TRIG 64 05/09/2007 1944   HDL 43 05/09/2007 1944   CHOLHDL 3.0 Ratio 05/09/2007 1944   VLDL 13 05/09/2007 1944   LDLCALC 74 05/09/2007 1944       Wt Readings from Last 3 Encounters:  01/01/19 274 lb 6.4 oz (124.5 kg)  07/19/18 250 lb (113.4 kg)  11/14/16 248 lb 8 oz (112.7 kg)      Other studies Reviewed: Additional studies/ records that were reviewed today include: ***. Review of the above records demonstrates:  Please see elsewhere in the note.  ***   ASSESSMENT AND PLAN:  TACHYCARDIA:  ***   Current medicines are reviewed at length with the patient today.  The patient {ACTIONS; HAS/DOES NOT HAVE:19233} concerns regarding medicines.  The following changes have been made:  {PLAN; NO CHANGE:13088:s}  Labs/ tests ordered today include: *** No orders of the defined types were placed in this encounter.    Disposition:   FU with ***    Signed, Rollene Rotunda, MD  01/02/2019 8:50 PM    [facility]

## 2019-01-03 ENCOUNTER — Ambulatory Visit: Payer: PRIVATE HEALTH INSURANCE | Admitting: Cardiology

## 2019-01-03 ENCOUNTER — Telehealth: Payer: Self-pay | Admitting: Family Medicine

## 2019-01-03 NOTE — Telephone Encounter (Signed)
Patient is calling and states she needs her cardiology referral placed to the [place] office.

## 2019-01-03 NOTE — Telephone Encounter (Signed)
Referral has been changed and [name] is aware

## 2019-01-04 ENCOUNTER — Other Ambulatory Visit: Payer: PRIVATE HEALTH INSURANCE | Admitting: Women's Health

## 2019-01-04 DIAGNOSIS — E049 Nontoxic goiter, unspecified: Secondary | ICD-10-CM | POA: Insufficient documentation

## 2019-01-07 ENCOUNTER — Encounter: Payer: PRIVATE HEALTH INSURANCE | Admitting: Advanced Practice Midwife

## 2019-01-10 ENCOUNTER — Other Ambulatory Visit: Payer: PRIVATE HEALTH INSURANCE | Admitting: Adult Health

## 2019-01-10 ENCOUNTER — Ambulatory Visit (INDEPENDENT_AMBULATORY_CARE_PROVIDER_SITE_OTHER): Payer: PRIVATE HEALTH INSURANCE | Admitting: Advanced Practice Midwife

## 2019-01-10 ENCOUNTER — Other Ambulatory Visit: Payer: Self-pay

## 2019-01-10 VITALS — BP 135/84 | HR 84 | Wt 250.0 lb

## 2019-01-10 DIAGNOSIS — Z3202 Encounter for pregnancy test, result negative: Secondary | ICD-10-CM | POA: Diagnosis not present

## 2019-01-10 DIAGNOSIS — Z975 Presence of (intrauterine) contraceptive device: Secondary | ICD-10-CM

## 2019-01-10 DIAGNOSIS — Z3046 Encounter for surveillance of implantable subdermal contraceptive: Secondary | ICD-10-CM | POA: Diagnosis not present

## 2019-01-10 LAB — POCT URINE PREGNANCY: Preg Test, Ur: NEGATIVE

## 2019-01-10 NOTE — Addendum Note (Signed)
Addended by: Christin Fudge on: 01/10/2019 12:50 PM   Modules accepted: Level of Service

## 2019-01-10 NOTE — Progress Notes (Addendum)
[name] 33 y.o. Vitals:   01/10/19 0945  BP: 135/84  Pulse: 84    Past Medical History:  Diagnosis Date  . Abnormal uterine bleeding (AUB) 01/19/2015  . Complication of anesthesia   . Depression   . Family history of adverse reaction to anesthesia   . Fibrocystic breast changes   . Hypertension   . Hyperthyroidism   . Migraines   . Nexplanon in place 01/19/2015  . Nexplanon insertion 11/29/2012   inserted nexplanon 11/29/12 in left arm, remove 11/30/15  . Obesity   . Preterm delivery   . Seasonal rhinitis   . UTI (lower urinary tract infection)     Family History  Problem Relation Age of Onset  . Hypertension Mother   . Hypertension Father   . Hypertension Maternal Grandmother   . Hypertension Maternal Grandfather   . Diabetes Paternal Grandmother   . Hypertension Paternal Grandmother   . Heart disease Paternal Grandfather   . Diabetes Paternal Grandfather   . Hypertension Paternal Grandfather     Social History   Socioeconomic History  . Marital status: Married    Spouse name: Not on file  . Number of children: Not on file  . Years of education: Not on file  . Highest education level: Not on file  Occupational History  . Not on file  Social Needs  . Financial resource strain: Not on file  . Food insecurity    Worry: Not on file    Inability: Not on file  . Transportation needs    Medical: Not on file    Non-medical: Not on file  Tobacco Use  . Smoking status: Never Smoker  . Smokeless tobacco: Never Used  Substance and Sexual Activity  . Alcohol use: No  . Drug use: No  . Sexual activity: Yes    Birth control/protection: Implant  Lifestyle  . Physical activity    Days per week: Not on file    Minutes per session: Not on file  . Stress: Not on file  Relationships  . Social Herbalist on phone: Not on file    Gets together: Not on file    Attends religious service: Not on file    Active member of club or organization: Not on  file    Attends meetings of clubs or organizations: Not on file    Relationship status: Not on file  . Intimate partner violence    Fear of current or ex partner: Not on file    Emotionally abused: Not on file    Physically abused: Not on file    Forced sexual activity: Not on file  Other Topics Concern  . Not on file  Social History Narrative  . Not on file    HPI:  [name] is here for Nexplanon removal and reinsertion.  She was given informed consent for removal of her Nexplanon and reinsertion of a new one.A signed copy is in the chart.  Appropriate time out taken. Nexlanon site (left arm) identified and thea area was prepped in usual sterile fashon. 2 cc of 1% lidocaine was used to anesthetize the area starting with the distal end of the implant. Even after 3 more injections of 1- 2% lidocaine (bottle switched out) over the next hour, pt did not get adequately numb. Finally just said "go for it"  Tolerated attempt well.  By  Now, tissues were puffy from all the local anesthesia, and device had migrated to the  right.  After several attempts, Dr. Alysia Penna asked to try. He too was unable to get it out.  Pt will come back in 2 weeks and try again (hopefully she will get numb quicker and w/o so much lidocaine, and edema will be gone).  There is the possiblity that she may not get it replaced so that she can get pregnan or just do COCs for a while.   Pt will let us know.

## 2019-01-11 ENCOUNTER — Other Ambulatory Visit (HOSPITAL_COMMUNITY)
Admission: RE | Admit: 2019-01-11 | Discharge: 2019-01-11 | Disposition: A | Discharge status: Home / Self Care | Payer: PRIVATE HEALTH INSURANCE | Source: Ambulatory Visit | Attending: Family Medicine | Admitting: Family Medicine

## 2019-01-11 ENCOUNTER — Ambulatory Visit (HOSPITAL_COMMUNITY)
Admission: RE | Admit: 2019-01-11 | Discharge: 2019-01-11 | Disposition: A | Discharge status: Home / Self Care | Payer: PRIVATE HEALTH INSURANCE | Source: Ambulatory Visit | Attending: Family Medicine | Admitting: Family Medicine

## 2019-01-11 DIAGNOSIS — E049 Nontoxic goiter, unspecified: Secondary | ICD-10-CM | POA: Diagnosis not present

## 2019-01-11 LAB — CBC WITH DIFFERENTIAL/PLATELET
Abs Immature Granulocytes: 0.02 10*3/uL (ref 0.00–0.07)
Basophils Absolute: 0 10*3/uL (ref 0.0–0.1)
Basophils Relative: 0 %
Eosinophils Absolute: 0.3 10*3/uL (ref 0.0–0.5)
Eosinophils Relative: 3 %
HCT: 42.5 % (ref 36.0–46.0)
Hemoglobin: 14 g/dL (ref 12.0–15.0)
Immature Granulocytes: 0 %
Lymphocytes Relative: 31 %
Lymphs Abs: 2.9 10*3/uL (ref 0.7–4.0)
MCH: 30.4 pg (ref 26.0–34.0)
MCHC: 32.9 g/dL (ref 30.0–36.0)
MCV: 92.4 fL (ref 80.0–100.0)
Monocytes Absolute: 0.6 10*3/uL (ref 0.1–1.0)
Monocytes Relative: 7 %
Neutro Abs: 5.4 10*3/uL (ref 1.7–7.7)
Neutrophils Relative %: 59 %
Platelets: 332 10*3/uL (ref 150–400)
RBC: 4.6 MIL/uL (ref 3.87–5.11)
RDW: 12.1 % (ref 11.5–15.5)
WBC: 9.3 10*3/uL (ref 4.0–10.5)
nRBC: 0 % (ref 0.0–0.2)

## 2019-01-11 LAB — COMPREHENSIVE METABOLIC PANEL
ALT: 29 U/L (ref 0–44)
AST: 25 U/L (ref 15–41)
Albumin: 4.5 g/dL (ref 3.5–5.0)
Alkaline Phosphatase: 64 U/L (ref 38–126)
Anion gap: 8 (ref 5–15)
BUN: 9 mg/dL (ref 6–20)
CO2: 23 mmol/L (ref 22–32)
Calcium: 9.4 mg/dL (ref 8.9–10.3)
Chloride: 106 mmol/L (ref 98–111)
Creatinine, Ser: 0.65 mg/dL (ref 0.44–1.00)
GFR calc Af Amer: >60 mL/min (ref >60)
GFR calc non Af Amer: >60 mL/min (ref >60)
Glucose, Bld: 94 mg/dL (ref 70–99)
Potassium: 4.2 mmol/L (ref 3.5–5.1)
Sodium: 137 mmol/L (ref 135–145)
Total Bilirubin: 0.5 mg/dL (ref 0.3–1.2)
Total Protein: 8.6 g/dL — ABNORMAL HIGH (ref 6.5–8.1)

## 2019-01-11 LAB — TSH: TSH: 1.592 u[IU]/mL (ref 0.350–4.500)

## 2019-01-11 LAB — T4, FREE: Free T4: 1.08 ng/dL (ref 0.61–1.12)

## 2019-01-15 ENCOUNTER — Ambulatory Visit: Payer: PRIVATE HEALTH INSURANCE | Admitting: Family Medicine

## 2019-01-16 ENCOUNTER — Ambulatory Visit: Payer: PRIVATE HEALTH INSURANCE | Admitting: Family Medicine

## 2019-01-16 ENCOUNTER — Encounter: Payer: Self-pay | Admitting: Family Medicine

## 2019-01-24 ENCOUNTER — Ambulatory Visit (INDEPENDENT_AMBULATORY_CARE_PROVIDER_SITE_OTHER): Payer: PRIVATE HEALTH INSURANCE | Admitting: Obstetrics and Gynecology

## 2019-01-24 ENCOUNTER — Other Ambulatory Visit: Payer: Self-pay

## 2019-01-24 ENCOUNTER — Encounter: Payer: Self-pay | Admitting: Adult Health

## 2019-01-24 VITALS — BP 134/91 | HR 83 | Ht 63.0 in | Wt 262.0 lb

## 2019-01-24 DIAGNOSIS — Z30017 Encounter for initial prescription of implantable subdermal contraceptive: Secondary | ICD-10-CM | POA: Diagnosis not present

## 2019-01-24 DIAGNOSIS — Z3202 Encounter for pregnancy test, result negative: Secondary | ICD-10-CM

## 2019-01-24 LAB — POCT URINE PREGNANCY: Preg Test, Ur: NEGATIVE

## 2019-01-24 NOTE — Progress Notes (Signed)
  GYNECOLOGY CLINIC PROCEDURE NOTE  [name] is a 33 y.o. [phone] here for Nexplanon removal and Nexplanon insertion.  .  No other gynecologic concerns.      Nexplanon Removal and Insertion  Patient identified, informed consent performed, consent signed.   Patient does understand that irregular bleeding is a very common side effect of this medication. She was advised to have backup contraception for one week after replacement of the implant. Pregnancy test in clinic today was negative.  Appropriate time out taken. Implanon site identified. Area prepped in usual sterile fashon. One ml of 1% lidocaine was used to anesthetize the area at the distal end of the implant. A small stab incision was made right beside the implant on the distal portion. The Nexplanon rod was grasped using hemostats and removed without difficulty. There was minimal blood loss. There were no complications. Area was then injected with 3 ml of 1 % lidocaine. She was re-prepped with betadine, Nexplanon removed from packaging, Device confirmed in needle, then inserted full length of needle and withdrawn per handbook instructions. Nexplanon was able to palpated in the patient's arm..  There was minimal blood loss. Patient insertion site covered with guaze and a pressure bandage to reduce any bruising. The patient tolerated the procedure well and was given post procedure instructions.  She was advised to have backup contraception for one week.

## 2019-03-11 ENCOUNTER — Other Ambulatory Visit: Payer: PRIVATE HEALTH INSURANCE | Admitting: Women's Health

## 2019-04-10 ENCOUNTER — Telehealth: Payer: Self-pay | Admitting: Cardiovascular Disease

## 2019-04-10 NOTE — Telephone Encounter (Signed)

## 2019-04-11 ENCOUNTER — Other Ambulatory Visit: Payer: Self-pay | Admitting: Cardiovascular Disease

## 2019-04-11 ENCOUNTER — Ambulatory Visit (INDEPENDENT_AMBULATORY_CARE_PROVIDER_SITE_OTHER): Payer: PRIVATE HEALTH INSURANCE | Admitting: Cardiovascular Disease

## 2019-04-11 ENCOUNTER — Encounter: Payer: Self-pay | Admitting: Cardiovascular Disease

## 2019-04-11 ENCOUNTER — Other Ambulatory Visit: Payer: Self-pay

## 2019-04-11 VITALS — BP 113/79 | HR 88 | Ht 63.0 in | Wt 268.4 lb

## 2019-04-11 DIAGNOSIS — R002 Palpitations: Secondary | ICD-10-CM

## 2019-04-11 DIAGNOSIS — R5383 Other fatigue: Secondary | ICD-10-CM

## 2019-04-11 DIAGNOSIS — G4730 Sleep apnea, unspecified: Secondary | ICD-10-CM

## 2019-04-11 DIAGNOSIS — R000 Tachycardia, unspecified: Secondary | ICD-10-CM

## 2019-04-11 NOTE — Progress Notes (Signed)
CARDIOLOGY CONSULT NOTE  Patient ID: [name] MRN: [id number] DOB/AGE: March 31, 1986 34 y.o.  Admit date: (Not on file) Primary Physician: Maryruth Hancock, MD  Reason for Consultation: Tachycardia and palpitations  HPI: [name] is a 34 y.o. female who is being seen today for the evaluation of tachycardia and palpitations at the request of Corum, Rex Kras, MD.  I reviewed notes from her PCP dated 01/01/2019.  At that time she complained of elevated heart rates and chest pain related to this.  Caffeine appeared to make symptoms worse along with energy drinks.  I reviewed labs dated 01/11/2019: Normal TSH and free T4, normal renal function, and normal CBC.  I personally reviewed the ECG performed on 01/01/2019 which demonstrated sinus tachycardia, 119 bpm.  She told me she had a tibia/fibula fracture and underwent surgery in March 2016.  Ever since that time she has experienced intermittent tachycardia and palpitations.    She feels fatigued all the time and says she has no energy.  This has been going on for over a year.  She denies a history of snoring and there have been no witnessed apneic episodes by her husband.    When she has palpitations episodes can last anywhere between 20 and 30 minutes.  She once had upper left-sided chest pain radiating to her arm but this was an isolated instance.  She drinks water primarily and does not drink soda.  She drinks 1 cup of coffee in the morning.  During the day she may drink one half sweetened tea.  She denies orthopnea and leg swelling.   Allergies  Allergen Reactions  . Hydrocodone   . Codeine Itching and Other (See Comments)    Does not like the way it makes her feel.  . Dilaudid [Hydromorphone Hcl] Hives and Itching  . Hydromorphone   . Iodine Itching  . Shellfish Allergy Rash    Current Outpatient Medications  Medication Sig Dispense Refill  . albuterol (PROVENTIL HFA;VENTOLIN HFA) 108 (90 Base) MCG/ACT  inhaler Inhale 2 puffs into the lungs every 6 (six) hours as needed for wheezing or shortness of breath.    . cetirizine (ZYRTEC) 10 MG tablet Take 10 mg by mouth daily as needed for allergies.    Marland Kitchen etonogestrel (NEXPLANON) 68 MG IMPL implant Inject 1 each into the skin once.     No current facility-administered medications for this visit.    Past Medical History:  Diagnosis Date  . Abnormal uterine bleeding (AUB) 01/19/2015  . Complication of anesthesia   . Depression   . Family history of adverse reaction to anesthesia   . Fibrocystic breast changes   . Hypertension   . Hyperthyroidism   . Migraines   . Nexplanon in place 01/19/2015  . Nexplanon insertion 11/29/2012   inserted nexplanon 11/29/12 in left arm, remove 11/30/15  . Obesity   . Preterm delivery   . Seasonal rhinitis   . UTI (lower urinary tract infection)     Past Surgical History:  Procedure Laterality Date  . CHOLECYSTECTOMY    . ORIF TIBIA PLATEAU Left 07/01/2015   Procedure: OPEN REDUCTION INTERNAL FIXATION (ORIF) TIBIAL PLATEAU AND BONE GRAFT LEFT TIBIAL PLATEAU;  Surgeon: Carole Civil, MD;  Location: [facility];  Service: Orthopedics;  Laterality: Left;  . TONSILLECTOMY      Social History   Socioeconomic History  . Marital status: Married    Spouse name: Not on file  . Number of  children: 2  . Years of education: Not on file  . Highest education level: Not on file  Occupational History  . Not on file  Tobacco Use  . Smoking status: Never Smoker  . Smokeless tobacco: Never Used  Substance and Sexual Activity  . Alcohol use: No  . Drug use: No  . Sexual activity: Yes    Birth control/protection: Implant  Other Topics Concern  . Not on file  Social History Narrative  . Not on file   Social Determinants of Health   Financial Resource Strain:   . Difficulty of Paying Living Expenses: Not on file  Food Insecurity:   . Worried About Programme researcher, broadcasting/film/video in the Last Year: Not on file  . Ran Out  of Food in the Last Year: Not on file  Transportation Needs:   . Lack of Transportation (Medical): Not on file  . Lack of Transportation (Non-Medical): Not on file  Physical Activity:   . Days of Exercise per Week: Not on file  . Minutes of Exercise per Session: Not on file  Stress:   . Feeling of Stress : Not on file  Social Connections:   . Frequency of Communication with Friends and Family: Not on file  . Frequency of Social Gatherings with Friends and Family: Not on file  . Attends Religious Services: Not on file  . Active Member of Clubs or Organizations: Not on file  . Attends Banker Meetings: Not on file  . Marital Status: Not on file  Intimate Partner Violence:   . Fear of Current or Ex-Partner: Not on file  . Emotionally Abused: Not on file  . Physically Abused: Not on file  . Sexually Abused: Not on file     No family history of premature CAD in 1st degree relatives.  Current Meds  Medication Sig  . albuterol (PROVENTIL HFA;VENTOLIN HFA) 108 (90 Base) MCG/ACT inhaler Inhale 2 puffs into the lungs every 6 (six) hours as needed for wheezing or shortness of breath.  . cetirizine (ZYRTEC) 10 MG tablet Take 10 mg by mouth daily as needed for allergies.  Marland Kitchen etonogestrel (NEXPLANON) 68 MG IMPL implant Inject 1 each into the skin once.      Review of systems complete and found to be negative unless listed above in HPI    Physical exam Blood pressure 135/81, pulse 96, height 5\' 3"  (1.6 m), weight 268 lb 6.4 oz (121.7 kg), SpO2 99 %. General: NAD, obese Neck: No JVD, no thyromegaly or thyroid nodule.  Lungs: Clear to auscultation bilaterally with normal respiratory effort. CV: Nondisplaced PMI. Regular rate and rhythm, normal S1/S2, no S3/S4, no murmur.  No peripheral edema.  No carotid bruit.    Abdomen: Soft, nontender, no distention.  Skin: Intact without lesions or rashes.  Neurologic: Alert and oriented x 3.  Psych: Normal affect. Extremities: No  clubbing or cyanosis.  HEENT: Normal.   ECG: Most recent ECG reviewed.   Labs: Lab Results  Component Value Date/Time   K 4.2 01/11/2019 02:07 PM   BUN 9 01/11/2019 02:07 PM   CREATININE 0.65 01/11/2019 02:07 PM   ALT 29 01/11/2019 02:07 PM   TSH 1.592 01/11/2019 02:08 PM   TSH 1.630 07/19/2018 11:14 AM   HGB 14.0 01/11/2019 02:07 PM   HGB 12.1 03/26/2012 12:00 AM     Lipids: Lab Results  Component Value Date/Time   LDLCALC 74 05/09/2007 07:44 PM   CHOL 130 05/09/2007 07:44 PM  TRIG 64 05/09/2007 07:44 PM   HDL 43 05/09/2007 07:44 PM        ASSESSMENT AND PLAN:  1.  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  Currently symptoms are not lifestyle limiting so I will hold off on the addition of AV nodal blocking agents. I will order a 2-D echocardiogram with Doppler to evaluate cardiac structure, function, and regional wall motion.  2.  Fatigue/sleep disordered breathing: Given her obesity and inappropriate sinus tachycardia along with symptoms of excessive fatigue and morning headaches, she is at high risk for having obstructive sleep apnea.  I will make a referral.     Disposition: Follow up virtual visit in 3 months  Signed: Prentice Docker, M.D., F.A.C.C.  04/11/2019, 10:31 AM

## 2019-04-11 NOTE — Patient Instructions (Addendum)
Medication Instructions:  Continue all current medications.  Labwork: none  Testing/Procedures:  Your physician has requested that you have an echocardiogram. Echocardiography is a painless test that uses sound waves to create images of your heart. It provides your doctor with information about the size and shape of your heart and how well your heart's chambers and valves are working. This procedure takes approximately one hour. There are no restrictions for this procedure.  Office will contact with results via phone or letter.    Follow-Up: You have been referred to:  [facility] 3 months   Any Other Special Instructions Will Be Listed Below (If Applicable).  If you need a refill on your cardiac medications before your next appointment, please call your pharmacy.

## 2019-04-24 ENCOUNTER — Telehealth: Payer: Self-pay | Admitting: *Deleted

## 2019-04-24 ENCOUNTER — Ambulatory Visit (INDEPENDENT_AMBULATORY_CARE_PROVIDER_SITE_OTHER): Payer: PRIVATE HEALTH INSURANCE

## 2019-04-24 ENCOUNTER — Other Ambulatory Visit: Payer: Self-pay

## 2019-04-24 DIAGNOSIS — R000 Tachycardia, unspecified: Secondary | ICD-10-CM

## 2019-04-24 NOTE — Telephone Encounter (Signed)
-----   Message from Laqueta Linden, MD sent at 04/24/2019  4:19 PM EST ----- Normal echo

## 2019-04-24 NOTE — Telephone Encounter (Signed)
Pt aware - routed to pcp  

## 2019-04-29 ENCOUNTER — Institutional Professional Consult (permissible substitution): Payer: Self-pay | Admitting: Neurology

## 2019-06-12 ENCOUNTER — Other Ambulatory Visit: Payer: PRIVATE HEALTH INSURANCE | Admitting: Adult Health

## 2019-07-10 ENCOUNTER — Encounter: Payer: Self-pay | Admitting: Cardiovascular Disease

## 2019-07-10 ENCOUNTER — Telehealth (INDEPENDENT_AMBULATORY_CARE_PROVIDER_SITE_OTHER): Payer: PRIVATE HEALTH INSURANCE | Admitting: Cardiovascular Disease

## 2019-07-10 VITALS — Ht 63.0 in | Wt 260.0 lb

## 2019-07-10 DIAGNOSIS — R5383 Other fatigue: Secondary | ICD-10-CM

## 2019-07-10 DIAGNOSIS — R002 Palpitations: Secondary | ICD-10-CM

## 2019-07-10 DIAGNOSIS — G4730 Sleep apnea, unspecified: Secondary | ICD-10-CM

## 2019-07-10 DIAGNOSIS — R000 Tachycardia, unspecified: Secondary | ICD-10-CM

## 2019-07-10 NOTE — Progress Notes (Signed)
Virtual Visit via Telephone Note   This visit type was conducted due to national recommendations for restrictions regarding the COVID-19 Pandemic (e.g. social distancing) in an effort to limit this patient's exposure and mitigate transmission in our community.  Due to her co-morbid illnesses, this patient is at least at moderate risk for complications without adequate follow up.  This format is felt to be most appropriate for this patient at this time.  The patient did not have access to video technology/had technical difficulties with video requiring transitioning to audio format only (telephone).  All issues noted in this document were discussed and addressed.  No physical exam could be performed with this format.  Please refer to the patient's chart for her  consent to telehealth for [facility].   The patient was identified using 2 identifiers.  Date:  07/10/2019   ID:  [name], DOB 24-Nov-1985, MRN [id number]  Patient Location: Home Provider Location: Office  PCP:  Maryruth Hancock, MD  Cardiologist:  Kate Sable, MD  Electrophysiologist:  None   Evaluation Performed:  Follow-Up Visit  Chief Complaint: Tachycardia and palpitations  History of Present Illness:    [name] is a 34 y.o. female with tachycardia and palpitations.  I initially evaluated her on 04/11/2019.  She previously told me she had a tibia/fibula fracture and underwent surgery in March 2016.  Ever since that time she has experienced intermittent tachycardia and palpitations.   She denies chest pain.  She continues to have palpitations occurring intermittently but they are not lifestyle limiting.  She would like to begin exercising for weight loss and we discussed how heart rates would be affected.    Past Medical History:  Diagnosis Date  . Abnormal uterine bleeding (AUB) 01/19/2015  . Complication of anesthesia   . Depression   . Family history of adverse reaction to anesthesia   .  Fibrocystic breast changes   . Hypertension   . Hyperthyroidism   . Migraines   . Nexplanon in place 01/19/2015  . Nexplanon insertion 11/29/2012   inserted nexplanon 11/29/12 in left arm, remove 11/30/15  . Obesity   . Preterm delivery   . Seasonal rhinitis   . UTI (lower urinary tract infection)    Past Surgical History:  Procedure Laterality Date  . CHOLECYSTECTOMY    . ORIF TIBIA PLATEAU Left 07/01/2015   Procedure: OPEN REDUCTION INTERNAL FIXATION (ORIF) TIBIAL PLATEAU AND BONE GRAFT LEFT TIBIAL PLATEAU;  Surgeon: Carole Civil, MD;  Location: [facility];  Service: Orthopedics;  Laterality: Left;  . TONSILLECTOMY       Current Meds  Medication Sig  . albuterol (PROVENTIL HFA;VENTOLIN HFA) 108 (90 Base) MCG/ACT inhaler Inhale 2 puffs into the lungs every 6 (six) hours as needed for wheezing or shortness of breath.  . cetirizine (ZYRTEC) 10 MG tablet Take 10 mg by mouth daily as needed for allergies.  Marland Kitchen etonogestrel (NEXPLANON) 68 MG IMPL implant Inject 1 each into the skin once.     Allergies:   Hydrocodone, Codeine, Dilaudid [hydromorphone hcl], Hydromorphone, Iodine, and Shellfish allergy   Social History   Tobacco Use  . Smoking status: Never Smoker  . Smokeless tobacco: Never Used  Substance Use Topics  . Alcohol use: No  . Drug use: No     Family Hx: The patient's family history includes Diabetes in her paternal grandfather and paternal grandmother; Heart disease in her paternal grandfather; Hypertension in her father, maternal grandfather, maternal grandmother, mother, paternal  grandfather, and paternal grandmother.  ROS:   Please see the history of present illness.     All other systems reviewed and are negative.   Prior CV studies:   The following studies were reviewed today:  Echocardiogram 04/24/2019:  1. Left ventricular ejection fraction, by visual estimation, is 60 to  65%. The left ventricle has normal function. There is no left ventricular   hypertrophy.  2. The left ventricle has no regional wall motion abnormalities.  3. Global right ventricle has normal systolic function.The right  ventricular size is normal. No increase in right ventricular wall  thickness.  4. Left atrial size was normal.  5. Right atrial size was normal.  6. The mitral valve is grossly normal. Trivial mitral valve  regurgitation.  7. The tricuspid valve is grossly normal.  8. The tricuspid valve is grossly normal. Tricuspid valve regurgitation  is trivial.  9. The aortic valve is tricuspid. Aortic valve regurgitation is not  visualized.  10. The pulmonic valve was grossly normal. Pulmonic valve regurgitation is  not visualized.  11. TR signal is inadequate for assessing pulmonary artery systolic  pressure.  12. The inferior vena cava is normal in size with greater than 50%  respiratory variability, suggesting right atrial pressure of 3 mmHg.   Labs/Other Tests and Data Reviewed:    EKG:  No ECG reviewed.  Recent Labs: 01/11/2019: ALT 29; BUN 9; Creatinine, Ser 0.65; Hemoglobin 14.0; Platelets 332; Potassium 4.2; Sodium 137; TSH 1.592   Recent Lipid Panel Lab Results  Component Value Date/Time   CHOL 130 05/09/2007 07:44 PM   TRIG 64 05/09/2007 07:44 PM   HDL 43 05/09/2007 07:44 PM   CHOLHDL 3.0 Ratio 05/09/2007 07:44 PM   LDLCALC 74 05/09/2007 07:44 PM    Wt Readings from Last 3 Encounters:  07/10/19 260 lb (117.9 kg)  04/11/19 268 lb 6.4 oz (121.7 kg)  01/24/19 262 lb (118.8 kg)     Objective:    Vital Signs:  Ht 5\' 3"  (1.6 m)   Wt 260 lb (117.9 kg)   BMI 46.06 kg/m    VITAL SIGNS:  reviewed  ASSESSMENT & PLAN:    1.  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  Currently symptoms are not lifestyle limiting so I will hold off on the addition of AV nodal blocking agents.  Echocardiogram was normal on 04/24/2019.  I did encourage exercise for weight loss.  2.  Fatigue/sleep disordered  breathing: Given her obesity and inappropriate sinus tachycardia along with symptoms of excessive fatigue and morning headaches, she is at high risk for having obstructive sleep apnea.  I already made a referral.    COVID-19 Education: The signs and symptoms of COVID-19 were discussed with the patient and how to seek care for testing (follow up with PCP or arrange E-visit).  The importance of social distancing was discussed today.  Time:   Today, I have spent 15 minutes with the patient with telehealth technology discussing the above problems.     Medication Adjustments/Labs and Tests Ordered: Current medicines are reviewed at length with the patient today.  Concerns regarding medicines are outlined above.   Tests Ordered: No orders of the defined types were placed in this encounter.   Medication Changes: No orders of the defined types were placed in this encounter.   Follow Up:  Virtual Visit  prn  Signed, 04/26/2019, MD  07/10/2019 9:47 AM    [facility]

## 2019-07-10 NOTE — Patient Instructions (Signed)
Medication Instructions:  Your physician recommends that you continue on your current medications as directed. Please refer to the Current Medication list given to you today.  *If you need a refill on your cardiac medications before your next appointment, please call your pharmacy*   Lab Work: None today If you have labs (blood work) drawn today and your tests are completely normal, you will receive your results only by: . MyChart Message (if you have MyChart) OR . A paper copy in the mail If you have any lab test that is abnormal or we need to change your treatment, we will call you to review the results.   Testing/Procedures: None today   Follow-Up: At [facility], you and your health needs are our priority.  As part of our continuing mission to provide you with exceptional heart care, we have created designated Provider Care Teams.  These Care Teams include your primary Cardiologist (physician) and Advanced Practice Providers (APPs -  Physician Assistants and Nurse Practitioners) who all work together to provide you with the care you need, when you need it.  We recommend signing up for the patient portal called "MyChart".  Sign up information is provided on this After Visit Summary.  MyChart is used to connect with patients for Virtual Visits (Telemedicine).  Patients are able to view lab/test results, encounter notes, upcoming appointments, etc.  Non-urgent messages can be sent to your provider as well.   To learn more about what you can do with MyChart, go to [email or web address].    Your next appointment:  As needed with Dr.Koneswaran     Thank you for choosing [facility] !        

## 2019-11-25 ENCOUNTER — Ambulatory Visit: Payer: PRIVATE HEALTH INSURANCE | Admitting: Adult Health

## 2019-12-12 ENCOUNTER — Other Ambulatory Visit: Payer: PRIVATE HEALTH INSURANCE | Admitting: Adult Health

## 2019-12-31 ENCOUNTER — Encounter (HOSPITAL_COMMUNITY): Payer: Self-pay | Admitting: *Deleted

## 2019-12-31 ENCOUNTER — Emergency Department (HOSPITAL_COMMUNITY): Payer: PRIVATE HEALTH INSURANCE

## 2019-12-31 ENCOUNTER — Emergency Department (HOSPITAL_COMMUNITY)
Admission: EM | Admit: 2019-12-31 | Discharge: 2019-12-31 | Disposition: A | Discharge status: Home / Self Care | Payer: PRIVATE HEALTH INSURANCE | Attending: Emergency Medicine | Admitting: Emergency Medicine

## 2019-12-31 ENCOUNTER — Other Ambulatory Visit: Payer: Self-pay

## 2019-12-31 DIAGNOSIS — U071 COVID-19: Secondary | ICD-10-CM

## 2019-12-31 DIAGNOSIS — R079 Chest pain, unspecified: Secondary | ICD-10-CM

## 2019-12-31 DIAGNOSIS — I10 Essential (primary) hypertension: Secondary | ICD-10-CM | POA: Insufficient documentation

## 2019-12-31 DIAGNOSIS — R000 Tachycardia, unspecified: Secondary | ICD-10-CM | POA: Diagnosis present

## 2019-12-31 DIAGNOSIS — R002 Palpitations: Secondary | ICD-10-CM | POA: Insufficient documentation

## 2019-12-31 DIAGNOSIS — E039 Hypothyroidism, unspecified: Secondary | ICD-10-CM | POA: Diagnosis not present

## 2019-12-31 LAB — BASIC METABOLIC PANEL
Anion gap: 12 (ref 5–15)
BUN: 9 mg/dL (ref 6–20)
CO2: 23 mmol/L (ref 22–32)
Calcium: 9.7 mg/dL (ref 8.9–10.3)
Chloride: 104 mmol/L (ref 98–111)
Creatinine, Ser: 0.67 mg/dL (ref 0.44–1.00)
GFR calc Af Amer: >60 mL/min (ref >60)
GFR calc non Af Amer: >60 mL/min (ref >60)
Glucose, Bld: 104 mg/dL — ABNORMAL HIGH (ref 70–99)
Potassium: 3.7 mmol/L (ref 3.5–5.1)
Sodium: 139 mmol/L (ref 135–145)

## 2019-12-31 LAB — CBC
HCT: 44.8 % (ref 36.0–46.0)
Hemoglobin: 14.9 g/dL (ref 12.0–15.0)
MCH: 30.2 pg (ref 26.0–34.0)
MCHC: 33.3 g/dL (ref 30.0–36.0)
MCV: 90.7 fL (ref 80.0–100.0)
Platelets: 436 10*3/uL — ABNORMAL HIGH (ref 150–400)
RBC: 4.94 MIL/uL (ref 3.87–5.11)
RDW: 11.7 % (ref 11.5–15.5)
WBC: 8.6 10*3/uL (ref 4.0–10.5)
nRBC: 0 % (ref 0.0–0.2)

## 2019-12-31 LAB — TROPONIN I (HIGH SENSITIVITY)
Troponin I (High Sensitivity): <2 ng/L (ref <18)
Troponin I (High Sensitivity): <2 ng/L (ref <18)

## 2019-12-31 LAB — HCG, QUANTITATIVE, PREGNANCY: hCG, Beta Chain, Quant, S: <1 m[IU]/mL (ref <5)

## 2019-12-31 MED ORDER — SODIUM CHLORIDE 0.9 % IV BOLUS
1000.0000 mL | Freq: Once | INTRAVENOUS | Status: AC
  Administered 2019-12-31: 1000 mL via INTRAVENOUS

## 2019-12-31 MED ORDER — IOHEXOL 350 MG/ML SOLN
100.0000 mL | Freq: Once | INTRAVENOUS | Status: AC | PRN
  Administered 2019-12-31: 100 mL via INTRAVENOUS

## 2019-12-31 NOTE — Discharge Instructions (Addendum)
Rest and make sure you are drinking plenty of fluids.  Your labs and Ct imaging are negative today for a pulmonary embolism or pneumonia.  Followup with your primary MD or return here if you develop any new or worsened symptoms.  Maintain home isolation/quarantine as recommended with your initial Covid diagnosis.

## 2019-12-31 NOTE — ED Provider Notes (Signed)
[facility] EMERGENCY DEPARTMENT Provider Note   CSN: [id number] Arrival date & time: 12/31/19  1243     History Chief Complaint  Patient presents with  . Tachycardia    [name] is a 34 y.o. female with a history as outlined below and including benign intermittent tachycardia who was diagnosed with COVID-19 on 12/25/2019.  She presents secondary to increased heart rate which began yesterday evening.  She denies chest pain, also denies shortness of breath, pleuritic pain, dizziness or lightheadedness but does have generalized fatigue.  She has had a mild nonproductive cough.  Also denies peripheral edema, no leg pain.  She has had no nausea or vomiting, and has been trying to consume more liquids but has had maybe a little less urine production since yesterday.  She has seen Dr. Purvis Sheffield for her tachycardia and work-up including echocardiogram and EKGs were reassuring.  She has been taking Mucinex DM for symptom relief, she also took a dose of NyQuil 2 evenings ago.   The history is provided by the patient.       Past Medical History:  Diagnosis Date  . Abnormal uterine bleeding (AUB) 01/19/2015  . Complication of anesthesia   . Depression   . Family history of adverse reaction to anesthesia   . Fibrocystic breast changes   . Hypertension   . Hyperthyroidism   . Migraines   . Nexplanon in place 01/19/2015  . Nexplanon insertion 11/29/2012   inserted nexplanon 11/29/12 in left arm, remove 11/30/15  . Obesity   . Preterm delivery   . Seasonal rhinitis   . UTI (lower urinary tract infection)     Patient Active Problem List   Diagnosis Date Noted  . Enlarged thyroid gland 01/04/2019  . Feeling tired 11/14/2016  . Tachycardia 07/01/2015  . Tibial plateau fracture   . Abnormal uterine bleeding (AUB) 01/19/2015  . Nexplanon in place 01/19/2015  . Breakthrough bleeding on Nexplanon 06/13/2013  . Nexplanon insertion 11/29/2012  . OBESITY 10/31/2007  . DEPRESSION  10/31/2007  . ALLERGIC RHINITIS, SEASONAL 10/31/2007  . FIBROCYSTIC BREAST DISEASE 10/31/2007    Past Surgical History:  Procedure Laterality Date  . CHOLECYSTECTOMY    . ORIF TIBIA PLATEAU Left 07/01/2015   Procedure: OPEN REDUCTION INTERNAL FIXATION (ORIF) TIBIAL PLATEAU AND BONE GRAFT LEFT TIBIAL PLATEAU;  Surgeon: [name], MD;  Location: [facility];  Service: Orthopedics;  Laterality: Left;  . TONSILLECTOMY       OB History    Gravida  3   Para  2   Term      Preterm  2   AB  1   Living  2     SAB  1   TAB      Ectopic      Multiple      Live Births  2           Family History  Problem Relation Age of Onset  . Hypertension Mother   . Hypertension Father   . Hypertension Maternal Grandmother   . Hypertension Maternal Grandfather   . Diabetes Paternal Grandmother   . Hypertension Paternal Grandmother   . Heart disease Paternal Grandfather   . Diabetes Paternal Grandfather   . Hypertension Paternal Grandfather     Social History   Tobacco Use  . Smoking status: Never Smoker  . Smokeless tobacco: Never Used  Vaping Use  . Vaping Use: Never used  Substance Use Topics  . Alcohol use: No  .  Drug use: No    Home Medications Prior to Admission medications   Medication Sig Start Date End Date Taking? Authorizing Provider  albuterol (PROVENTIL HFA;VENTOLIN HFA) 108 (90 Base) MCG/ACT inhaler Inhale 2 puffs into the lungs every 6 (six) hours as needed for wheezing or shortness of breath.    [provider]  cetirizine (ZYRTEC) 10 MG tablet Take 10 mg by mouth daily as needed for allergies.    [provider]  etonogestrel (NEXPLANON) 68 MG IMPL implant Inject 1 each into the skin once.    [provider]    Allergies    Hydrocodone, Codeine, Dilaudid [hydromorphone hcl], Hydromorphone, Iodine, and Shellfish allergy  Review of Systems   Review of Systems  Constitutional: Positive for fatigue. Negative for fever.   HENT: Negative for congestion and sore throat.   Eyes: Negative.   Respiratory: Negative for chest tightness, shortness of breath and wheezing.   Cardiovascular: Positive for palpitations. Negative for chest pain and leg swelling.  Gastrointestinal: Negative for abdominal pain, nausea and vomiting.  Genitourinary: Negative.  Negative for dysuria.  Musculoskeletal: Negative for arthralgias, joint swelling and neck pain.  Skin: Negative.  Negative for rash and wound.  Neurological: Negative for dizziness, weakness, light-headedness, numbness and headaches.  Psychiatric/Behavioral: Negative.     Physical Exam Updated Vital Signs BP 109/86 (BP Location: Right Arm)   Pulse (!) 105   Temp 98.8 F (37.1 C) (Oral)   Resp 18   Ht 5' 3.5" (1.613 m)   Wt 115.7 kg   LMP 12/31/2019   SpO2 100%   BMI 44.46 kg/m   Physical Exam Vitals and nursing note reviewed.  Constitutional:      Appearance: She is well-developed.  HENT:     Head: Normocephalic and atraumatic.     Mouth/Throat:     Mouth: Mucous membranes are moist.     Pharynx: Oropharynx is clear.  Eyes:     Conjunctiva/sclera: Conjunctivae normal.  Cardiovascular:     Rate and Rhythm: Regular rhythm. Tachycardia present.     Pulses: Normal pulses.     Heart sounds: Normal heart sounds.  Pulmonary:     Effort: Pulmonary effort is normal.     Breath sounds: Normal breath sounds. No wheezing.  Abdominal:     General: Bowel sounds are normal.     Palpations: Abdomen is soft.     Tenderness: There is no abdominal tenderness.  Musculoskeletal:        General: Normal range of motion.     Cervical back: Normal range of motion.  Skin:    General: Skin is warm and dry.     Capillary Refill: Capillary refill takes less than 2 seconds.  Neurological:     Mental Status: She is alert.     ED Results / Procedures / Treatments   Labs (all labs ordered are listed, but only abnormal results are displayed) Labs Reviewed  BASIC  METABOLIC PANEL - Abnormal; Notable for the following components:      Result Value   Glucose, Bld 104 (*)    All other components within normal limits  CBC - Abnormal; Notable for the following components:   Platelets 436 (*)    All other components within normal limits  HCG, QUANTITATIVE, PREGNANCY  TROPONIN I (HIGH SENSITIVITY)  TROPONIN I (HIGH SENSITIVITY)    EKG EKG Interpretation  Date/Time:  Tuesday December 31 2019 13:07:47 EDT Ventricular Rate:  136 PR Interval:  138 QRS Duration: 80 QT  Interval:  270 QTC Calculation: 406 R Axis:   63 Text Interpretation: Sinus tachycardia Nonspecific T wave abnormality Abnormal ECG Since last tracing rate faster Confirmed by Linwood Dibbles [phone]) on 12/31/2019 1:24:37 PM   Radiology CT Angio Chest PE W and/or Wo Contrast  Result Date: 12/31/2019 CLINICAL DATA:  Palpitations.  COVID-19. EXAM: CT ANGIOGRAPHY CHEST WITH CONTRAST TECHNIQUE: Multidetector CT imaging of the chest was performed using the standard protocol during bolus administration of intravenous contrast. Multiplanar CT image reconstructions and MIPs were obtained to evaluate the vascular anatomy. CONTRAST:  OMNIPAQUE IOHEXOL 350 MG/ML SOLN COMPARISON:  None. FINDINGS: Cardiovascular: Contrast injection is sufficient to demonstrate satisfactory opacification of the pulmonary arteries to the segmental level. There is no pulmonary embolus or evidence of right heart strain. The size of the main pulmonary artery is normal. Heart size is normal, with no pericardial effusion. The course and caliber of the aorta are normal. There is no atherosclerotic calcification. Opacification decreased due to pulmonary arterial phase contrast bolus timing. Mediastinum/Nodes: No mediastinal, hilar or axillary lymphadenopathy. Normal visualized thyroid. Thoracic esophageal course is normal. Lungs/Pleura: Airways are patent. No pleural effusion, lobar consolidation, pneumothorax or pulmonary  infarction. Upper Abdomen: Contrast bolus timing is not optimized for evaluation of the abdominal organs. The visualized portions of the organs of the upper abdomen are normal. Musculoskeletal: No chest wall abnormality. No bony spinal canal stenosis. Review of the MIP images confirms the above findings. IMPRESSION: No pulmonary embolus or other acute thoracic abnormality. Electronically Signed   By: Deatra Robinson M.D.   On: 12/31/2019 20:41    Procedures Procedures (including critical care time)  Medications Ordered in ED Medications  sodium chloride 0.9 % bolus 1,000 mL (0 mLs Intravenous Stopped 12/31/19 2122)  iohexol (OMNIPAQUE) 350 MG/ML injection 100 mL (100 mLs Intravenous Contrast Given 12/31/19 2017)    ED Course  I have reviewed the triage vital signs and the nursing notes.  Pertinent labs & imaging results that were available during my care of the patient were reviewed by me and considered in my medical decision making (see chart for details).    MDM Rules/Calculators/A&P                          Patient with new diagnosis of COVID-19 with a history of benign intermittent episodes of palpitations historically.  She has no shortness of breath or pleuritic pain but with her fast heart rate she was scanned to ensure no pulmonary embolism which was negative.  She was given IV fluids after which her pulse rate was improved.  She is in no distress during her ED stay.  No chest pain, troponins negative.  Also no evidence of Covid pneumonia.  She has taken Mucinex DM for the past several days, there is no pseudoephedrine in this product, so not felt secondary to medication side effect, although the NyQuil taken 2 days ago may have been the culprit, she was advised to not take this medication again but stick with the Mucinex DM.  Advised.  Follow-up with PCP For any persistent or worsening symptoms.  DELVINA MIZZELL was evaluated in Emergency Department on 01/02/2020 for the symptoms  described in the history of present illness. She was evaluated in the context of the global COVID-19 pandemic, which necessitated consideration that the patient might be at risk for infection with the SARS-CoV-2 virus that causes COVID-19. Institutional protocols and algorithms that pertain to the evaluation of patients  at risk for COVID-19 are in a state of rapid change based on information released by regulatory bodies including the CDC and federal and state organizations. These policies and algorithms were followed during the patient's care in the ED.  Final Clinical Impression(s) / ED Diagnoses Final diagnoses:  COVID-19  Palpitations    Rx / DC Orders ED Discharge Orders    None       Victoriano Laindol, Analeah Brame, PA-C 01/02/20 Leslye [id number]    Zackowski, Scott, MD 01/10/20 1550

## 2019-12-31 NOTE — ED Triage Notes (Addendum)
Pt c/o increased heart rate since last night. Pt reports palpitations, but denies chest pain. Pt was diagnosed with Covid on 12/25/19. Pt reports she has been seen by Cardiology in the past and had an EKG and Echo done due to tachycardia.

## 2020-01-04 ENCOUNTER — Ambulatory Visit
Admission: EM | Admit: 2020-01-04 | Discharge: 2020-01-04 | Disposition: A | Discharge status: Home / Self Care | Payer: PRIVATE HEALTH INSURANCE | Attending: Emergency Medicine | Admitting: Emergency Medicine

## 2020-01-04 ENCOUNTER — Ambulatory Visit (INDEPENDENT_AMBULATORY_CARE_PROVIDER_SITE_OTHER): Payer: PRIVATE HEALTH INSURANCE

## 2020-01-04 ENCOUNTER — Other Ambulatory Visit: Payer: Self-pay

## 2020-01-04 DIAGNOSIS — R0989 Other specified symptoms and signs involving the circulatory and respiratory systems: Secondary | ICD-10-CM | POA: Diagnosis not present

## 2020-01-04 MED ORDER — ALUM & MAG HYDROXIDE-SIMETH 200-200-20 MG/5ML PO SUSP
30.0000 mL | Freq: Once | ORAL | Status: AC
  Administered 2020-01-04: 30 mL via ORAL

## 2020-01-04 MED ORDER — LIDOCAINE VISCOUS HCL 2 % MT SOLN
15.0000 mL | Freq: Once | OROMUCOSAL | Status: AC
  Administered 2020-01-04: 15 mL via ORAL

## 2020-01-04 NOTE — ED Triage Notes (Signed)
Pt presents with c/o feeling like something is stuck in throat that began on wednesday night  Just completed quarantine for covid

## 2020-01-04 NOTE — Discharge Instructions (Signed)
X-ray negative for obvious retained FB We will trial a GI cocktail in office Push fluids and stick with a soft/ liquid diet Follow up with PCP as needed If symptoms persists or worsen please go to the ED.  You may need additional intervention Return or go to ER if patient has any new or worsening symptoms such as fever, chills, nausea, vomiting, sore throat, cough, abdominal pain, chest pain, changes in bowel or bladder habits, etc..Marland Kitchen

## 2020-01-04 NOTE — ED Provider Notes (Signed)
[facility]   [id number] 01/04/20 Arrival Time: 0850  CC: FB sensation in throat  SUBJECTIVE: History from: patient.  [name] is a 34 y.o. female who presents with abrupt onset of FB sensation in throat x 2 days.  Symptoms began after eating pork chops.  Has tried drinking water without relief.  Symptoms are made worse with swallowing, but tolerating liquids and own secretions without difficulty.  Reports previous symptoms in the past that improved without intervention.  Denies fever, chills, fatigue, cough, SOB, dyspnea, wheezing, chest pain, nausea, vomiting, changes in bowel or bladder function.    ROS: As per HPI.  All other pertinent ROS negative.     Past Medical History:  Diagnosis Date   Abnormal uterine bleeding (AUB) 01/19/2015   Complication of anesthesia    Depression    Family history of adverse reaction to anesthesia    Fibrocystic breast changes    Hypertension    Hyperthyroidism    Migraines    Nexplanon in place 01/19/2015   Nexplanon insertion 11/29/2012   inserted nexplanon 11/29/12 in left arm, remove 11/30/15   Obesity    Preterm delivery    Seasonal rhinitis    UTI (lower urinary tract infection)    Past Surgical History:  Procedure Laterality Date   CHOLECYSTECTOMY     ORIF TIBIA PLATEAU Left 07/01/2015   Procedure: OPEN REDUCTION INTERNAL FIXATION (ORIF) TIBIAL PLATEAU AND BONE GRAFT LEFT TIBIAL PLATEAU;  Surgeon: [name], MD;  Location: [facility];  Service: Orthopedics;  Laterality: Left;   TONSILLECTOMY     Allergies  Allergen Reactions   Hydrocodone    Codeine Itching and Other (See Comments)    Does not like the way it makes her feel.   Dilaudid [Hydromorphone Hcl] Hives and Itching   Hydromorphone    Iodine Itching   Shellfish Allergy Rash   No current facility-administered medications on file prior to encounter.   Current Outpatient Medications on File Prior to Encounter  Medication Sig  Dispense Refill   albuterol (PROVENTIL HFA;VENTOLIN HFA) 108 (90 Base) MCG/ACT inhaler Inhale 2 puffs into the lungs every 6 (six) hours as needed for wheezing or shortness of breath.     cetirizine (ZYRTEC) 10 MG tablet Take 10 mg by mouth daily as needed for allergies.     etonogestrel (NEXPLANON) 68 MG IMPL implant Inject 1 each into the skin once.     Social History   Socioeconomic History   Marital status: Married    Spouse name: Not on file   Number of children: 2   Years of education: Not on file   Highest education level: Not on file  Occupational History   Not on file  Tobacco Use   Smoking status: Never Smoker   Smokeless tobacco: Never Used  Vaping Use   Vaping Use: Never used  Substance and Sexual Activity   Alcohol use: No   Drug use: No   Sexual activity: Yes    Birth control/protection: Implant  Other Topics Concern   Not on file  Social History Narrative   Not on file   Social Determinants of Health   Financial Resource Strain:    Difficulty of Paying Living Expenses: Not on file  Food Insecurity:    Worried About Running Out of Food in the Last Year: Not on file   [employer] of Food in the Last Year: Not on file  Transportation Needs:    Lack of Transportation (Medical): Not  on file   Lack of Transportation (Non-Medical): Not on file  Physical Activity:    Days of Exercise per Week: Not on file   Minutes of Exercise per Session: Not on file  Stress:    Feeling of Stress : Not on file  Social Connections:    Frequency of Communication with Friends and Family: Not on file   Frequency of Social Gatherings with Friends and Family: Not on file   Attends Religious Services: Not on file   Active Member of Clubs or Organizations: Not on file   Attends Banker Meetings: Not on file   Marital Status: Not on file  Intimate Partner Violence:    Fear of Current or Ex-Partner: Not on file   Emotionally Abused: Not on  file   Physically Abused: Not on file   Sexually Abused: Not on file   Family History  Problem Relation Age of Onset   Hypertension Mother    Hypertension Father    Hypertension Maternal Grandmother    Hypertension Maternal Grandfather    Diabetes Paternal Grandmother    Hypertension Paternal Grandmother    Heart disease Paternal Grandfather    Diabetes Paternal Grandfather    Hypertension Paternal Grandfather     OBJECTIVE:  Vitals:   01/04/20 0903  BP: 124/84  Pulse: 89  Resp: 20  Temp: 97.8 F (36.6 C)  SpO2: 97%     General appearance: alert; well-appearing, nontoxic; speaking in full sentences and tolerating own secretions HEENT: NCAT; Ears: EACs clear; Eyes: PERRL.  EOM grossly intact.Nose: nares patent without rhinorrhea, Throat: oropharynx clear, tonsils non erythematous or enlarged, uvula midline  Neck: supple without LAD Chest wall: NTTP over sternum, no crepitus  Lungs: unlabored respirations, symmetrical air entry; cough: absent; no respiratory distress; CTAB Heart: regular rate and rhythm.  Skin: warm and dry Psychological: alert and cooperative; normal mood and affect   DIAGNOSTIC STUDIES: DG Neck Soft Tissue  Result Date: 01/04/2020 CLINICAL DATA:  Foreign bodies sensation. EXAM: NECK SOFT TISSUES - 1+ VIEW COMPARISON:  None. FINDINGS: There is no evidence of retropharyngeal soft tissue swelling or epiglottic enlargement. The cervical airway is unremarkable and no radio-opaque foreign body identified. IMPRESSION: Negative. Electronically Signed   By: Lupita Raider M.D.   On: 01/04/2020 09:51    X-rays negative for FB  I have reviewed the x-rays myself and the radiologist interpretation. I am in agreement with the radiologist interpretation.     ASSESSMENT & PLAN:  1. Foreign body sensation in throat     Meds ordered this encounter  Medications   AND Linked Order Group    alum & mag hydroxide-simeth (MAALOX/MYLANTA) 200-200-20 MG/5ML  suspension 30 mL    lidocaine (XYLOCAINE) 2 % viscous mouth solution 15 mL    X-ray negative for obvious retained FB We will trial a GI cocktail in office Push fluids and stick with a soft/ liquid diet Follow up with PCP as needed If symptoms persists or worsen please go to the ED.  You may need additional intervention Return or go to ER if patient has any new or worsening symptoms such as fever, chills, nausea, vomiting, sore throat, cough, abdominal pain, chest pain, changes in bowel or bladder habits, etc...  Reviewed expectations re: course of current medical issues. Questions answered. Outlined signs and symptoms indicating need for more acute intervention. Patient verbalized understanding. After Visit Summary given.        Alvino Chapel [place], PA-C 01/04/20 [phone]

## 2020-01-15 ENCOUNTER — Telehealth: Payer: Self-pay | Admitting: Cardiology

## 2020-01-15 NOTE — Telephone Encounter (Signed)
Received a phone call from 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   This patient is trying to get life ins and they're needing something signed by a provider stating that she does not need a sleep evaluation since she had an Echo done and they didn't see anything wrong with the study.   They have received all of her medical records, but they're needing something from a Dr. Donnella Sham the above before they will continue.   Please call Tania with any questions [phone]  Fax # [phone] or [email or web address]

## 2020-01-15 NOTE — Telephone Encounter (Signed)
Would be better addressed by pcp. Former patient of Dr Kirtland Bouchard. A normal echo would not exclude the need for a sleep study, from Dr Charm Rings notes she had symptoms suggestive of sleep apnea which is the more important thing when considering a sleep study. With our back log of appointments and this being a noncardiac question likely quickest resolution for her would be to discuss this with her pcp.    Dina Rich MD

## 2020-01-15 NOTE — Telephone Encounter (Signed)
I will defer to Dr.Branch °

## 2020-01-15 NOTE — Telephone Encounter (Signed)
I  Faxed Dr.Branch's response to 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

## 2020-03-13 ENCOUNTER — Telehealth: Payer: Self-pay | Admitting: Advanced Practice Midwife

## 2020-03-13 NOTE — Telephone Encounter (Signed)
Patient called and wanted to know if a prescription could be called in to stop clotting and patient also stated that she has the Nexplanon inserted. Per Patient.. Clinical staff will follow up with patient.

## 2020-03-13 NOTE — Telephone Encounter (Signed)
Pt has a nexplanon and has had bleeding often. Today it has been heavy with clots. She doesn't want meds just wanted to make sure she didn't need to worry. Advised that if bleeding lasted longer than her normal period it was ok to call us back for meds. Patient agrees and wants to wait for now.

## 2020-05-08 ENCOUNTER — Other Ambulatory Visit: Payer: Self-pay

## 2020-05-08 ENCOUNTER — Encounter: Payer: Self-pay | Admitting: Adult Health

## 2020-05-08 ENCOUNTER — Ambulatory Visit (INDEPENDENT_AMBULATORY_CARE_PROVIDER_SITE_OTHER): Payer: PRIVATE HEALTH INSURANCE | Admitting: Adult Health

## 2020-05-08 VITALS — BP 122/71 | HR 90 | Ht 63.5 in | Wt 270.0 lb

## 2020-05-08 DIAGNOSIS — N644 Mastodynia: Secondary | ICD-10-CM | POA: Diagnosis not present

## 2020-05-08 DIAGNOSIS — N6452 Nipple discharge: Secondary | ICD-10-CM

## 2020-05-08 MED ORDER — NYSTATIN-TRIAMCINOLONE 100000-0.1 UNIT/GM-% EX OINT: 1.0000 "application " | TOPICAL_OINTMENT | Freq: Two times a day (BID) | CUTANEOUS | 0 refills | Status: DC

## 2020-05-08 NOTE — Progress Notes (Signed)
  Subjective:     Patient ID: [name], female   DOB: 1986-02-21, 35 y.o.   MRN: [id number]  HPI [name] is a 35 year old white female, married, [id number],LN complaining of left nipple discharge on and off for like 2 years has normal TSH and prolactin in 2020. Has pain shoot through left beat at times and nipple itches. She works for [employer], Research scientist (life sciences).  PCP is Roma Kayser PA  Review of Systems Has left nipple discharge at times  +pain in left breast at times Left nipple itches and has dry patch     Objective:   Physical Exam BP 122/71 (BP Location: Left Arm, Patient Position: Sitting, Cuff Size: Large)   Pulse 90   Ht 5' 3.5" (1.613 m)   Wt 270 lb (122.5 kg)   BMI 47.08 kg/m   Skin warm and dry,  Breasts:no dominate palpable mass, retraction or nipple discharge, on left breast at edge of areola at 12 0' clock,is a red,round 1 cm dry patch of skin. Fall risk is low  Upstream - 05/08/20 1212      Pregnancy Intention Screening   Does the patient want to become pregnant in the next year? No    Does the patient's partner want to become pregnant in the next year? No    Would the patient like to discuss contraceptive options today? No      Contraception Wrap Up   Current Method Hormonal Implant    End Method Hormonal Implant    Contraception Counseling Provided No             Assessment:     1. Discharge from left nipple,at times and it itches Will get diagnostic bilateral mammogram and left and right [place] if needed, scheduled at [facility] 06/02/20 at 2:40 pm I gave her a prescription for mytrex to use on left breast at itchy spot Meds ordered this encounter  Medications  . nystatin-triamcinolone ointment (MYCOLOG)    Sig: Apply 1 application topically 2 (two) times daily.    Dispense:  30 g    Refill:  0    Order Specific Question:   Supervising Provider    Answer:   Despina Hidden, LUTHER H [[id number]]     2. Breast pain, left Will get diagnostic mammogram and [place]    Plan:      Follow up in 1 month for breast recheck with me

## 2020-06-02 ENCOUNTER — Ambulatory Visit (HOSPITAL_COMMUNITY)
Admission: RE | Admit: 2020-06-02 | Discharge: 2020-06-02 | Disposition: A | Discharge status: Home / Self Care | Payer: PRIVATE HEALTH INSURANCE | Source: Ambulatory Visit | Attending: Adult Health | Admitting: Adult Health

## 2020-06-02 ENCOUNTER — Other Ambulatory Visit: Payer: Self-pay

## 2020-06-02 DIAGNOSIS — N644 Mastodynia: Secondary | ICD-10-CM | POA: Insufficient documentation

## 2020-06-02 DIAGNOSIS — N6452 Nipple discharge: Secondary | ICD-10-CM | POA: Diagnosis present

## 2020-06-05 ENCOUNTER — Ambulatory Visit: Payer: PRIVATE HEALTH INSURANCE | Admitting: Adult Health

## 2020-09-29 ENCOUNTER — Telehealth: Payer: Self-pay | Admitting: Adult Health

## 2020-09-29 NOTE — Telephone Encounter (Signed)
Patient calling stating states she has the nexplanon and states that she is having some crazy on and off again bleeding. States that someone gave her something before to help control her bleeding wants to know if something else could be sent to pharmacy [facility].

## 2020-09-30 MED ORDER — MEGESTROL ACETATE 40 MG PO TABS: ORAL_TABLET | ORAL | 1 refills | Status: DC

## 2020-09-30 NOTE — Addendum Note (Signed)
Addended by: Cyril Mourning A on: 09/30/2020 01:32 PM   Modules accepted: Orders

## 2020-09-30 NOTE — Telephone Encounter (Signed)
[name] is a aware that megace sent to [facility]

## 2020-11-03 NOTE — Progress Notes (Signed)
CARDIOLOGY CONSULT NOTE       Patient ID: [name] MRN: [id number] DOB/AGE: 1985/10/24 35 y.o.  Admit date: (Not on file) Referring Physician: Vernice Jefferson Primary Physician: Royann Shivers, PA-C Primary Cardiologist: New Reason for Consultation: Chest Pain/Palpitations  Active Problems:   * No active hospital problems. *   HPI:  35 y.o. 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 at home and 2 kids Otherwise sedentary   ROS All other systems reviewed and negative except as noted above  Past Medical History:  Diagnosis Date   Abnormal uterine bleeding (AUB) 01/19/2015   Complication of anesthesia    Depression    Family history of adverse reaction to anesthesia    Fibrocystic breast changes    Hypertension    Hyperthyroidism    Migraines    Nexplanon in place 01/19/2015   Nexplanon insertion 11/29/2012   inserted nexplanon 11/29/12 in left arm, remove 11/30/15   Obesity    Preterm delivery    Seasonal rhinitis    UTI (lower urinary tract infection)     Family History  Problem Relation Age of Onset   Hypertension Mother    Hypertension Father    Hypertension Maternal Grandmother    Hypertension Maternal Grandfather    Diabetes Paternal Grandmother    Hypertension Paternal Grandmother    Heart disease Paternal Grandfather    Diabetes Paternal Grandfather    Hypertension Paternal Grandfather     Social History   Socioeconomic History    Marital status: Married    Spouse name: Not on file   Number of children: 2   Years of education: Not on file   Highest education level: Not on file  Occupational History   Not on file  Tobacco Use   Smoking status: Never   Smokeless tobacco: Never  Vaping Use   Vaping Use: Never used  Substance and Sexual Activity   Alcohol use: No   Drug use: No   Sexual activity: Yes    Birth control/protection: Implant  Other Topics Concern   Not on file  Social History Narrative   Not on file   Social Determinants of Health   Financial Resource Strain: Not on file  Food Insecurity: Not on file  Transportation Needs: Not on file  Physical Activity: Not on file  Stress: Not on file  Social Connections: Not on file  Intimate Partner Violence: Not on file    Past Surgical History:  Procedure Laterality Date   CHOLECYSTECTOMY     ORIF TIBIA PLATEAU Left 07/01/2015   Procedure: OPEN REDUCTION INTERNAL FIXATION (ORIF) TIBIAL PLATEAU AND BONE GRAFT LEFT TIBIAL PLATEAU;  Surgeon: [name], MD;  Location: [facility];  Service: Orthopedics;  Laterality: Left;   TONSILLECTOMY        Current Outpatient Medications:    albuterol (PROVENTIL HFA;VENTOLIN HFA) 108 (90 Base) MCG/ACT inhaler, Inhale 2 puffs into the lungs every 6 (six) hours as needed  for wheezing or shortness of breath., Disp: , Rfl:    Carboxymethylcell-Glycerin PF (REFRESH RELIEVA PF) 0.5-1 % SOLN, Apply to eye 4 (four) times daily. Pt doesn't know the dosage, Disp: , Rfl:    cetirizine (ZYRTEC) 10 MG tablet, Take 10 mg by mouth daily as needed for allergies., Disp: , Rfl:    etonogestrel (NEXPLANON) 68 MG IMPL implant, Inject 1 each into the skin once., Disp: , Rfl:    Varenicline Tartrate (TYRVAYA) 0.03 MG/ACT SOLN, Place into the nose 2 (two) times daily. Pt doesn't know the dosage. Using twice daily, Disp: , Rfl:     Physical Exam: Blood pressure 112/64, pulse 80, resp. rate 18, height 5\' 3"  (1.6 m), weight 118.8 kg,  last menstrual period 10/25/2020, SpO2 98 %.    Affect appropriate Overweight  HEENT: normal Neck supple with no adenopathy JVP normal no bruits no thyromegaly Lungs clear with no wheezing and good diaphragmatic motion Heart:  S1/S2 no murmur, no rub, gallop or click PMI normal Abdomen: benighn, BS positve, no tenderness, no AAA no bruit.  No HSM or HJR Distal pulses intact with no bruits No edema Neuro non-focal Skin warm and dry No muscular weakness   Labs:   Lab Results  Component Value Date   WBC 8.6 12/31/2019   HGB 14.9 12/31/2019   HCT 44.8 12/31/2019   MCV 90.7 12/31/2019   PLT 436 (H) 12/31/2019   No results for input(s): NA, K, CL, CO2, BUN, CREATININE, CALCIUM, PROT, BILITOT, ALKPHOS, ALT, AST, GLUCOSE in the last 168 hours.  Invalid input(s): LABALBU No results found for: CKTOTAL, CKMB, CKMBINDEX, TROPONINI  Lab Results  Component Value Date   CHOL 130 05/09/2007   Lab Results  Component Value Date   HDL 43 05/09/2007   Lab Results  Component Value Date   LDLCALC 74 05/09/2007   Lab Results  Component Value Date   TRIG 64 05/09/2007   Lab Results  Component Value Date   CHOLHDL 3.0 Ratio 05/09/2007   No results found for: LDLDIRECT    Radiology: No results found.  EKG: see HPI   ASSESSMENT AND PLAN:   Chest Pain:  atypical normal ECG shared decision making felt cardiac CT best option Describes itching/rash With iodine / shell fish should not need to be premedicated Check BHCG lopressor 100 mg 2 hours before  Palpitations/Tachycardia:  ECG with sinus obese and deonditioned 30 day monitor  HTN:  Well controlled.  Continue current medications and low sodium Dash type diet.   Asthma:  has Proventil inhaler no active wheezing   30 day event monitor  Cardiac CTA   Signed: 07/07/2007 11/10/2020, 2:52 PM

## 2020-11-10 ENCOUNTER — Ambulatory Visit (INDEPENDENT_AMBULATORY_CARE_PROVIDER_SITE_OTHER): Payer: No Typology Code available for payment source | Admitting: Cardiovascular Disease

## 2020-11-10 ENCOUNTER — Other Ambulatory Visit: Payer: Self-pay

## 2020-11-10 ENCOUNTER — Encounter: Payer: Self-pay | Admitting: Cardiovascular Disease

## 2020-11-10 VITALS — BP 112/64 | HR 80 | Resp 18 | Ht 63.0 in | Wt 261.8 lb

## 2020-11-10 DIAGNOSIS — R079 Chest pain, unspecified: Secondary | ICD-10-CM

## 2020-11-10 DIAGNOSIS — R002 Palpitations: Secondary | ICD-10-CM

## 2020-11-10 DIAGNOSIS — Z01818 Encounter for other preprocedural examination: Secondary | ICD-10-CM | POA: Diagnosis not present

## 2020-11-10 MED ORDER — METOPROLOL TARTRATE 100 MG PO TABS: ORAL_TABLET | ORAL | 0 refills | Status: DC

## 2020-11-10 NOTE — Patient Instructions (Addendum)
Medication Instructions:  Your physician recommends that you continue on your current medications as directed. Please refer to the Current Medication list given to you today.   Pick up Lopressor 100 mg at pharmacy. Take 2 hours before CT  *If you need a refill on your cardiac medications before your next appointment, please call your pharmacy*   Lab Work: Urine HCG  If you have labs (blood work) drawn today and your tests are completely normal, you will receive your results only by: MyChart Message (if you have MyChart) OR A paper copy in the mail If you have any lab test that is abnormal or we need to change your treatment, we will call you to review the results.   Testing/Procedures:  Get urine HCG at [facility] outpatient lab   Please schedule Cardiac CT, instructions printed for you   Your physician has recommended that you wear an event monitor 30 day PREVENTICE. Event monitors are medical devices that record the heart's electrical activity. Doctors most often [place] these monitors to diagnose arrhythmias. Arrhythmias are problems with the speed or rhythm of the heartbeat. The monitor is a small, portable device. You can wear one while you do your normal daily activities. This is usually used to diagnose what is causing palpitations/syncope (passing out).     Follow-Up: At [facility], you and your health needs are our priority.  As part of our continuing mission to provide you with exceptional heart care, we have created designated Provider Care Teams.  These Care Teams include your primary Cardiologist (physician) and Advanced Practice Providers (APPs -  Physician Assistants and Nurse Practitioners) who all work together to provide you with the care you need, when you need it.  We recommend signing up for the patient portal called "MyChart".  Sign up information is provided on this After Visit Summary.  MyChart is used to connect with patients for Virtual Visits  (Telemedicine).  Patients are able to view lab/test results, encounter notes, upcoming appointments, etc.  Non-urgent messages can be sent to your provider as well.   To learn more about what you can do with MyChart, go to [email or web address].    Your next appointment:  As needed

## 2020-11-17 ENCOUNTER — Ambulatory Visit (INDEPENDENT_AMBULATORY_CARE_PROVIDER_SITE_OTHER): Payer: No Typology Code available for payment source

## 2020-11-17 ENCOUNTER — Other Ambulatory Visit: Payer: Self-pay

## 2020-11-17 DIAGNOSIS — R002 Palpitations: Secondary | ICD-10-CM | POA: Diagnosis not present

## 2020-11-20 ENCOUNTER — Telehealth (HOSPITAL_COMMUNITY): Payer: Self-pay | Admitting: *Deleted

## 2020-11-20 NOTE — Telephone Encounter (Signed)
Reaching out to patient to offer assistance regarding upcoming cardiac imaging study; pt verbalizes understanding of appt date/time but wishes to be rescheduled to September.  Patient schedule for 7:45am on December 17, 2020.  She is aware we will call her closer to that date to review CCTA instructions.  Larey Brick RN Navigator Cardiac Imaging [facility] [phone] office [phone] cell

## 2020-11-24 ENCOUNTER — Ambulatory Visit (HOSPITAL_COMMUNITY): Admission: RE | Admit: 2020-11-24 | Payer: No Typology Code available for payment source | Source: Ambulatory Visit

## 2020-12-15 ENCOUNTER — Telehealth (HOSPITAL_COMMUNITY): Payer: Self-pay | Admitting: Emergency Medicine

## 2020-12-15 NOTE — Telephone Encounter (Signed)
Attempted to call patient regarding upcoming cardiac CT appointment. °Left message on voicemail with name and callback number °Vernona Peake RN Navigator Cardiac Imaging ° [facility] °[phone] Office °[phone] Cell ° °

## 2020-12-17 ENCOUNTER — Ambulatory Visit (HOSPITAL_COMMUNITY): Admission: RE | Admit: 2020-12-17 | Payer: No Typology Code available for payment source | Source: Ambulatory Visit

## 2021-01-26 ENCOUNTER — Other Ambulatory Visit (HOSPITAL_COMMUNITY): Payer: Self-pay | Admitting: Physician Assistant

## 2021-01-26 ENCOUNTER — Other Ambulatory Visit: Payer: Self-pay | Admitting: Physician Assistant

## 2021-01-26 DIAGNOSIS — G4459 Other complicated headache syndrome: Secondary | ICD-10-CM

## 2021-01-29 ENCOUNTER — Other Ambulatory Visit: Payer: Self-pay

## 2021-01-29 ENCOUNTER — Ambulatory Visit (HOSPITAL_COMMUNITY)
Admission: RE | Admit: 2021-01-29 | Discharge: 2021-01-29 | Disposition: A | Discharge status: Home / Self Care | Payer: PRIVATE HEALTH INSURANCE | Source: Ambulatory Visit | Attending: Physician Assistant | Admitting: Physician Assistant

## 2021-01-29 DIAGNOSIS — G4459 Other complicated headache syndrome: Secondary | ICD-10-CM | POA: Diagnosis not present

## 2021-02-05 ENCOUNTER — Encounter: Payer: Self-pay | Admitting: Neurology

## 2021-02-05 ENCOUNTER — Ambulatory Visit (INDEPENDENT_AMBULATORY_CARE_PROVIDER_SITE_OTHER): Payer: No Typology Code available for payment source | Admitting: Neurology

## 2021-02-05 VITALS — BP 132/79 | HR 97 | Ht 63.0 in | Wt 251.0 lb

## 2021-02-05 DIAGNOSIS — G932 Benign intracranial hypertension: Secondary | ICD-10-CM

## 2021-02-05 MED ORDER — ACETAZOLAMIDE ER 500 MG PO CP12
500.0000 mg | ORAL_CAPSULE | Freq: Two times a day (BID) | ORAL | 3 refills | Status: DC
Start: 2021-02-05 — End: 2021-11-08

## 2021-02-05 NOTE — Progress Notes (Signed)
[facility]  PATIENT: [name] DOB: 1986/03/23  REFERRING CLINICIAN: Royann Shivers, * HISTORY FROM: Patient and husband  REASON FOR VISIT: Headaches    HISTORICAL  CHIEF COMPLAINT:  Chief Complaint  Patient presents with   New Patient (Initial Visit)    Rm 13, with husband, here to discuss MIR , Reports headache x 2 weeks, takes otc Ibuprofen but headaches always reoccurs     HISTORY OF PRESENT ILLNESS:  This is a 35 year old woman with no reported past medical history other than obesity who is presenting with complaint of headaches.  Patient states that she has been having headache for the past 25-months but worse in the past 3 weeks.  Reported in the past 3 weeks the headache has been constant, wake up with headaches, go to sleep with the headaches. During this time also, she has no energy, feels weak and nothing seems to help with the headaches.  She has tried Tylenol with no real relief.  Patient stated a year ago she has been having vision problem, described as blurry vision, visual obscuration when going from sitting to standing position.  She had seen 3 ophthalmologists the latest one did a Brain MRI and refer her to neurology because he did not see any primary ophthalmological abnormality.  Patient denies any family history of migraines or headaches. Headaches are described as bitemporal throbbing pain.    Headache History and Characteristics: Onset: 3 months but constant in the past 3 weeks  Location: temples  Quality:  throbbing  Intensity: 6-7/10.  Duration Constant for the past 3 weeks  Migrainous Features: nausea,.  Aura: No  History of brain injury or tumor: No  Family history: None  Motion sickness: no Cardiac history: no  OTC: tylenol, codeine Caffeine: Yes,coffee in the AM  Sleep: good, but does snore  Mood/ Stress: good.  Prior prophylaxis: Propranolol: No  Verapamil:No TCA: No Topamax: No Depakote: No Effexor:  No Cymbalta: No Neurontin:No  Prior abortives: Triptan: No Anti-emetic: No Steroids: No Ergotamine suppository: No    OTHER MEDICAL CONDITIONS: None reported    REVIEW OF SYSTEMS: Full 14 system review of systems performed and negative with exception of: as noted in the HPI   ALLERGIES: Allergies  Allergen Reactions   Hydrocodone    Codeine Itching and Other (See Comments)    Does not like the way it makes her feel.   Dilaudid [Hydromorphone Hcl] Hives and Itching   Hydromorphone    Iodine Itching   Shellfish Allergy Rash    HOME MEDICATIONS: Outpatient Medications Prior to Visit  Medication Sig Dispense Refill   Carboxymethylcell-Glycerin PF (REFRESH RELIEVA PF) 0.5-1 % SOLN Apply to eye 4 (four) times daily. Pt doesn't know the dosage     etonogestrel (NEXPLANON) 68 MG IMPL implant Inject 1 each into the skin once.     albuterol (PROVENTIL HFA;VENTOLIN HFA) 108 (90 Base) MCG/ACT inhaler Inhale 2 puffs into the lungs every 6 (six) hours as needed for wheezing or shortness of breath.     cetirizine (ZYRTEC) 10 MG tablet Take 10 mg by mouth daily as needed for allergies.     metoprolol tartrate (LOPRESSOR) 100 MG tablet Take 1 tablet (100 mg ) 2 hrs before CT 1 tablet 0   Varenicline Tartrate (TYRVAYA) 0.03 MG/ACT SOLN Place into the nose 2 (two) times daily. Pt doesn't know the dosage. Using twice daily     No facility-administered medications prior to visit.    PAST MEDICAL  HISTORY: Past Medical History:  Diagnosis Date   Abnormal uterine bleeding (AUB) 01/19/2015   Complication of anesthesia    Depression    Family history of adverse reaction to anesthesia    Fibrocystic breast changes    Hypertension    Hyperthyroidism    Migraines    Nexplanon in place 01/19/2015   Nexplanon insertion 11/29/2012   inserted nexplanon 11/29/12 in left arm, remove 11/30/15   Obesity    Preterm delivery    Seasonal rhinitis    UTI (lower urinary tract infection)     PAST  SURGICAL HISTORY: Past Surgical History:  Procedure Laterality Date   CHOLECYSTECTOMY     ORIF TIBIA PLATEAU Left 07/01/2015   Procedure: OPEN REDUCTION INTERNAL FIXATION (ORIF) TIBIAL PLATEAU AND BONE GRAFT LEFT TIBIAL PLATEAU;  Surgeon: [name], MD;  Location: [facility];  Service: Orthopedics;  Laterality: Left;   TONSILLECTOMY      FAMILY HISTORY: Family History  Problem Relation Age of Onset   Hypertension Mother    Hypertension Father    Hypertension Maternal Grandmother    Hypertension Maternal Grandfather    Diabetes Paternal Grandmother    Hypertension Paternal Grandmother    Heart disease Paternal Grandfather    Diabetes Paternal Grandfather    Hypertension Paternal Grandfather     SOCIAL HISTORY: Social History   Socioeconomic History   Marital status: Married    Spouse name: Not on file   Number of children: 2   Years of education: Not on file   Highest education level: Not on file  Occupational History   Not on file  Tobacco Use   Smoking status: Never   Smokeless tobacco: Never  Vaping Use   Vaping Use: Never used  Substance and Sexual Activity   Alcohol use: No   Drug use: No   Sexual activity: Yes    Birth control/protection: Implant  Other Topics Concern   Not on file  Social History Narrative   Not on file   Social Determinants of Health   Financial Resource Strain: Not on file  Food Insecurity: Not on file  Transportation Needs: Not on file  Physical Activity: Not on file  Stress: Not on file  Social Connections: Not on file  Intimate Partner Violence: Not on file     PHYSICAL EXAM  GENERAL EXAM/CONSTITUTIONAL: Vitals:  Vitals:   02/05/21 0853  BP: 132/79  Pulse: 97  Weight: 251 lb (113.9 kg)  Height: 5\' 3"  (1.6 m)   Body mass index is 44.46 kg/m. Wt Readings from Last 3 Encounters:  02/05/21 251 lb (113.9 kg)  11/10/20 261 lb 12.8 oz (118.8 kg)  05/08/20 270 lb (122.5 kg)   Patient is in no distress; well  developed, nourished and groomed; neck is supple  EYES: Pupils round and reactive to light, Visual fields full to confrontation, Extraocular movements intacts,   MUSCULOSKELETAL: Gait, strength, tone, movements noted in Neurologic exam below  NEUROLOGIC: MENTAL STATUS:  No flowsheet data found. awake, alert, oriented to person, place and time recent and remote memory intact normal attention and concentration language fluent, comprehension intact, naming intact fund of knowledge appropriate  CRANIAL NERVE:  2nd - no papilledema or hemorrhages on fundoscopic exam 2nd, 3rd, 4th, 6th - pupils equal and reactive to light, visual fields full to confrontation, extraocular muscles intact, no nystagmus 5th - facial sensation symmetric 7th - facial strength symmetric 8th - hearing intact 9th - palate elevates symmetrically, uvula midline 11th - shoulder  shrug symmetric 12th - tongue protrusion midline  MOTOR:  normal bulk and tone, full strength in the BUE, BLE  SENSORY:  normal and symmetric to light touch, pinprick, temperature, vibration  COORDINATION:  finger-nose-finger, fine finger movements normal  REFLEXES:  deep tendon reflexes present and symmetric  GAIT/STATION:  normal   DIAGNOSTIC DATA (LABS, IMAGING, TESTING) - I reviewed patient records, labs, notes, testing and imaging myself where available.  Lab Results  Component Value Date   WBC 8.6 12/31/2019   HGB 14.9 12/31/2019   HCT 44.8 12/31/2019   MCV 90.7 12/31/2019   PLT 436 (H) 12/31/2019      Component Value Date/Time   NA 139 12/31/2019 1532   K 3.7 12/31/2019 1532   CL 104 12/31/2019 1532   CO2 23 12/31/2019 1532   GLUCOSE 104 (H) 12/31/2019 1532   BUN 9 12/31/2019 1532   CREATININE 0.67 12/31/2019 1532   CALCIUM 9.7 12/31/2019 1532   PROT 8.6 (H) 01/11/2019 1407   ALBUMIN 4.5 01/11/2019 1407   AST 25 01/11/2019 1407   ALT 29 01/11/2019 1407   ALKPHOS 64 01/11/2019 1407   BILITOT 0.5  01/11/2019 1407   GFRNONAA >60 12/31/2019 1532   GFRAA >60 12/31/2019 1532   Lab Results  Component Value Date   CHOL 130 05/09/2007   HDL 43 05/09/2007   LDLCALC 74 05/09/2007   TRIG 64 05/09/2007   CHOLHDL 3.0 Ratio 05/09/2007   No results found for: HGBA1C No results found for: VITAMINB12 Lab Results  Component Value Date   TSH 1.592 01/11/2019     MRI Brain without contrast  Artifact obscures portions of the posterior fossa on several sequences. Partially empty sella turcica. While this finding often reflects incidental anatomic variation, it can also be associated with idiopathic intracranial hypertension (pseudotumor cerebri). 5-6 mm ovoid focus of signal abnormality within the right frontal lobe periventricular white matter, as described. This finding is nonspecific, but could potentially reflect a small focus of nodular gray matter heterotopia or sequela of a nonspecific remote insult. Within described limitations, there is an otherwise unremarkable non-contrast MRI appearance of the brain. Paranasal sinus disease, as described.    ASSESSMENT AND PLAN  35 y.o. year old female with past medical history of obesity who is presenting with complaint of headaches for the past 3 months and visual changes for the past year.  She reported in the past 3 weeks headache has been constant, goes to sleep with a headache and wakes up with headaches, described them as bitemporal throbbing pain.  She does also report visual obscuration and intermittent blurry vision.  Seen ophthalmologist and was cleared for any primary ophthalmological  problem.  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  I will start her on Diamox 500 mg twice daily and continue to monitor.  If headaches not improved with an increase the Diamox to 3 times daily and also add topiramate.  Follow up in 3 months    1. Idiopathic intracranial  hypertension     PLAN: Start with Diamox 500 mg twice daily  Return in 3 months for follow up   No orders of the defined types were placed in this encounter.   Meds ordered this encounter  Medications   acetaZOLAMIDE ER (DIAMOX) 500 MG capsule    Sig: Take 1 capsule (500 mg total) by mouth 2 (two) times daily.    Dispense:  180 capsule  Refill:  3     Return in about 3 months (around 05/08/2021).    Windell Norfolk, MD 02/05/2021, 9:57 AM  [facility] [address], [address] [place], [place] [address] [phone]

## 2021-02-05 NOTE — Patient Instructions (Signed)
Start with Diamox 500 mg twice daily  Return in 3 months for follow up

## 2021-02-08 ENCOUNTER — Telehealth: Payer: Self-pay | Admitting: Neurology

## 2021-02-08 NOTE — Telephone Encounter (Signed)
She started taking Diamox 500mg , one capsule BID, on 02/06/21. The following morning she noticed intermittent tingling in her hands, feet and mouth. The symptoms have progressed to being present more often than not. 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  Additionally, she has specific questions about her recent MRI brain and would like the physician to call her for detailed, clarification of results.

## 2021-02-08 NOTE — Telephone Encounter (Signed)
Pt called thinks she is having a reaction to the acetaZOLAMIDE ER (DIAMOX) 500 MG capsule. She states her hands and feet are swollen. Pt requesting a call back.

## 2021-02-08 NOTE — Telephone Encounter (Signed)
Spoke with patient, states that she is doing much better after starting the Diamox but does have intermittent tingling sensation. Plan to continue medications, and if symptoms get worse to contact us.   We also discussed about the MRI finding: 5-6 mm ovoid focus of signal abnormality within the right frontal lobe periventricular white matter, as described. This finding is nonspecific, but could potentially reflect a small focus of nodular gray matter heterotopia or sequela of a nonspecific remote insult. She is worried about MS, informed her that we can repeat the MRI to make sure we don't have any new abnormalities. Follow up as scheduled in 3 months.

## 2021-02-12 ENCOUNTER — Other Ambulatory Visit: Payer: Self-pay

## 2021-02-12 ENCOUNTER — Encounter: Payer: Self-pay | Admitting: Adult Health

## 2021-02-12 ENCOUNTER — Other Ambulatory Visit (HOSPITAL_COMMUNITY)
Admission: RE | Admit: 2021-02-12 | Discharge: 2021-02-12 | Disposition: A | Discharge status: Home / Self Care | Payer: No Typology Code available for payment source | Source: Ambulatory Visit | Attending: Adult Health | Admitting: Adult Health

## 2021-02-12 ENCOUNTER — Ambulatory Visit (INDEPENDENT_AMBULATORY_CARE_PROVIDER_SITE_OTHER): Payer: No Typology Code available for payment source | Admitting: Adult Health

## 2021-02-12 VITALS — BP 126/77 | HR 84 | Ht 63.5 in | Wt 244.0 lb

## 2021-02-12 DIAGNOSIS — Z975 Presence of (intrauterine) contraceptive device: Secondary | ICD-10-CM

## 2021-02-12 DIAGNOSIS — N926 Irregular menstruation, unspecified: Secondary | ICD-10-CM

## 2021-02-12 DIAGNOSIS — Z01419 Encounter for gynecological examination (general) (routine) without abnormal findings: Secondary | ICD-10-CM | POA: Insufficient documentation

## 2021-02-12 DIAGNOSIS — Z302 Encounter for sterilization: Secondary | ICD-10-CM | POA: Insufficient documentation

## 2021-02-12 NOTE — Progress Notes (Signed)
Patient ID: [name], female   DOB: 01-11-1986, 35 y.o.   MRN: [id number] History of Present Illness:  [name] is a 35 year old white female.married, [id number], in for a well woman gyn exam and pap. She has been diagnosed with idiopathetic intracranial hypertension and is seeing neurologist. She has nexplanon and has had some irregular bleeding.  PCP is Robyne Peers PA.    Current Medications, Allergies, Past Medical History, Past Surgical History, Family History and Social History were reviewed in [employer] record.     Review of Systems: Patient denies any daily headaches, hearing loss, fatigue, blurred vision, shortness of breath, chest pain, abdominal pain, problems with bowel movements, urination, or intercourse. No joint pain or mood swings.  +irregular bleeding   Physical Exam:BP 126/77 (BP Location: Left Arm, Patient Position: Sitting, Cuff Size: Large)   Pulse 84   Ht 5' 3.5" (1.613 m)   Wt 244 lb (110.7 kg)   LMP 02/07/2021   BMI 42.54 kg/m   General:  Well developed, well nourished, no acute distress Skin:  Warm and dry Neck:  Midline trachea, normal thyroid, good ROM, no lymphadenopathy Lungs; Clear to auscultation bilaterally Breast:  No dominant palpable mass, retraction, or nipple discharge Cardiovascular: Regular rate and rhythm Abdomen:  Soft, non tender, no hepatosplenomegaly Pelvic:  External genitalia is normal in appearance, no lesions.  The vagina is normal in appearance. Urethra has no lesions or masses. The cervix is bulbous. Pap with HR HPV genotyping performed.  Uterus is felt to be normal size, shape, and contour.  No adnexal masses or tenderness noted.Bladder is non tender, no masses felt. Extremities/musculoskeletal:  No swelling or varicosities noted, no clubbing or cyanosis Psych:  No mood changes, alert and cooperative,seems happy AA is 0  Fall risk is low Depression screen [facility] 2/9 02/12/2021 01/01/2019  Decreased  Interest 0 0  Down, Depressed, Hopeless 0 0  PHQ - 2 Score 0 0  Altered sleeping 1 -  Tired, decreased energy 2 -  Change in appetite 2 -  Feeling bad or failure about yourself  2 -  Trouble concentrating 0 -  Moving slowly or fidgety/restless 0 -  Suicidal thoughts 0 -  PHQ-9 Score 7 -    GAD 7 : Generalized Anxiety Score 02/12/2021  Nervous, Anxious, on Edge 1  Control/stop worrying 1  Worry too much - different things 1  Trouble relaxing 1  Restless 0  Easily annoyed or irritable 0  Afraid - awful might happen 0  Total GAD 7 Score 4      Upstream - 02/12/21 1119       Pregnancy Intention Screening   Does the patient want to become pregnant in the next year? No    Does the patient's partner want to become pregnant in the next year? No    Would the patient like to discuss contraceptive options today? Yes      Contraception Wrap Up   Current Method Hormonal Implant            Examination chaperoned by Malachy Mood LPN  Impression and Plan: 1. Encounter for gynecological examination with Papanicolaou smear of cervix Pap sent Physical in 1 year Pap in 3 if normal   2. Nexplanon in place Talk with neurolgoist about having the nexplanon  and IIH  3. Irregular bleeding

## 2021-02-12 NOTE — Progress Notes (Signed)
Patient ID: [name], female   DOB: 1985/08/10, 35 y.o.   MRN: [id number]

## 2021-02-15 DIAGNOSIS — G932 Benign intracranial hypertension: Secondary | ICD-10-CM

## 2021-02-18 LAB — CYTOLOGY - PAP
Comment: NEGATIVE
Diagnosis: NEGATIVE
High risk HPV: NEGATIVE

## 2021-02-19 ENCOUNTER — Ambulatory Visit: Payer: No Typology Code available for payment source | Admitting: Obstetrics & Gynecology

## 2021-03-15 ENCOUNTER — Telehealth: Payer: Self-pay | Admitting: Neurology

## 2021-03-15 NOTE — Telephone Encounter (Signed)
I spoke to the patient. She has continued taking acetazolamide ER 500mg , one cap BID. Reports intermittent tingling is more pronounced and quite bothersome. Present in right-side of face, feet and hands. She also notes twitching in her lips. Additionally, her daily headaches returned last week. She is aware we will have Dr. review her chart and she will receive a call back.

## 2021-03-15 NOTE — Telephone Encounter (Signed)
Spoke with patient, will repeat MRI, and send her for LP. She will continue Diamox at 500 mg BID.

## 2021-03-15 NOTE — Telephone Encounter (Signed)
Spoke with patient, reports she still having intermittent tingling, but she wanted to inform me that since last week the headaches have returned and now she is having right-sided tingling sensation and right-sided lip twitching. Because of the previous finding of on her MRI I will repeat the MRI Brain, this time with contrast.  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  I will see her as scheduled for follow-up.  Windell Norfolk, MD

## 2021-03-15 NOTE — Telephone Encounter (Signed)
Pt called states she is having some tingling with the acetaZOLAMIDE ER (DIAMOX) 500 MG capsule. Pt requesting a call back.

## 2021-03-17 ENCOUNTER — Telehealth: Payer: Self-pay | Admitting: Neurology

## 2021-03-17 ENCOUNTER — Other Ambulatory Visit: Payer: Self-pay | Admitting: Neurology

## 2021-03-17 DIAGNOSIS — G932 Benign intracranial hypertension: Secondary | ICD-10-CM

## 2021-03-17 NOTE — Telephone Encounter (Signed)
The MRI order is good, but the LP order is now gone. Do you want her to have a LP?

## 2021-03-17 NOTE — Telephone Encounter (Signed)
Can you put a new order in for the MRI and LP it was not order correctly.

## 2021-03-17 NOTE — Telephone Encounter (Signed)
Yes please when you get put a new LP order in. Thank you

## 2021-03-23 ENCOUNTER — Other Ambulatory Visit: Payer: Self-pay

## 2021-03-23 ENCOUNTER — Ambulatory Visit
Admission: RE | Admit: 2021-03-23 | Discharge: 2021-03-23 | Disposition: A | Discharge status: Home / Self Care | Payer: No Typology Code available for payment source | Source: Ambulatory Visit | Attending: Neurology | Admitting: Neurology

## 2021-03-23 VITALS — BP 135/72 | HR 70

## 2021-03-23 DIAGNOSIS — G932 Benign intracranial hypertension: Secondary | ICD-10-CM

## 2021-03-23 DIAGNOSIS — R000 Tachycardia, unspecified: Secondary | ICD-10-CM

## 2021-03-23 LAB — CSF CELL COUNT WITH DIFFERENTIAL
Basophils, %: 0 %
Eosinophils, CSF: 1 % — ABNORMAL HIGH
Lymphs, CSF: 40 % (ref 40–80)
Monocyte/Macrophage: 6 % — ABNORMAL LOW (ref 15–45)
RBC Count, CSF: 713 cells/uL — ABNORMAL HIGH
Segmented Neutrophils-CSF: 53 % — ABNORMAL HIGH (ref 0–6)
WBC, CSF: 9 cells/uL — ABNORMAL HIGH (ref 0–5)

## 2021-03-23 LAB — PROTEIN, CSF: Total Protein, CSF: 93 mg/dL — ABNORMAL HIGH (ref 15–45)

## 2021-03-23 LAB — GLUCOSE, CSF: Glucose, CSF: 47 mg/dL (ref 40–80)

## 2021-03-23 NOTE — Discharge Instructions (Signed)

## 2021-03-24 ENCOUNTER — Telehealth: Payer: Self-pay | Admitting: *Deleted

## 2021-03-24 NOTE — Telephone Encounter (Signed)
-----   Message from Suanne Marker, MD sent at 03/23/2021  5:30 PM EST ----- CSF pressure slightly up. Continue acetazolamide. -VRP

## 2021-03-24 NOTE — Telephone Encounter (Signed)
Pt called has some questions about her LP. Pt requesting a call back.

## 2021-03-24 NOTE — Telephone Encounter (Signed)
I spoke to the patient. She wanted to review the information below and confirm she should continue the generic Diamox, as prescribed. She will also keep her pending follow up.

## 2021-03-24 NOTE — Telephone Encounter (Signed)
Traumatic tap. Elevated RBC and protein. Continue current plan. -VRP   CSF pressure slightly up. Continue acetazolamide. -VRP  I spoke to the patient. She verbalized understanding of the findings and will continue with the treatment plan.

## 2021-03-25 NOTE — Telephone Encounter (Signed)
Please inform patient that she will remain on Diamox after the LP.   Dr. Teresa Coombs

## 2021-03-25 NOTE — Telephone Encounter (Signed)
The patient is aware of the results and plan.

## 2021-04-08 ENCOUNTER — Other Ambulatory Visit: Payer: Self-pay

## 2021-04-08 ENCOUNTER — Ambulatory Visit
Admission: RE | Admit: 2021-04-08 | Discharge: 2021-04-08 | Disposition: A | Discharge status: Home / Self Care | Payer: Self-pay | Source: Ambulatory Visit | Attending: Neurology | Admitting: Neurology

## 2021-04-08 DIAGNOSIS — G932 Benign intracranial hypertension: Secondary | ICD-10-CM

## 2021-04-08 MED ORDER — GADOBENATE DIMEGLUMINE 529 MG/ML IV SOLN
20.0000 mL | Freq: Once | INTRAVENOUS | Status: AC | PRN
  Administered 2021-04-08: 20 mL via INTRAVENOUS

## 2021-04-13 ENCOUNTER — Telehealth: Payer: Self-pay | Admitting: Neurology

## 2021-04-13 NOTE — Telephone Encounter (Signed)
I spoke to the patient. She is aware to continue the acetazolamide. I did mention that Megace may cause an increased appetite and to be mindful of weight gain. She verbalized understanding.

## 2021-04-13 NOTE — Telephone Encounter (Signed)
Called the patient. States she is being placed on this medication by her OB-GYN. It is being used for breakthrough/irregular bleeding between her menstrual cycles. I checked the Micromedex and did not see an interaction between the drugs. I will send to Dr. Teresa Coombs for the final decision. The patient is aware I will call her back.

## 2021-04-13 NOTE — Telephone Encounter (Signed)
No known interaction between acetazolamide and Megace, please advise patient to continue with acetazolamide as directed.

## 2021-04-13 NOTE — Telephone Encounter (Signed)
Pt called went to see her OBGYN and she is wanting to put the pt on MEGACE. However pt is on acetaZOLAMIDE ER (DIAMOX) 500 MG capsule and wanting to make sure the 2 medications won't interact with one another. Pt requesting a call back.

## 2021-05-06 ENCOUNTER — Ambulatory Visit (INDEPENDENT_AMBULATORY_CARE_PROVIDER_SITE_OTHER): Payer: PRIVATE HEALTH INSURANCE | Admitting: Neurology

## 2021-05-06 ENCOUNTER — Encounter: Payer: Self-pay | Admitting: Neurology

## 2021-05-06 VITALS — BP 127/80 | HR 82 | Ht 63.5 in | Wt 255.2 lb

## 2021-05-06 DIAGNOSIS — G932 Benign intracranial hypertension: Secondary | ICD-10-CM

## 2021-05-06 MED ORDER — TOPIRAMATE 50 MG PO TABS: 50.0000 mg | ORAL_TABLET | Freq: Two times a day (BID) | ORAL | 4 refills | Status: DC

## 2021-05-06 NOTE — Patient Instructions (Signed)
Continue with Diamox 500 mg twice daily  Start with Topiramate 50 mg twice daily  Encourage weight loss  Follow up in 6 months or sooner if worse

## 2021-05-06 NOTE — Progress Notes (Signed)
[facility]  PATIENT: [name] DOB: [birth year]  REFERRING CLINICIAN: Royann Shivers, * HISTORY FROM: Patient and husband  REASON FOR VISIT: Headaches    HISTORICAL  CHIEF COMPLAINT:  Chief Complaint  Patient presents with   Follow-up    Room 17 - alone. Follow up for IIH. Reports 1-2 headache days per week and "foggy" vision. She has continued taking Diamox 500mg , one tab BID.    INTERVAL HISTORY 05/06/2033 Patient presents today for follow-up for her IIH.  She reports headaches have improved.  Currently she is getting 1-2 headaches per week when on initial presentation she had headache for 3 weeks straight.  LP was done with opening pressure of 26, at that time she was already on Diamox.  I also repeated her MRI brain and the frontal lesion is stable and not enhancing therefore nonspecific.  She is currently satisfied with her current headache frequency but would like to have 0 headaches.  She has started dietary modification and exercise to lose weight.     HISTORY OF PRESENT ILLNESS:  This is a 36 year old woman with no reported past medical history other than obesity who is presenting with complaint of headaches.  Patient states that she has been having headache for the past 420-months but worse in the past 3 weeks.  Reported in the past 3 weeks the headache has been constant, wake up with headaches, go to sleep with the headaches. During this time also, she has no energy, feels weak and nothing seems to help with the headaches.  She has tried Tylenol with no real relief.  Patient stated a year ago she has been having vision problem, described as blurry vision, visual obscuration when going from sitting to standing position.  She had seen 3 ophthalmologists the latest one did a Brain MRI and refer her to neurology because he did not see any primary ophthalmological abnormality.  Patient denies any family history of migraines or headaches. Headaches are  described as bitemporal throbbing pain.    Headache History and Characteristics: Onset: 3 months but constant in the past 3 weeks  Location: temples  Quality:  throbbing  Intensity: 6-7/10.  Duration Constant for the past 3 weeks  Migrainous Features: nausea,.  Aura: No  History of brain injury or tumor: No  Family history: None  Motion sickness: no Cardiac history: no  OTC: tylenol, codeine Caffeine: Yes,coffee in the AM  Sleep: good, but does snore  Mood/ Stress: good.  Prior prophylaxis: Propranolol: No  Verapamil:No TCA: No Topamax: No Depakote: No Effexor: No Cymbalta: No Neurontin:No  Prior abortives: Triptan: No Anti-emetic: No Steroids: No Ergotamine suppository: No    OTHER MEDICAL CONDITIONS: None reported    REVIEW OF SYSTEMS: Full 14 system review of systems performed and negative with exception of: as noted in the HPI   ALLERGIES: Allergies  Allergen Reactions   Hydrocodone    Codeine Itching and Other (See Comments)    Does not like the way it makes her feel.   Dilaudid [Hydromorphone Hcl] Hives and Itching   Hydromorphone    Shellfish Allergy Rash    HOME MEDICATIONS: Outpatient Medications Prior to Visit  Medication Sig Dispense Refill   acetaZOLAMIDE ER (DIAMOX) 500 MG capsule Take 1 capsule (500 mg total) by mouth 2 (two) times daily. 180 capsule 3   Carboxymethylcell-Glycerin PF (REFRESH RELIEVA PF) 0.5-1 % SOLN Apply to eye 4 (four) times daily. Pt doesn't know the dosage     etonogestrel (  NEXPLANON) 68 MG IMPL implant Inject 1 each into the skin once.     VITAMIN D PO Take by mouth.     No facility-administered medications prior to visit.    PAST MEDICAL HISTORY: Past Medical History:  Diagnosis Date   Abnormal uterine bleeding (AUB) 01/19/2015   Complication of anesthesia    Depression    Family history of adverse reaction to anesthesia    Fibrocystic breast changes    Hypertension    Hyperthyroidism    Migraines     Nexplanon in place 01/19/2015   Nexplanon insertion 11/29/2012   inserted nexplanon 11/29/12 in left arm, remove 11/30/15   Obesity    Preterm delivery    Seasonal rhinitis    UTI (lower urinary tract infection)     PAST SURGICAL HISTORY: Past Surgical History:  Procedure Laterality Date   CHOLECYSTECTOMY     ORIF TIBIA PLATEAU Left 07/01/2015   Procedure: OPEN REDUCTION INTERNAL FIXATION (ORIF) TIBIAL PLATEAU AND BONE GRAFT LEFT TIBIAL PLATEAU;  Surgeon: Vickki HearingStanley E Harrison, MD;  Location: [facility];  Service: Orthopedics;  Laterality: Left;   TONSILLECTOMY      FAMILY HISTORY: Family History  Problem Relation Age of Onset   Hypertension Mother    Hypertension Father    Hypertension Maternal Grandmother    Hypertension Maternal Grandfather    Diabetes Paternal Grandmother    Hypertension Paternal Grandmother    Heart disease Paternal Grandfather    Diabetes Paternal Grandfather    Hypertension Paternal Grandfather     SOCIAL HISTORY: Social History   Socioeconomic History   Marital status: Married    Spouse name: Not on file   Number of children: 2   Years of education: Not on file   Highest education level: Not on file  Occupational History   Not on file  Tobacco Use   Smoking status: Never   Smokeless tobacco: Never  Vaping Use   Vaping Use: Never used  Substance and Sexual Activity   Alcohol use: No   Drug use: No   Sexual activity: Yes    Birth control/protection: Implant  Other Topics Concern   Not on file  Social History Narrative   Not on file   Social Determinants of Health   Financial Resource Strain: Low Risk    Difficulty of Paying Living Expenses: Not hard at all  Food Insecurity: No Food Insecurity   Worried About Programme researcher, broadcasting/film/videounning Out of Food in the Last Year: Never true   Ran Out of Food in the Last Year: Never true  Transportation Needs: No Transportation Needs   Lack of Transportation (Medical): No   Lack of Transportation (Non-Medical): No   Physical Activity: Insufficiently Active   Days of Exercise per Week: 3 days   Minutes of Exercise per Session: 20 min  Stress: Stress Concern Present   Feeling of Stress : To some extent  Social Connections: Moderately Integrated   Frequency of Communication with Friends and Family: More than three times a week   Frequency of Social Gatherings with Friends and Family: Once a week   Attends Religious Services: 1 to 4 times per year   Active Member of [employer] or Organizations: No   Attends BankerClub or Organization Meetings: Never   Marital Status: Married  Catering managerntimate Partner Violence: Not At Risk   Fear of Current or Ex-Partner: No   Emotionally Abused: No   Physically Abused: No   Sexually Abused: No     PHYSICAL EXAM  GENERAL  EXAM/CONSTITUTIONAL: Vitals:  Vitals:   05/06/21 1308  BP: 127/80  Pulse: 82  Weight: 255 lb 3.2 oz (115.8 kg)  Height: 5' 3.5" (1.613 m)   Body mass index is 44.5 kg/m. Wt Readings from Last 3 Encounters:  05/06/21 255 lb 3.2 oz (115.8 kg)  02/12/21 244 lb (110.7 kg)  02/05/21 251 lb (113.9 kg)   Patient is in no distress; well developed, nourished and groomed; neck is supple  EYES: Pupils round and reactive to light, Visual fields full to confrontation, Extraocular movements intacts,   MUSCULOSKELETAL: Gait, strength, tone, movements noted in Neurologic exam below  NEUROLOGIC: MENTAL STATUS:  No flowsheet data found. awake, alert, oriented to person, place and time recent and remote memory intact normal attention and concentration language fluent, comprehension intact, naming intact fund of knowledge appropriate  CRANIAL NERVE:  2nd - no papilledema or hemorrhages on fundoscopic exam 2nd, 3rd, 4th, 6th - pupils equal and reactive to light, visual fields full to confrontation, extraocular muscles intact, no nystagmus 5th - facial sensation symmetric 7th - facial strength symmetric 8th - hearing intact 9th - palate elevates symmetrically,  uvula midline 11th - shoulder shrug symmetric 12th - tongue protrusion midline  MOTOR:  normal bulk and tone, full strength in the BUE, BLE  SENSORY:  normal and symmetric to light touch, pinprick, temperature, vibration  COORDINATION:  finger-nose-finger, fine finger movements normal  REFLEXES:  deep tendon reflexes present and symmetric  GAIT/STATION:  normal   DIAGNOSTIC DATA (LABS, IMAGING, TESTING) - I reviewed patient records, labs, notes, testing and imaging myself where available.  Lab Results  Component Value Date   WBC 8.6 12/31/2019   HGB 14.9 12/31/2019   HCT 44.8 12/31/2019   MCV 90.7 12/31/2019   PLT 436 (H) 12/31/2019      Component Value Date/Time   NA 139 12/31/2019 1532   K 3.7 12/31/2019 1532   CL 104 12/31/2019 1532   CO2 23 12/31/2019 1532   GLUCOSE 104 (H) 12/31/2019 1532   BUN 9 12/31/2019 1532   CREATININE 0.67 12/31/2019 1532   CALCIUM 9.7 12/31/2019 1532   PROT 8.6 (H) 01/11/2019 1407   ALBUMIN 4.5 01/11/2019 1407   AST 25 01/11/2019 1407   ALT 29 01/11/2019 1407   ALKPHOS 64 01/11/2019 1407   BILITOT 0.5 01/11/2019 1407   GFRNONAA >60 12/31/2019 1532   GFRAA >60 12/31/2019 1532   Lab Results  Component Value Date   CHOL 130 05/09/2007   HDL 43 05/09/2007   LDLCALC 74 05/09/2007   TRIG 64 05/09/2007   CHOLHDL 3.0 Ratio 05/09/2007   No results found for: HGBA1C No results found for: VITAMINB12 Lab Results  Component Value Date   TSH 1.592 01/11/2019     MRI Brain without contrast  Artifact obscures portions of the posterior fossa on several sequences. Partially empty sella turcica. While this finding often reflects incidental anatomic variation, it can also be associated with idiopathic intracranial hypertension (pseudotumor cerebri). 5-6 mm ovoid focus of signal abnormality within the right frontal lobe periventricular white matter, as described. This finding is nonspecific, but could potentially reflect a small focus of  nodular gray matter heterotopia or sequela of a nonspecific remote insult. Within described limitations, there is an otherwise unremarkable non-contrast MRI appearance of the brain. Paranasal sinus disease, as described.   MRI Brain with and without contrast 04/08/2021 Unremarkable MRI brain with and without contrast.  No acute findings.     ASSESSMENT AND PLAN  35  y.o. year old female with past medical history of obesity who is presenting for follow up for her Idiopathic Intracranial Hypertension.  She completed her LP with opening pressure of 26.  She is currently on Diamox 500 mg twice daily with improvement of her symptoms, currently she is getting 1-2 headaches per week.  MRI of the brain was repeated and a 6 mm lesion in the right frontal lobe is stable and not enhancing and nonenhancing.  Currently she is working on losing weight with diet and exercise.  Patient would like to go to a 0 frequency headache.  She reported it is difficult for her to take medication 3 times daily, therefore I will add Topamax to her regimen.  She will take Topamax 50 mg twice daily with her Diamox.  I hope it will help her headache frequency and also help her with losing weight.  If headaches are not controlled, we can still go up on the Topamax up to 100 mg twice daily.  Follow-up in 6 months    1. Idiopathic intracranial hypertension     Patient Instructions  Continue with Diamox 500 mg twice daily  Start with Topiramate 50 mg twice daily  Encourage weight loss  Follow up in 6 months or sooner if worse   No orders of the defined types were placed in this encounter.    Meds ordered this encounter  Medications   topiramate (TOPAMAX) 50 MG tablet    Sig: Take 1 tablet (50 mg total) by mouth 2 (two) times daily.    Dispense:  180 tablet    Refill:  4     Return in about 6 months (around 11/03/2021).    Windell NorfolkAmadou Theodore Virgin, MD 05/06/2021, 3:51 PM  [facility] [address],  [address] [place], [place] [address] [phone]

## 2021-05-10 ENCOUNTER — Ambulatory Visit: Payer: PRIVATE HEALTH INSURANCE | Admitting: Obstetrics & Gynecology

## 2021-06-03 ENCOUNTER — Other Ambulatory Visit: Payer: Self-pay

## 2021-06-04 ENCOUNTER — Ambulatory Visit (INDEPENDENT_AMBULATORY_CARE_PROVIDER_SITE_OTHER): Payer: PRIVATE HEALTH INSURANCE

## 2021-06-04 ENCOUNTER — Ambulatory Visit
Admission: RE | Admit: 2021-06-04 | Discharge: 2021-06-04 | Disposition: A | Discharge status: Home / Self Care | Payer: PRIVATE HEALTH INSURANCE | Source: Ambulatory Visit | Attending: Urgent Care | Admitting: Urgent Care

## 2021-06-04 VITALS — BP 129/86 | HR 76 | Temp 98.8°F | Resp 18

## 2021-06-04 DIAGNOSIS — R1033 Periumbilical pain: Secondary | ICD-10-CM

## 2021-06-04 DIAGNOSIS — R1031 Right lower quadrant pain: Secondary | ICD-10-CM | POA: Diagnosis not present

## 2021-06-04 DIAGNOSIS — M545 Low back pain, unspecified: Secondary | ICD-10-CM

## 2021-06-04 DIAGNOSIS — Z3202 Encounter for pregnancy test, result negative: Secondary | ICD-10-CM

## 2021-06-04 DIAGNOSIS — R1030 Lower abdominal pain, unspecified: Secondary | ICD-10-CM

## 2021-06-04 LAB — POCT URINALYSIS DIP (MANUAL ENTRY)
Bilirubin, UA: NEGATIVE
Blood, UA: NEGATIVE
Glucose, UA: NEGATIVE mg/dL
Ketones, POC UA: NEGATIVE mg/dL
Leukocytes, UA: NEGATIVE
Nitrite, UA: NEGATIVE
Protein Ur, POC: NEGATIVE mg/dL
Spec Grav, UA: 1.025 (ref 1.010–1.025)
Urobilinogen, UA: 1 E.U./dL
pH, UA: 6 (ref 5.0–8.0)

## 2021-06-04 LAB — POCT URINE PREGNANCY: Preg Test, Ur: NEGATIVE

## 2021-06-04 MED ORDER — NAPROXEN 500 MG PO TABS: 500.0000 mg | ORAL_TABLET | Freq: Two times a day (BID) | ORAL | 0 refills | Status: DC

## 2021-06-04 NOTE — ED Triage Notes (Signed)
Pt reports on and off  right lower quadrant abdominal pain, radiates to hip and lower back pain, increased urinary frequency x 2 days. Pt reports ?

## 2021-06-04 NOTE — ED Provider Notes (Signed)
?[facility] ? ? ?MRN: [id number] DOB: 1985-06-10 ? ?Subjective:  ? ?[name] is a 36 y.o. female presenting for 2-day history of acute onset mid to right-sided lower abdominal pain, left low back pain that radiates into the hip and buttock.  Has also had some urinary frequency.  No fever, nausea, vomiting, dysuria, vaginal bleeding.  No traumas, falls.  No history of spinal, musculoskeletal disorders.  She has a Nexplanon in place.  No history of fibroids or cysts or gynecologic disorders that she knows of.  She has had abnormal uterine bleeding before and was previously also associated with Nexplanon.  She has no concern for sexually transmitted infection as she is married and in a monogamous relationship to the best of her knowledge.  She still has her appendix.  Has a history of a cholecystectomy and reports having intermittent loose stools.  She does not have a bowel movement every day but every 2 or 3 days.  No bloody stools. ? ?No current facility-administered medications for this encounter. ? ?Current Outpatient Medications:  ?  acetaZOLAMIDE ER (DIAMOX) 500 MG capsule, Take 1 capsule (500 mg total) by mouth 2 (two) times daily., Disp: 180 capsule, Rfl: 3 ?  Carboxymethylcell-Glycerin PF (REFRESH RELIEVA PF) 0.5-1 % SOLN, Apply to eye 4 (four) times daily. Pt doesn't know the dosage, Disp: , Rfl:  ?  etonogestrel (NEXPLANON) 68 MG IMPL implant, Inject 1 each into the skin once., Disp: , Rfl:  ?  topiramate (TOPAMAX) 50 MG tablet, Take 1 tablet (50 mg total) by mouth 2 (two) times daily., Disp: 180 tablet, Rfl: 4 ?  VITAMIN D PO, Take by mouth., Disp: , Rfl:   ? ?Allergies  ?Allergen Reactions  ? Hydrocodone   ? Codeine Itching and Other (See Comments)  ?  Does not like the way it makes her feel.  ? Dilaudid [Hydromorphone Hcl] Hives and Itching  ? Hydromorphone   ? Shellfish Allergy Rash  ? ? ?Past Medical History:  ?Diagnosis Date  ? Abnormal uterine bleeding (AUB) 01/19/2015  ?  Complication of anesthesia   ? Depression   ? Family history of adverse reaction to anesthesia   ? Fibrocystic breast changes   ? Hypertension   ? Hyperthyroidism   ? Migraines   ? Nexplanon in place 01/19/2015  ? Nexplanon insertion 11/29/2012  ? inserted nexplanon 11/29/12 in left arm, remove 11/30/15  ? Obesity   ? Preterm delivery   ? Seasonal rhinitis   ? UTI (lower urinary tract infection)   ?  ? ?Past Surgical History:  ?Procedure Laterality Date  ? CHOLECYSTECTOMY    ? ORIF TIBIA PLATEAU Left 07/01/2015  ? Procedure: OPEN REDUCTION INTERNAL FIXATION (ORIF) TIBIAL PLATEAU AND BONE GRAFT LEFT TIBIAL PLATEAU;  Surgeon: [name], MD;  Location: [facility];  Service: Orthopedics;  Laterality: Left;  ? TONSILLECTOMY    ? ? ?Family History  ?Problem Relation Age of Onset  ? Hypertension Mother   ? Hypertension Father   ? Hypertension Maternal Grandmother   ? Hypertension Maternal Grandfather   ? Diabetes Paternal Grandmother   ? Hypertension Paternal Grandmother   ? Heart disease Paternal Grandfather   ? Diabetes Paternal Grandfather   ? Hypertension Paternal Grandfather   ? ? ?Social History  ? ?Tobacco Use  ? Smoking status: Never  ? Smokeless tobacco: Never  ?Vaping Use  ? Vaping Use: Never used  ?Substance Use Topics  ? Alcohol use: No  ? Drug use: No  ? ? ?  ROS ? ? ?Objective:  ? ?Vitals: ?BP 129/86 (BP Location: Right Arm)   Pulse 76   Temp 98.8 ?F (37.1 ?C) (Oral)   Resp 18   SpO2 97%  ? ?Physical Exam ?Constitutional:   ?   General: She is not in acute distress. ?   Appearance: Normal appearance. She is well-developed. She is not ill-appearing, toxic-appearing or diaphoretic.  ?HENT:  ?   Head: Normocephalic and atraumatic.  ?   Nose: Nose normal.  ?   Mouth/Throat:  ?   Mouth: Mucous membranes are moist.  ?   Pharynx: Oropharynx is clear.  ?Eyes:  ?   General: No scleral icterus.    ?   Right eye: No discharge.     ?   Left eye: No discharge.  ?   Extraocular Movements: Extraocular movements intact.   ?   Conjunctiva/sclera: Conjunctivae normal.  ?Cardiovascular:  ?   Rate and Rhythm: Normal rate.  ?Pulmonary:  ?   Effort: Pulmonary effort is normal.  ?Abdominal:  ?   General: Bowel sounds are normal. There is no distension.  ?   Palpations: Abdomen is soft. There is no mass.  ?   Tenderness: There is abdominal tenderness in the right lower quadrant, periumbilical area and suprapubic area. There is no right CVA tenderness, left CVA tenderness, guarding or rebound.  ?Musculoskeletal:  ?   Lumbar back: Tenderness (area outlined) present. No swelling, edema, deformity, signs of trauma, lacerations, spasms or bony tenderness. Normal range of motion. Negative right straight leg raise test and negative left straight leg raise test. No scoliosis.  ?     Back: ? ?Skin: ?   General: Skin is warm and dry.  ?Neurological:  ?   General: No focal deficit present.  ?   Mental Status: She is alert and oriented to person, place, and time.  ?Psychiatric:     ?   Mood and Affect: Mood normal.     ?   Behavior: Behavior normal.     ?   Thought Content: Thought content normal.     ?   Judgment: Judgment normal.  ? ? ?Results for orders placed or performed during the hospital encounter of 06/04/21 (from the past 24 hour(s))  ?POCT urinalysis dipstick     Status: None  ? Collection Time: 06/04/21 11:15 AM  ?Result Value Ref Range  ? Color, UA yellow yellow  ? Clarity, UA clear clear  ? Glucose, UA negative negative mg/dL  ? Bilirubin, UA negative negative  ? Ketones, POC UA negative negative mg/dL  ? Spec Grav, UA 1.025 1.010 - 1.025  ? Blood, UA negative negative  ? pH, UA 6.0 5.0 - 8.0  ? Protein Ur, POC negative negative mg/dL  ? Urobilinogen, UA 1.0 0.2 or 1.0 E.U./dL  ? Nitrite, UA Negative Negative  ? Leukocytes, UA Negative Negative  ?POCT urine pregnancy     Status: None  ? Collection Time: 06/04/21 11:15 AM  ?Result Value Ref Range  ? Preg Test, Ur Negative Negative  ? ?DG Abd 1 View ? ?Result Date: 06/04/2021 ?CLINICAL DATA:   Lower abdominal pain EXAM: ABDOMEN - 1 VIEW COMPARISON:  None. FINDINGS: The bowel gas pattern is normal. No radio-opaque calculi or other significant radiographic abnormality are seen. Status post cholecystectomy. IMPRESSION: Negative. Electronically Signed   By: Signa Kell M.D.   On: 06/04/2021 11:51   ? ?Assessment and Plan :  ? ?PDMP not reviewed this encounter. ? ?1.  Lower abdominal pain   ?2. Periumbilical pain   ?3. Abdominal pain, RLQ   ?4. Acute right-sided low back pain without sciatica   ? ?No signs of an acute abdomen. Differential includes gynecologic sources of pain including uterine fibroids, ovarian cysts; possible constipation, colitis; low suspicion for appendicitis but did discuss this as a possible source.  Patient prefers to defer an ER visit for now.  Recommended conservative management, high-fiber diet, naproxen for pain and inflammation.  Follow-up with PCP as soon as possible.  Patient has low concern for STI and therefore deferred this testing.  No signs of acute cystitis on urinalysis and therefore deferred urine culture. Counseled patient on potential for adverse effects with medications prescribed/recommended today, ER and return-to-clinic precautions discussed, patient verbalized understanding. ? ?  ?Wallis Bamberg, PA-C ?06/04/21 1238 ? ?

## 2021-06-14 ENCOUNTER — Ambulatory Visit: Payer: PRIVATE HEALTH INSURANCE | Admitting: Adult Health

## 2021-06-15 ENCOUNTER — Telehealth: Payer: Self-pay | Admitting: Adult Health

## 2021-06-15 MED ORDER — MEGESTROL ACETATE 40 MG PO TABS: ORAL_TABLET | ORAL | 1 refills | Status: DC

## 2021-06-15 NOTE — Addendum Note (Signed)
Addended by: Cyril Mourning A on: 06/15/2021 05:58 PM ? ? Modules accepted: Orders ? ?

## 2021-06-15 NOTE — Telephone Encounter (Signed)
Patient calling stating that she had sent a my chart msg back in January about abnormal bleeding asking for a prescription she is calling today stating the neurologiest states that megace is okay for her to take . And send to [facility]  ?

## 2021-06-15 NOTE — Telephone Encounter (Signed)
Spotting irregularly with nexplanon will rx megace  ?

## 2021-07-12 IMAGING — US US BREAST*L* LIMITED INC AXILLA
1 series · 6 of 6 positions shown · non-contrast
Comparison: None.

ACR Breast Density Category a: The breast tissue is almost entirely
fatty.

CLINICAL DATA: 34-year-old female with several year history of
spontaneous yellow/milky left nipple discharge.

EXAM:
DIGITAL DIAGNOSTIC BILATERAL MAMMOGRAM WITH TOMOSYNTHESIS AND CAD;
ULTRASOUND LEFT BREAST LIMITED
TECHNIQUE: Bilateral digital diagnostic mammography and breast tomosynthesis
was performed. The images were evaluated with computer-aided
detection.; Targeted ultrasound examination of the left breast was
performed

[Series 1: us breast*left* limited inc axilla · 0.09mm/px · 6 of 6 slices shown]
[im 1/6]
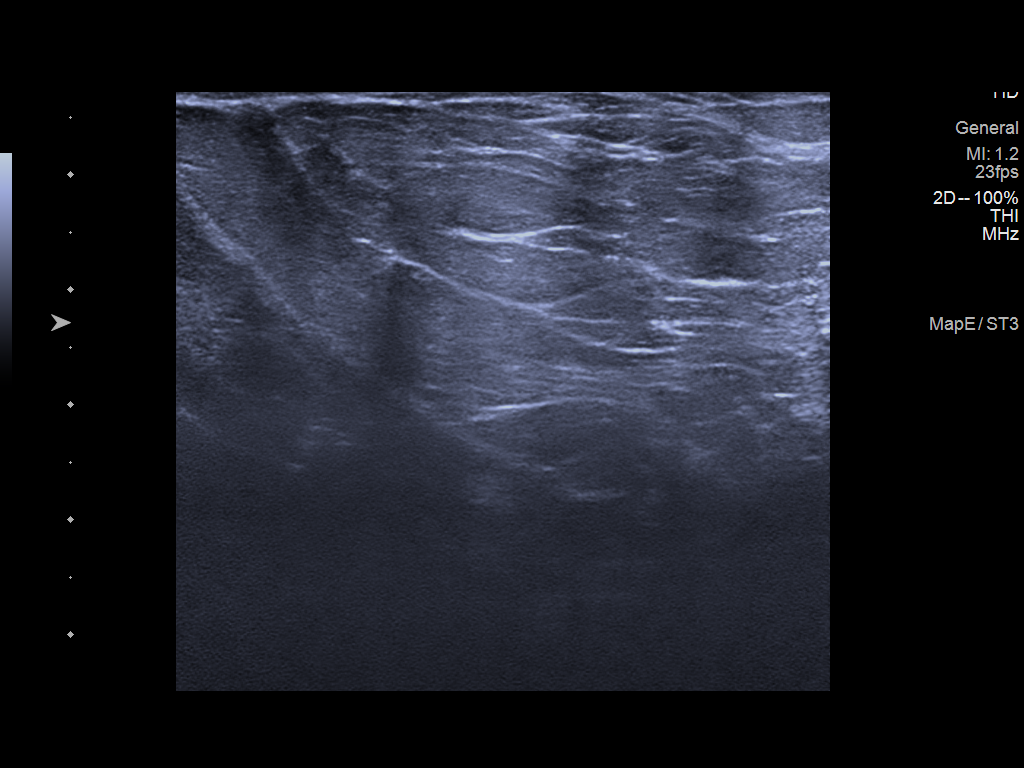
[im 2/6]
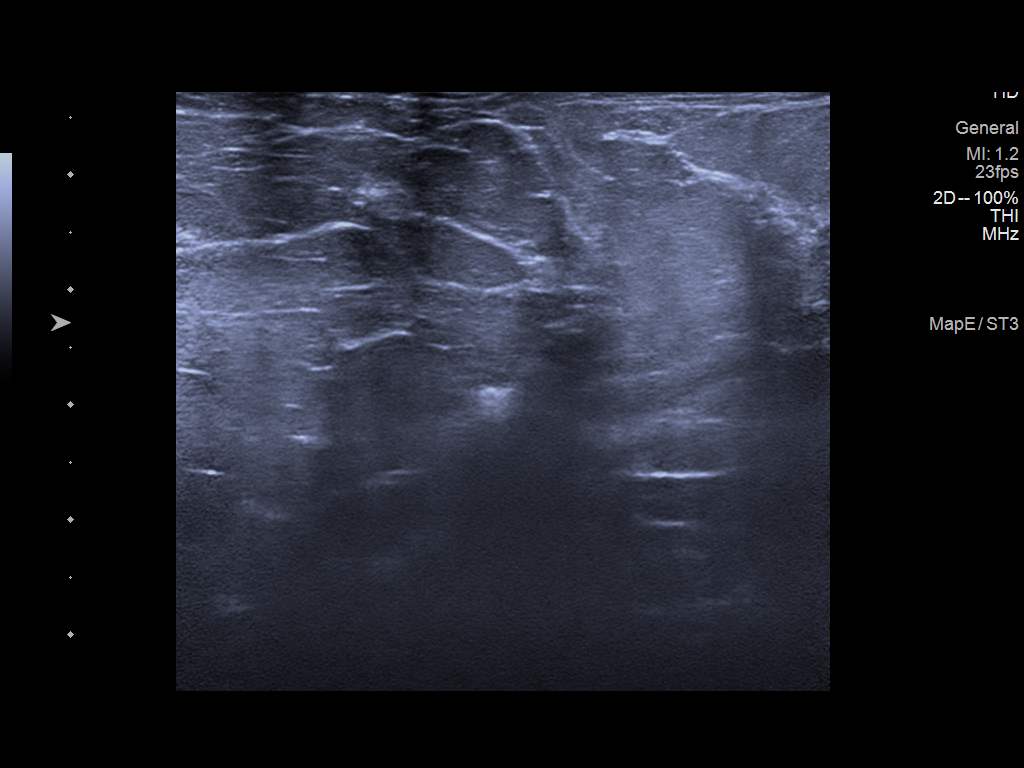
[im 3/6]
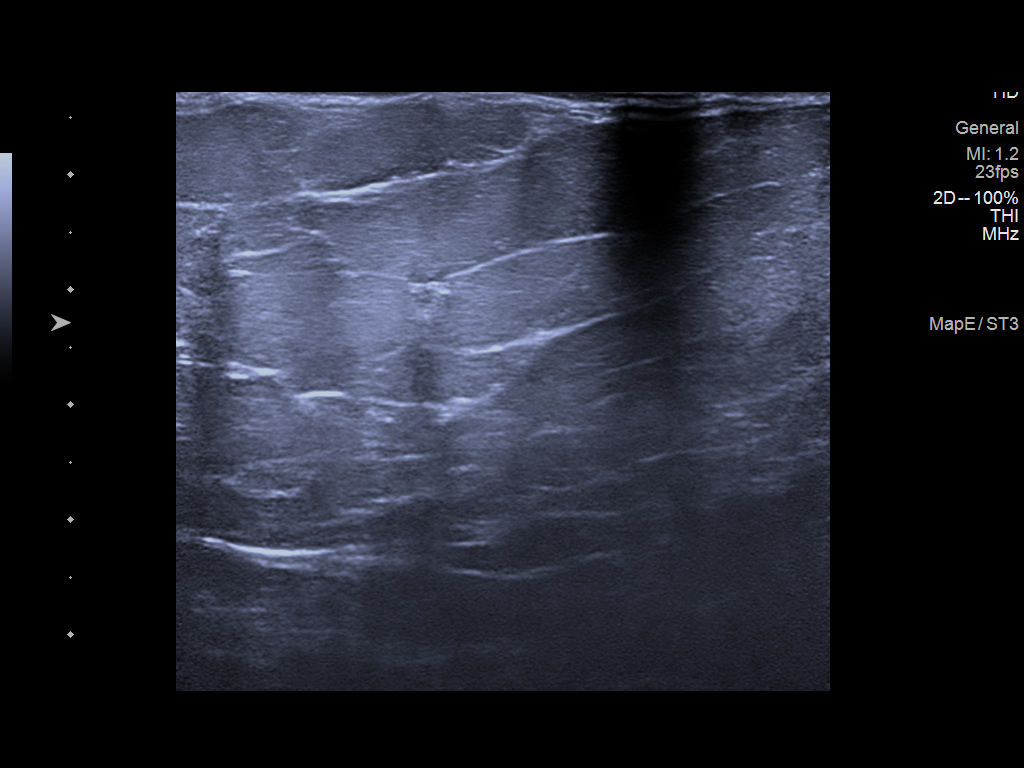
[im 4/6]
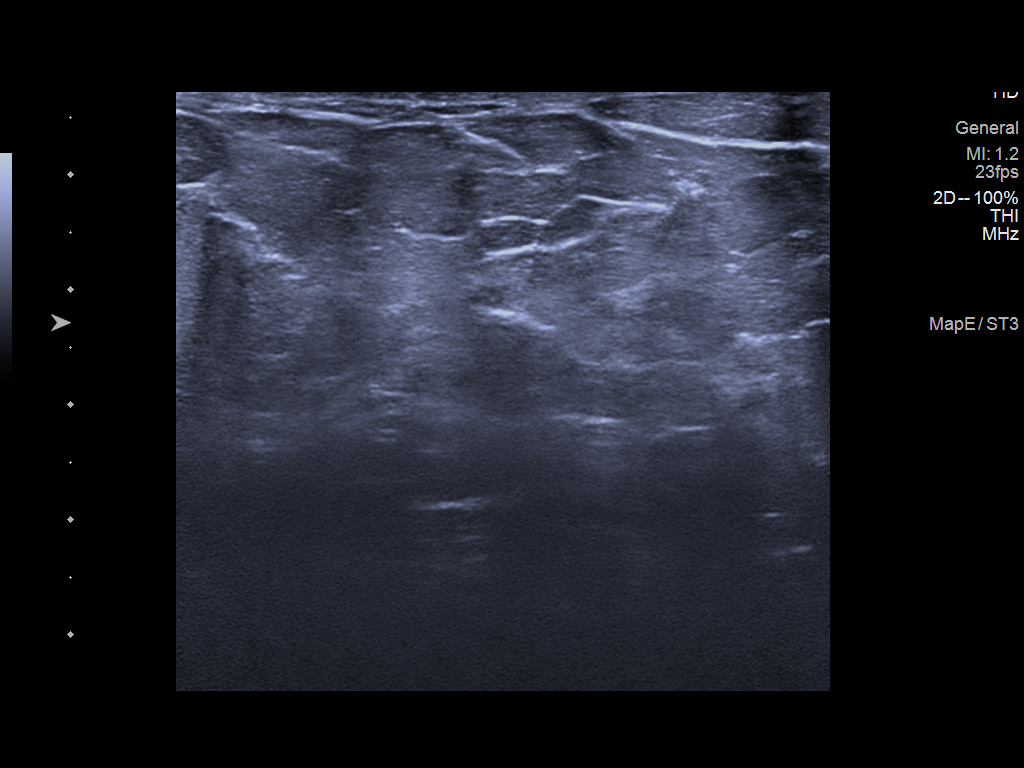
[im 5/6]
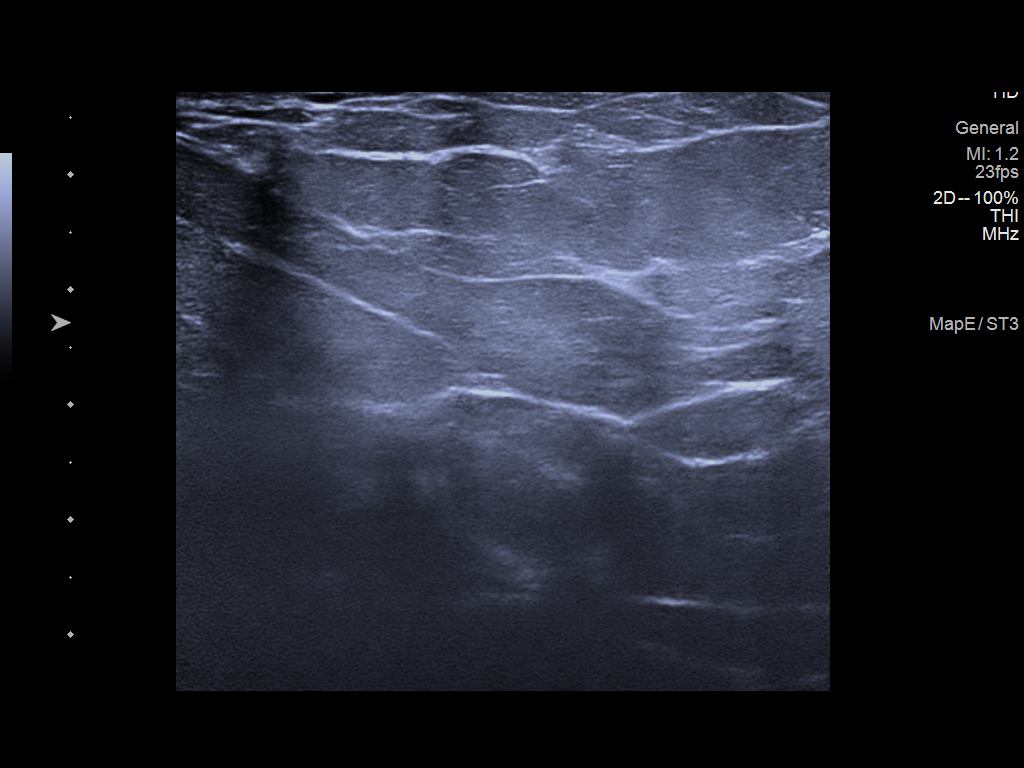
[im 6/6]
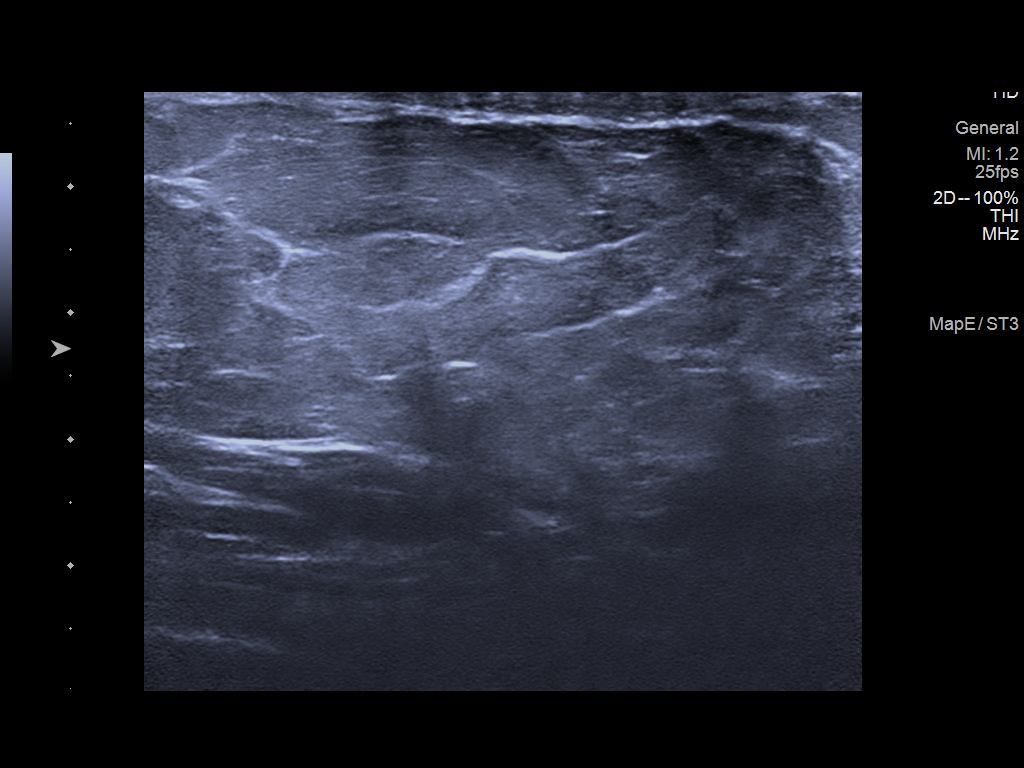

[6 of 6 positions shown; findings below may reference images not displayed]

FINDINGS: No suspicious masses or calcifications are seen in either breast.
Spot compression MLO tomograms were over the retroareolar left
breast with no definite abnormality seen.

Targeted ultrasound of the central left breast was performed. No
suspicious masses or abnormality seen. No dilated ducts or
intraductal masses.
IMPRESSION: 1. No mammographic or sonographic abnormalities to explain left
nipple discharge.

2.  No mammographic breast.

RECOMMENDATION:
1. Recommend further management of nipple discharge be based on
clinical assessment. This is felt to be physiologic/related to a
benign process given yellow/milky color and chronicity. If the
patient experiences spontaneous unilateral bloody or clear nipple
discharge from a single duct only, then further evaluation with
breast MRI is recommended.

2. Screening mammogram at age 40 unless there are persistent or
intervening clinical concerns. (Code:[id number]-CI0)

I have discussed the findings and recommendations with the patient.
If applicable, a reminder letter will be sent to the patient
regarding the next appointment.

BI-RADS CATEGORY  1: Negative.

## 2021-09-27 ENCOUNTER — Other Ambulatory Visit: Payer: PRIVATE HEALTH INSURANCE

## 2021-11-02 ENCOUNTER — Other Ambulatory Visit: Payer: Self-pay | Admitting: Adult Health

## 2021-11-04 ENCOUNTER — Ambulatory Visit: Payer: PRIVATE HEALTH INSURANCE | Admitting: Neurology

## 2021-11-04 ENCOUNTER — Encounter: Payer: Self-pay | Admitting: Neurology

## 2021-11-08 ENCOUNTER — Encounter: Payer: Self-pay | Admitting: Obstetrics & Gynecology

## 2021-11-08 ENCOUNTER — Telehealth: Payer: Self-pay | Admitting: Obstetrics & Gynecology

## 2021-11-08 ENCOUNTER — Ambulatory Visit (INDEPENDENT_AMBULATORY_CARE_PROVIDER_SITE_OTHER): Payer: PRIVATE HEALTH INSURANCE | Admitting: Obstetrics & Gynecology

## 2021-11-08 VITALS — BP 120/83 | Ht 63.0 in | Wt 227.0 lb

## 2021-11-08 DIAGNOSIS — G932 Benign intracranial hypertension: Secondary | ICD-10-CM | POA: Diagnosis not present

## 2021-11-08 DIAGNOSIS — Z975 Presence of (intrauterine) contraceptive device: Secondary | ICD-10-CM | POA: Diagnosis not present

## 2021-11-08 DIAGNOSIS — Z3009 Encounter for other general counseling and advice on contraception: Secondary | ICD-10-CM

## 2021-11-08 NOTE — Telephone Encounter (Signed)
I just got off the phone with the patient and she states that she rather go ahead with the tubal

## 2021-11-08 NOTE — Progress Notes (Signed)
   GYN VISIT Patient name: [name] MRN [id number]  Date of birth: 08/23/1985 Chief Complaint:   wants a tubal & ablation  History of Present Illness:   [name] is a 36 y.o. [id number] female being seen today for contraceptive management.     Nexplanon runs out in October and has been bleeding on/off for a month.  Typically 2 pads per day, mostly spotting, just annoying.  Currently taking Megace which has decreased the amount of bleeding, but has not helped with the frequency.  Even before this past month, did not love Nexplanon because the bleeding was very unpredictable.  Prior to Nexplanon periods were "awful"- heavy bleeding with large clots, lasting for 7-8 days.  Significant dysmenorrhea.  No other acute gyn concerns  OCP risk assessment: Pt denies personal history of VTE, stroke or heart attack.  Denies personal h/o breast cancer.  Pt is either a non-smoker or smoker under the age of 35yo.   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 Patient has had an implant.     02/12/2021   11:26 AM 01/01/2019    1:13 PM  Depression screen PHQ 2/9  Decreased Interest 0 0  Down, Depressed, Hopeless 0 0  PHQ - 2 Score 0 0  Altered sleeping 1   Tired, decreased energy 2   Change in appetite 2   Feeling bad or failure about yourself  2   Trouble concentrating 0   Moving slowly or fidgety/restless 0   Suicidal thoughts 0   PHQ-9 Score 7      Review of Systems:   Pertinent items are noted in HPI Denies fever/chills, dizziness, headaches, visual disturbances, fatigue, shortness of breath, chest pain, abdominal pain, vomiting, no problems with bowel movements, urination, or intercourse unless otherwise stated above.  Pertinent History Reviewed:  Reviewed past medical,surgical, social, obstetrical and family history.  Reviewed problem list, medications and allergies. Physical Assessment:   Vitals:    11/08/21 0853  BP: 120/83  Weight: 227 lb (103 kg)  Height: 5\' 3"  (1.6 m)  Body mass index is 40.21 kg/m.       Physical Examination:   General appearance: alert, well appearing, and in no distress  Psych: mood appropriate, normal affect  Skin: warm & dry   Cardiovascular: normal heart rate noted  Respiratory: normal respiratory effort, no distress  Pelvic: examination not indicated  Extremities: left arm with palpable device- no evidence of infection or migration  Chaperone: N/A    Assessment & Plan:  1) Contraceptive management -reviewed all contraceptive options ranging from OCPs, patch, vaginal ring to LARCs -due to prior HMB encouraged pt to consider IUD -discussed tubal ligation/salpingectomy- reviewed risk/benefits, effectiveness and recovery -discussed that while salpingectomy will be ideal for contraception, she will return to regular menses, which per pt were "awful" -pt given sheet with all options to review on her own as she was not ready to make a decision today -pt call/follow up once she has decided on next step   , DO Attending Obstetrician & Gynecologist, [facility], [facility]

## 2021-11-10 ENCOUNTER — Encounter: Payer: Self-pay | Admitting: Obstetrics & Gynecology

## 2021-11-23 ENCOUNTER — Telehealth: Payer: Self-pay

## 2021-11-23 NOTE — Telephone Encounter (Signed)
Pt called to let Dr. Charlotta Newton know that she is still bleeding and has been for over 2 months. She has tried to use megace and it only slows bleeding down to spotting but it doesn't go away. She currently has a nexplanon, she denies any risk of std or new sexual partners. She wanted to see what else could be done to stop the bleeding? Advised I would send message to Dr. Charlotta Newton and we would be back in touch.

## 2021-11-23 NOTE — Telephone Encounter (Signed)
Patient called and stated that she has been bleeding for the last 2 months.  Patient would like for someone to call her.

## 2021-11-25 ENCOUNTER — Telehealth: Payer: Self-pay | Admitting: Obstetrics & Gynecology

## 2021-11-25 DIAGNOSIS — N926 Irregular menstruation, unspecified: Secondary | ICD-10-CM

## 2021-11-25 MED ORDER — MEGESTROL ACETATE 40 MG PO TABS: 80.0000 mg | ORAL_TABLET | Freq: Every day | ORAL | 2 refills | Status: AC

## 2021-11-25 NOTE — Telephone Encounter (Signed)
Called patient the Megace has slowed her period to light spotting, but when she stops taking this she will have heavy bleeding with passage of clots.  She is scheduled for tubal ligation in October and wants to add the ablation to her procedure.  She is concerned that her period is going to be heavy and doesn't want to have 2 surgeries if she can have both at the same time.  Message sent to Myrle Sheng to add to surgical schedule  Myna Hidalgo, DO Attending Obstetrician & Gynecologist, [facility] for [facility], [facility]

## 2021-11-25 NOTE — Telephone Encounter (Signed)
Pt called see other phone encounter

## 2021-11-26 ENCOUNTER — Encounter: Payer: Self-pay | Admitting: Obstetrics & Gynecology

## 2021-12-16 ENCOUNTER — Telehealth: Payer: Self-pay | Admitting: Orthopedic Surgery

## 2021-12-16 NOTE — Telephone Encounter (Signed)
Patient called to relay she is having problem with her left lower leg; has had surgery on it with hardware by Dr [name] in 2017. Requests appointment; in process of scheduling - offered 12/27/21 and to be on wait list, lost connections. Called back to patient to try to reach to schedule; done.

## 2021-12-22 ENCOUNTER — Telehealth: Payer: Self-pay | Admitting: Obstetrics & Gynecology

## 2021-12-22 NOTE — Telephone Encounter (Signed)
Patient calling wanting letter stating that she is having surgery and how long she had to be out. Fax number [phone]

## 2021-12-23 DIAGNOSIS — Z029 Encounter for administrative examinations, unspecified: Secondary | ICD-10-CM

## 2021-12-27 ENCOUNTER — Ambulatory Visit: Payer: PRIVATE HEALTH INSURANCE | Admitting: Orthopedic Surgery

## 2021-12-29 ENCOUNTER — Encounter: Payer: Self-pay | Admitting: Obstetrics & Gynecology

## 2021-12-29 ENCOUNTER — Telehealth: Payer: Self-pay | Admitting: Cardiovascular Disease

## 2021-12-29 NOTE — Telephone Encounter (Signed)
Pt notified of Dr. Kyla Balzarine notes. Pt voiced understanding and states that she will attend appt on tomorrow.

## 2021-12-29 NOTE — Telephone Encounter (Signed)
Spoke with pt who states that on last night around 8 she began to have chest pain on and off. Pt reports that the pain started on the rt side of her chest and is now on the rt and left side.Pt denies SOB, N/V and sweating. Pt has not taken anything to relieve the pain. Please advise.

## 2021-12-29 NOTE — Telephone Encounter (Signed)
Pt c/o of Chest Pain: STAT if CP now or developed within 24 hours  1. Are you having CP right now? Yes. Started last night around 8 P.M.   2. Are you experiencing any other symptoms (ex. SOB, nausea, vomiting, sweating)? Breathing feels "funny".   3. How long have you been experiencing CP?   4. Is your CP continuous or coming and going? Coming and going  5. Have you taken Nitroglycerin? No.  Pt states that she is having sharp pain started on the right side of chest, but has also started on her left side.  ?

## 2021-12-29 NOTE — Patient Instructions (Signed)
[name]  12/29/2021     @   Your procedure is scheduled on 01/04/22.  Report to [facility] at 0830 A.M.  Call this number if you have problems the morning of surgery:  [phone]   Remember:  Do not eat or drink after midnight.                     Take these medicines the morning of surgery with A SIP OF WATER none    Do not wear jewelry, make-up or nail polish.  Do not wear lotions, powders, or perfumes, or deodorant.  Do not shave 48 hours prior to surgery.  Men may shave face and neck.  Do not bring valuables to the hospital.  [facility] is not responsible for any belongings or valuables.  Contacts, dentures or bridgework may not be worn into surgery.  Leave your suitcase in the car.  After surgery it may be brought to your room.  For patients admitted to the hospital, discharge time will be determined by your treatment team.  Patients discharged the day of surgery will not be allowed to drive home.   Name and phone number of your driver:   family Special instructions:    Please read over the following fact sheets that you were given. Coughing and Deep Breathing, Surgical Site Infection Prevention, Anesthesia Post-op Instructions, and Care and Recovery After Surgery      How to Use Chlorhexidine Before Surgery Chlorhexidine gluconate (CHG) is a germ-killing (antiseptic) solution that is used to clean the skin. It can get rid of the bacteria that normally live on the skin and can keep them away for about 24 hours. To clean your skin with CHG, you may be given: A CHG solution to use in the shower or as part of a sponge bath. A prepackaged cloth that contains CHG. Cleaning your skin with CHG may help lower the risk for infection: While you are staying in the intensive care unit of the hospital. If you have a vascular access, such as a central line, to provide short-term or long-term access to your veins. If you have a catheter to drain  urine from your bladder. If you are on a ventilator. A ventilator is a machine that helps you breathe by moving air in and out of your lungs. After surgery. What are the risks? Risks of using CHG include: A skin reaction. Hearing loss, if CHG gets in your ears and you have a perforated eardrum. Eye injury, if CHG gets in your eyes and is not rinsed out. The CHG product catching fire. Make sure that you avoid smoking and flames after applying CHG to your skin. Do not use CHG: If you have a chlorhexidine allergy or have previously reacted to chlorhexidine. On babies younger than 39 months of age. How to use CHG solution Use CHG only as told by your health care provider, and follow the instructions on the label. Use the full amount of CHG as directed. Usually, this is one bottle. During a shower Follow these steps when using CHG solution during a shower (unless your health care provider gives you different instructions): Start the shower. Use your normal soap and shampoo to wash your face and hair. Turn off the shower or move out of the shower stream. Pour the CHG onto a clean washcloth. Do not use any type of brush or rough-edged sponge. Starting at your neck, lather your body down to your toes. Make  sure you follow these instructions: If you will be having surgery, pay special attention to the part of your body where you will be having surgery. Scrub this area for at least 1 minute. Do not use CHG on your head or face. If the solution gets into your ears or eyes, rinse them well with water. Avoid your genital area. Avoid any areas of skin that have broken skin, cuts, or scrapes. Scrub your back and under your arms. Make sure to wash skin folds. Let the lather sit on your skin for 1-2 minutes or as long as told by your health care provider. Thoroughly rinse your entire body in the shower. Make sure that all body creases and crevices are rinsed well. Dry off with a clean towel. Do not put  any substances on your body afterward--such as powder, lotion, or perfume--unless you are told to do so by your health care provider. Only use lotions that are recommended by the manufacturer. Put on clean clothes or pajamas. If it is the night before your surgery, sleep in clean sheets.  During a sponge bath Follow these steps when using CHG solution during a sponge bath (unless your health care provider gives you different instructions): Use your normal soap and shampoo to wash your face and hair. Pour the CHG onto a clean washcloth. Starting at your neck, lather your body down to your toes. Make sure you follow these instructions: If you will be having surgery, pay special attention to the part of your body where you will be having surgery. Scrub this area for at least 1 minute. Do not use CHG on your head or face. If the solution gets into your ears or eyes, rinse them well with water. Avoid your genital area. Avoid any areas of skin that have broken skin, cuts, or scrapes. Scrub your back and under your arms. Make sure to wash skin folds. Let the lather sit on your skin for 1-2 minutes or as long as told by your health care provider. Using a different clean, wet washcloth, thoroughly rinse your entire body. Make sure that all body creases and crevices are rinsed well. Dry off with a clean towel. Do not put any substances on your body afterward--such as powder, lotion, or perfume--unless you are told to do so by your health care provider. Only use lotions that are recommended by the manufacturer. Put on clean clothes or pajamas. If it is the night before your surgery, sleep in clean sheets. How to use CHG prepackaged cloths Only use CHG cloths as told by your health care provider, and follow the instructions on the label. Use the CHG cloth on clean, dry skin. Do not use the CHG cloth on your head or face unless your health care provider tells you to. When washing with the CHG cloth: Avoid  your genital area. Avoid any areas of skin that have broken skin, cuts, or scrapes. Before surgery Follow these steps when using a CHG cloth to clean before surgery (unless your health care provider gives you different instructions): Using the CHG cloth, vigorously scrub the part of your body where you will be having surgery. Scrub using a back-and-forth motion for 3 minutes. The area on your body should be completely wet with CHG when you are done scrubbing. Do not rinse. Discard the cloth and let the area air-dry. Do not put any substances on the area afterward, such as powder, lotion, or perfume. Put on clean clothes or pajamas. If it is the  night before your surgery, sleep in clean sheets.  For general bathing Follow these steps when using CHG cloths for general bathing (unless your health care provider gives you different instructions). Use a separate CHG cloth for each area of your body. Make sure you wash between any folds of skin and between your fingers and toes. Wash your body in the following order, switching to a new cloth after each step: The front of your neck, shoulders, and chest. Both of your arms, under your arms, and your hands. Your stomach and groin area, avoiding the genitals. Your right leg and foot. Your left leg and foot. The back of your neck, your back, and your buttocks. Do not rinse. Discard the cloth and let the area air-dry. Do not put any substances on your body afterward--such as powder, lotion, or perfume--unless you are told to do so by your health care provider. Only use lotions that are recommended by the manufacturer. Put on clean clothes or pajamas. Contact a health care provider if: Your skin gets irritated after scrubbing. You have questions about using your solution or cloth. You swallow any chlorhexidine. Call your local poison control center [phone] in the [place] Get help right away if: Your eyes itch badly, or they become very red or  swollen. Your skin itches badly and is red or swollen. Your hearing changes. You have trouble seeing. You have swelling or tingling in your mouth or throat. You have trouble breathing. These symptoms may represent a serious problem that is an emergency. Do not wait to see if the symptoms will go away. Get medical help right away. Call your local emergency services (911 in the [place] Do not drive yourself to the hospital. Summary Chlorhexidine gluconate (CHG) is a germ-killing (antiseptic) solution that is used to clean the skin. Cleaning your skin with CHG may help to lower your risk for infection. You may be given CHG to use for bathing. It may be in a bottle or in a prepackaged cloth to use on your skin. Carefully follow your health care provider's instructions and the instructions on the product label. Do not use CHG if you have a chlorhexidine allergy. Contact your health care provider if your skin gets irritated after scrubbing. This information is not intended to replace advice given to you by your health care provider. Make sure you discuss any questions you have with your health care provider. Document Revised: 07/19/2021 Document Reviewed: 06/01/2020 Elsevier Patient Education  2023 [employer] General Anesthesia, Adult General anesthesia is the use of medicine to make you fall asleep (unconscious) for a medical procedure. General anesthesia must be used for certain procedures. It is often recommended for surgery or procedures that: Last a long time. Require you to be still or in an unusual position. Are major and can cause blood loss. Affect your breathing. The medicines used for general anesthesia are called general anesthetics. During general anesthesia, these medicines are given along with medicines that: Prevent pain. Control your blood pressure. Relax your muscles. Prevent nausea and vomiting after the procedure. Tell a health care provider about: Any allergies you  have. All medicines you are taking, including vitamins, herbs, eye drops, creams, and over-the-counter medicines. Your history of any: Medical conditions you have, including: High blood pressure. Bleeding problems. Diabetes. Heart or lung conditions, such as: Heart failure. Sleep apnea. Asthma. Chronic obstructive pulmonary disease (COPD). Current or recent illnesses, such as: Upper respiratory, chest, or ear infections. Cough or fever. Tobacco or drug use, including  marijuana or alcohol use. Depression or anxiety. Surgeries and types of anesthetics you have had. Problems you or family members have had with anesthetic medicines. Whether you are pregnant or may be pregnant. Whether you have any chipped or loose teeth, dentures, caps, bridgework, or issues with your mouth, swallowing, or choking. What are the risks? Your health care provider will talk with you about risks. These may include: Allergic reaction to the medicines. Lung and heart problems. Inhaling food or liquid from the stomach into the lungs (aspiration). Nerve injury. Injury to the lips, mouth, teeth, or gums. Stroke. Waking up during your procedure and being unable to move. This is rare. These problems are more likely to develop if you are having a major surgery or if you have an advanced or serious medical condition. You can prevent some of these complications by answering all of your health care provider's questions thoroughly and by following all instructions before your procedure. General anesthesia can cause side effects, including: Nausea or vomiting. A sore throat or hoarseness from the breathing tube. Wheezing or coughing. Shaking chills or feeling cold. Body aches. Sleepiness. Confusion, agitation (delirium), or anxiety. What happens before the procedure? When to stop eating and drinking Follow instructions from your health care provider about what you may eat and drink before your procedure. If you  do not follow your health care provider's instructions, your procedure may be delayed or canceled. Medicines Ask your health care provider about: Changing or stopping your regular medicines. These include any diabetes medicines or blood thinners you take. Taking medicines such as aspirin and ibuprofen. These medicines can thin your blood. Do not take them unless your health care provider tells you to. Taking over-the-counter medicines, vitamins, herbs, and supplements. General instructions Do not use any products that contain nicotine or tobacco for at least 4 weeks before the procedure. These products include cigarettes, chewing tobacco, and vaping devices, such as e-cigarettes. If you need help quitting, ask your health care provider. If you brush your teeth on the morning of the procedure, make sure to spit out all of the water and toothpaste. If told by your health care provider, bring your sleep apnea device with you to surgery (if applicable). If you will be going home right after the procedure, plan to have a responsible adult: Take you home from the hospital or clinic. You will not be allowed to drive. Care for you for the time you are told. What happens during the procedure?  An IV will be inserted into one of your veins. You will be given one or more of the following through a face mask or IV: A sedative. This helps you relax. Anesthesia. This will: Numb certain areas of your body. Make you fall asleep for surgery. After you are unconscious, a breathing tube may be inserted down your throat to help you breathe. This will be removed before you wake up. An anesthesia provider, such as an anesthesiologist, will stay with you throughout your procedure. The anesthesia provider will: Keep you comfortable and safe by continuing to give you medicines and adjusting the amount of medicine that you get. Monitor your blood pressure, heart rate, and oxygen levels to make sure that the  anesthetics do not cause any problems. The procedure may vary among health care providers and hospitals. What happens after the procedure? Your blood pressure, temperature, heart rate, breathing rate, and blood oxygen level will be monitored until you leave the hospital or clinic. You will wake up in a  recovery area. You may wake up slowly. You may be given medicine to help you with pain, nausea, or any other side effects from the anesthesia. Summary General anesthesia is the use of medicine to make you fall asleep (unconscious) for a medical procedure. Follow your health care provider's instructions about when to stop eating, drinking, or taking certain medicines before your procedure. Plan to have a responsible adult take you home from the hospital or clinic. This information is not intended to replace advice given to you by your health care provider. Make sure you discuss any questions you have with your health care provider. Document Revised: 06/17/2021 Document Reviewed: 06/17/2021 Elsevier Patient Education  2023 [employer] PATIENT INSTRUCTIONS POST-ANESTHESIA  IMMEDIATELY FOLLOWING SURGERY:  Do not drive or operate machinery for the first twenty four hours after surgery.  Do not make any important decisions for twenty four hours after surgery or while taking narcotic pain medications or sedatives.  If you develop intractable nausea and vomiting or a severe headache please notify your doctor immediately.  FOLLOW-UP:  Please make an appointment with your surgeon as instructed. You do not need to follow up with anesthesia unless specifically instructed to do so.  WOUND CARE INSTRUCTIONS (if applicable):  Keep a dry clean dressing on the anesthesia/puncture wound site if there is drainage.  Once the wound has quit draining you may leave it open to air.  Generally you should leave the bandage intact for twenty four hours unless there is drainage.  If the epidural site drains for more than  36-48 hours please call the anesthesia department.  QUESTIONS?:  Please feel free to call your physician or the hospital operator if you have any questions, and they will be happy to assist you.      Endometrial Ablation Endometrial ablation is a procedure that destroys the thin inner layer of the lining of the uterus (endometrium). This procedure may be done: To stop heavy menstrual periods. To stop bleeding that is causing anemia. To control irregular bleeding. To treat bleeding caused by small tumors (fibroids) in the endometrium. This procedure is often done as an alternative to major surgery, such as removal of the uterus and cervix (hysterectomy). As a result of this procedure: You may not be able to have children. However, if you have not yet gone through menopause: You may still have a small chance of getting pregnant. You will need to use a reliable method of birth control after the procedure to prevent pregnancy. You may stop having a menstrual period, or you may have only a small amount of bleeding during your period. Menstruation may return several years after the procedure. Tell a health care provider about: Any allergies you have. All medicines you are taking, including vitamins, herbs, eye drops, creams, and over-the-counter medicines. Any problems you or family members have had with the use of anesthetic medicines. Any blood disorders you have. Any surgeries you have had. Any medical conditions you have. Whether you are pregnant or may be pregnant. What are the risks? Generally, this is a safe procedure. However, problems may occur, including: A hole (perforation) in the uterus or bowel. Infection in the uterus, bladder, or vagina. Bleeding. Allergic reaction to medicines. Damage to nearby structures or organs. An air bubble in the lung (air embolus). Problems with pregnancy. Failure of the procedure. Decreased ability to diagnose cancer in the endometrium. Scar  tissue forms after the procedure, making it more difficult to get a sample of the uterine lining.  What happens before the procedure? Medicines Ask your health care provider about: Changing or stopping your regular medicines. This is especially important if you take diabetes medicines or blood thinners. Taking medicines such as aspirin and ibuprofen. These medicines can thin your blood. Do not take these medicines before your procedure if your doctor tells you not to take them. Taking over-the-counter medicines, vitamins, herbs, and supplements. Tests You will have tests of your endometrium to make sure there are no precancerous cells or cancer cells present. You may have an ultrasound of the uterus. General instructions Do not use any products that contain nicotine or tobacco for at least 4 weeks before the procedure. These include cigarettes, chewing tobacco, and vaping devices, such as e-cigarettes. If you need help quitting, ask your health care provider. You may be given medicines to thin the endometrium. Ask your health care provider what steps will be taken to help prevent infection. These steps may include: Removing hair at the surgery site. Washing skin with a germ-killing soap. Taking antibiotic medicine. Plan to have a responsible adult take you home from the hospital or clinic. Plan to have a responsible adult care for you for the time you are told after you leave the hospital or clinic. This is important. What happens during the procedure?  You will lie on an exam table with your feet and legs supported as in a pelvic exam. An IV will be inserted into one of your veins. You will be given a medicine to help you relax (sedative). A surgical tool with a light and camera (resectoscope) will be inserted into your vagina and moved into your uterus. This allows your surgeon to see inside your uterus. Endometrial tissue will be destroyed and removed, using one of the following  methods: Radiofrequency. This uses an electrical current to destroy the endometrium. Cryotherapy. This uses extreme cold to freeze the endometrium. Heated fluid. This uses a heated salt and water (saline) solution to destroy the endometrium. Microwave. This uses high-energy microwaves to heat up the endometrium and destroy it. Thermal balloon. This involves inserting a catheter with a balloon tip into the uterus. The balloon tip is filled with heated fluid to destroy the endometrium. The procedure may vary among health care providers and hospitals. What happens after the procedure? Your blood pressure, heart rate, breathing rate, and blood oxygen level will be monitored until you leave the hospital or clinic. You may have vaginal bleeding for 4-6 weeks after the procedure. You may also have: Cramps. A thin, watery vaginal discharge that is light pink or brown. A need to urinate more than usual. Nausea. If you were given a sedative during the procedure, it can affect you for several hours. Do not drive or operate machinery until your health care provider says that it is safe. Do not have sex or insert anything into your vagina until your health care provider says it is safe. Summary Endometrial ablation is done to treat many causes of heavy menstrual bleeding. The procedure destroys the thin inner layer of the lining of the uterus (endometrium). This procedure is often done as an alternative to major surgery, such as removal of the uterus and cervix (hysterectomy). Plan to have a responsible adult take you home from the hospital or clinic. This information is not intended to replace advice given to you by your health care provider. Make sure you discuss any questions you have with your health care provider. Document Revised: 10/10/2019 Document Reviewed: 10/10/2019 Elsevier Patient Education  2023 [employer] Hysteroscopy Hysteroscopy is a procedure used to look inside a woman's womb  (uterus). This may be done for various reasons, including: To look for tumors and other growths in the uterus. To evaluate abnormal bleeding, fibroid tumors, polyps, scar tissue, or uterine cancer. To determine why a woman is unable to get pregnant or has had repeated pregnancy losses (miscarriages). To locate an intrauterine device (IUD). To place a birth control device into the fallopian tubes. During this procedure, a thin, flexible tube with a small light and camera (hysteroscope) is used to examine the uterus. The camera sends images to a monitor in the room so that your health care provider can view the inside of your uterus. A hysteroscopy should be done right after a menstrual period. Tell a health care provider about: Any allergies you have. All medicines you are taking, including vitamins, herbs, eye drops, creams, and over-the-counter medicines. Any problems you or family members have had with anesthetic medicines. Any bleeding problems you have. Any surgeries you have had. Any medical conditions you have. Whether you are pregnant or may be pregnant. Whether you have been diagnosed with an sexually transmitted infection (STI) or you think you have an STI. What are the risks? Your health care provider will talk with you about risks. These may include: Excessive bleeding. Infection. Damage to the uterus or other structures or organs. Allergic reaction to medicines or fluids that are used in the procedure. What happens before the procedure? When to stop eating and drinking Follow instructions from your health care provider about what you may eat and drink. These may include: 8 hours before your procedure Stop eating most foods. Do not eat meat, fried foods, or fatty foods. Eat only light foods, such as toast or crackers. All liquids are okay except energy drinks and alcohol. 6 hours before your procedure Stop eating. Drink only clear liquids, such as water, clear fruit juice,  black coffee, plain tea, and sports drinks. Do not drink energy drinks or alcohol. 2 hours before your procedure Stop drinking all liquids. You may be allowed to take medicines with small sips of water. If you do not follow your health care provider's instructions, your procedure may be delayed or canceled. Medicines Ask your health care provider about: Changing or stopping your regular medicines. These include any diabetes medicines or blood thinners you take. Taking medicines such as aspirin and ibuprofen. These medicines can thin your blood. Do not take them unless your health care provider tells you to. Taking over-the-counter medicines, vitamins, herbs, and supplements. Medicine may be placed in your cervix the day before the procedure. This medicine causes the cervix to open (dilate). The larger opening makes it easier for the hysteroscope to be inserted into the uterus during the procedure. General instructions Ask your health care provider: What steps will be taken to help prevent infection. These steps may include: Washing skin with a soap that kills germs. Taking antibiotic medicine. Do not use any products that contain nicotine or tobacco for at least 4 weeks before the procedure. These products include cigarettes, chewing tobacco, and vaping devices, such as e-cigarettes. If you need help quitting, ask your health care provider. If you will be going home right after the procedure, plan to have a responsible adult: Take you home from the hospital or clinic. You will not be allowed to drive. Care for you for the time you are told. Empty your bladder before the procedure begins. What happens during the procedure?  An IV will be inserted into one of your veins. You may be given: A sedative. This helps you relax. Anesthesia. This will: Numb certain areas of your body. Make you fall asleep for surgery. A hysteroscope will be inserted through your vagina and into your uterus. Air or  fluid will be used to enlarge your uterus to allow your health care provider to see it better. The amount of fluid used will be carefully checked throughout the procedure. In some cases, tissue may be gently scraped from inside the uterus and sent to a lab for testing (biopsy). The procedure may vary among health care providers and hospitals. What happens after the procedure? Your blood pressure, heart rate, breathing rate, and blood oxygen level will be monitored until you leave the hospital or clinic. You may have cramps. You may be given medicines for this. You may have bleeding, which may vary from light spotting to menstrual-like bleeding. This is normal. If you had a biopsy, it is up to you to get the results. Ask your health care provider, or the department that is doing the procedure, when your results will be ready. Summary Hysteroscopy is a procedure that is used to look inside a woman's womb (uterus). After the procedure, you may have bleeding, which varies from light spotting to menstrual-like bleeding. This is normal. You may also have cramps. Plan to have a responsible adult take you home from the hospital or clinic. If you had a biopsy, it is up to you to get the results. Ask your health care provider, or the department that is doing the procedure, when your results will be ready. This information is not intended to replace advice given to you by your health care provider. Make sure you discuss any questions you have with your health care provider. Document Revised: 07/05/2021 Document Reviewed: 07/05/2021 Elsevier Patient Education  [place]. Dilation and Curettage or Vacuum Curettage Dilation and curettage (D&C) and vacuum curettage are minor procedures. A D&C involves stretching the cervix (dilation) and scraping the inside lining of the uterus, or endometrium, with surgical instruments (curettage). During a D&C, tissue is gently scraped starting from the top of the  uterus down to the lowest part of the uterus. During a vacuum curettage, the lining and tissue in the uterus are removed using gentle suction. Curettage may be performed to either diagnose or treat a problem. A diagnostic curettage may be done if you have: Irregular bleeding or clotting from the uterus. Spotting between menstrual periods, prolonged menstrual periods, or other abnormal bleeding. Bleeding after menopause. No menstrual period (amenorrhea). A change in the size and shape of the uterus. Abnormal endometrial cells discovered during a Pap test. For treatment, curettage may be done: To remove an IUD (intrauterine device). To remove the remaining placenta after giving birth. During an abortion or after a miscarriage. To remove growths in the lining of the uterus. To remove certain rare types of noncancerous lumps (fibroids). Tell a health care provider about: Any allergies you have, including allergies to prescribed medicine or latex. All medicines you are taking, including vitamins, herbs, eye drops, creams, and over-the-counter medicines. Any problems you or family members have had with anesthetic medicines. Any blood disorders you have. Any surgeries you have had. Your medical history and any medical conditions you have. Whether you are pregnant or may be pregnant. Recent vaginal infections you have had. Recent menstrual periods, bleeding problems you have had, and what form of birth control (contraception) you use. What  are the risks? Generally, this is a safe procedure. However, problems may occur, including: Infection. Heavy vaginal bleeding. Allergic reactions to medicines. Damage to the cervix or nearby structures or organs. Scar tissue developing inside the uterus. This can cause abnormal periods and may make it harder to get pregnant. A hole (perforation) in the wall of the uterus. This is rare. What happens before the procedure? Staying hydrated Follow  instructions from your health care provider about hydration, which may include: Up to 2 hours before the procedure - you may continue to drink clear liquids, such as water, clear fruit juice, black coffee, and plain tea.  Eating and drinking restrictions Follow instructions from your health care provider about eating and drinking, which may include: 8 hours before the procedure - stop eating heavy meals or foods, such as meat, fried foods, or fatty foods. 6 hours before the procedure - stop eating light meals or foods, such as toast or cereal. 6 hours before the procedure - stop drinking milk or drinks that contain milk. 2 hours before the procedure - stop drinking clear liquids. If your health care provider told you to take your medicine on the day of your procedure, take them with only a sip of water. Medicines Ask your health care provider about: Changing or stopping your regular medicines. This is especially important if you are taking diabetes medicines or blood thinners. Taking medicines such as aspirin and ibuprofen. These medicines can thin your blood. Do not take these medicines unless your health care provider tells you to take them. Taking over-the-counter medicines, vitamins, herbs, and supplements. You may be given a medicine to soften the cervix. This will help with dilation. Tests You may be given a pregnancy test on the day of the procedure. You may have a blood or urine sample taken. General instructions Do not use any products that contain nicotine or tobacco for at least 4 weeks before the procedure. These products include cigarettes, chewing tobacco, and vaping devices, such as e-cigarettes. If you need help quitting, ask your health care provider. For 24 hours before your procedure: Do not douche, use tampons, or have sex. Do not use medicines, creams, or suppositories in the vagina. Ask your health care provider what steps will be taken to help prevent infection. These  may include: Removing hair at the procedure site. Washing skin with a germ-killing soap. Taking antibiotic medicine. Plan to have a responsible adult take you home from the hospital or clinic. If you will be going home right after the procedure, plan to have a responsible adult care for you for the time you are told. This is important. What happens during the procedure?  An IV will be inserted into one of your veins. You will be given one of the following: A medicine that numbs the area in and around the cervix (local anesthetic). A medicine to make you fall asleep (general anesthetic). You will lie down on your back, with your feet in foot rests (stirrups). The size and position of your uterus will be checked. A lubricated instrument (speculum or Sims retractor) will be inserted into your vagina to widen its walls. This will allow your health care provider to see your cervix. Your cervix will be softened and dilated. This may be done by: Taking medicine by mouth or vaginally. Having thin rods or gradual widening instruments inserted into your cervix. A small, sharp, curved instrument (curette) will be used to scrape a small amount of tissue or cells from the  endometrium or cervical canal. In some cases, gentle suction is applied with the curette. The cells will be taken to a lab for testing. The procedure may vary among health care providers and hospitals. What happens after the procedure? Your blood pressure, heart rate, breathing rate, and blood oxygen level will be monitored until you leave the hospital or clinic. You may have mild cramping, a backache, pain, and light bleeding or spotting. You may pass small blood clots from your vagina. You may have to wear compression stockings. These stockings help to prevent blood clots and reduce swelling in your legs. It is up to you to get the results of your procedure. Ask your health care provider, or the department that is doing the procedure,  when your results will be ready. Summary Dilation and curettage (D&C) involves stretching (dilating) the cervix and scraping the inside lining of the uterus with surgical instruments (curettage). Follow your health care provider's instructions about when to stop eating and drinking before the procedure, and whether to stop or change any medicines. After the procedure, you may have mild cramping, a backache, pain, and light bleeding or spotting. You may pass small blood clots from your vagina. Plan to have a responsible adult take you home from the hospital or clinic. This information is not intended to replace advice given to you by your health care provider. Make sure you discuss any questions you have with your health care provider. Document Revised: 03/11/2020 Document Reviewed: 03/11/2020 Elsevier Patient Education  2023 [employer] Salpingectomy Salpingectomy, also called tubectomy, is the surgical removal of one of the fallopian tubes. The fallopian tubes allow eggs to travel from the ovaries to the uterus. Removing one fallopian tube does not prevent pregnancy. It also does not cause problems with your menstrual periods. You may need this procedure if you: Have an ectopic pregnancy. This is when a fertilized egg attaches to the fallopian tube instead of the uterus. An ectopic pregnancy can cause the tube to burst or tear (rupture). Have an infected fallopian tube. Have cancer of the fallopian tube or nearby organs. Have had an ovary removed due to a cyst or tumor. Have had your uterus removed. Are at high risk for ovarian cancer. There are three different methods that can be used for a salpingectomy: An open method in which one large incision is made in your abdomen. A laparoscopic method in which a thin, lighted tube with a tiny camera (laparoscope) is used to help perform the procedure. The laparoscope allows a surgeon to make several small incisions in the abdomen instead of one large  incision. A robot-assisted method in which a computer is used to control surgical instruments that are attached to robotic arms. Tell a health care provider about: Any allergies you have. All medicines you are taking, including vitamins, herbs, eye drops, creams, and over-the-counter medicines. Any problems you or family members have had with anesthetic medicines. Any blood disorders you have. Any surgeries you have had. Any medical conditions you have. Whether you are pregnant or may be pregnant. What are the risks? Generally, this is a safe procedure. However, problems may occur, including: Infection. Bleeding. Allergic reactions to medicines. Blood clots in the legs or lungs. Damage to nearby structures or organs. What happens before the procedure? Staying hydrated Follow instructions from your health care provider about hydration, which may include: Up to 2 hours before the procedure - you may continue to drink clear liquids, such as water, clear fruit juice, black coffee,  and plain tea. Eating and drinking restrictions Follow instructions from your health care provider about eating and drinking, which may include: 8 hours before the procedure - stop eating heavy meals or foods, such as meat, fried foods, or fatty foods. 6 hours before the procedure - stop eating light meals or foods, such as toast or cereal. 6 hours before the procedure - stop drinking milk or drinks that contain milk. 2 hours before the procedure - stop drinking clear liquids. Medicines Ask your health care provider about: Changing or stopping your regular medicines. This is especially important if you are taking diabetes medicines or blood thinners. Taking medicines such as aspirin and ibuprofen. These medicines can thin your blood. Do not take these medicines unless your health care provider tells you to take them. Taking over-the-counter medicines, vitamins, herbs, and supplements. General instructions Do  not use any products that contain nicotine or tobacco for at least 4 weeks before the procedure. These products include cigarettes, chewing tobacco, and vaping devices, such as e-cigarettes. If you need help quitting, ask your health care provider. You may have an exam or tests, such as an electrocardiogram (ECG) or a blood or urine test. Ask your health care provider: How your surgery site will be marked. What steps will be taken to help prevent infection. These steps may include: Removing hair at the surgery site. Washing skin with a germ-killing soap. Taking antibiotic medicine. Plan to have a responsible adult take you home from the hospital or clinic. If you will be going home right after the procedure, plan to have a responsible adult care for you for the time you are told. This is important. What happens during the procedure? An IV will be inserted into one of your veins. You will be given one or both of the following: A medicine to help you relax (sedative). A medicine to make you fall asleep (general anesthetic). A small, thin tube (catheter) may be inserted through your urethra and into your bladder. This will drain urine during your procedure. Depending on the type of procedure you are having, one incision or several small incisions will be made in your abdomen. Your fallopian tube and ovary will be cut away from the uterus and removed. Your blood vessels will be clamped and tied to prevent excess bleeding. The incision or incisions in your abdomen will be closed with stitches (sutures), staples, or skin glue. A bandage (dressing) may be placed over your incision or incisions. The procedure may vary among health care providers and hospitals. What happens after the procedure?  Your blood pressure, heart rate, breathing rate, and blood oxygen level will be monitored until you leave the hospital or clinic. You may continue to receive fluids and medicines through an IV. You may  continue to have a catheter draining your urine. You may have to wear compression stockings. These stockings help to prevent blood clots and reduce swelling in your legs. You will be given pain medicine as needed. If you were given a sedative during the procedure, it can affect you for several hours. Do not drive or operate machinery until your health care provider says that it is safe. Summary Salpingectomy is a surgical procedure to remove one of the fallopian tubes. The procedure may be done with an open incision, a thin, lighted tube with a tiny camera (laparoscope), or computer-controlled instruments. Depending on the type of procedure you have, one incision or several small incisions will be made in your abdomen. Your blood pressure,  heart rate, breathing rate, and blood oxygen level will be monitored until you leave the hospital or clinic. Plan to have a responsible adult take you home from the hospital or clinic. This information is not intended to replace advice given to you by your health care provider. Make sure you discuss any questions you have with your health care provider. Document Revised: 02/11/2020 Document Reviewed: 02/11/2020 Elsevier Patient Education  2023 [employer].

## 2021-12-30 ENCOUNTER — Telehealth: Payer: Self-pay | Admitting: Student

## 2021-12-30 ENCOUNTER — Ambulatory Visit: Payer: PRIVATE HEALTH INSURANCE | Attending: Student | Admitting: Student

## 2021-12-30 ENCOUNTER — Encounter: Payer: Self-pay | Admitting: Student

## 2021-12-30 ENCOUNTER — Encounter: Payer: Self-pay | Admitting: *Deleted

## 2021-12-30 VITALS — BP 142/82 | HR 88 | Ht 62.5 in | Wt 229.4 lb

## 2021-12-30 DIAGNOSIS — R079 Chest pain, unspecified: Secondary | ICD-10-CM

## 2021-12-30 DIAGNOSIS — Z01810 Encounter for preprocedural cardiovascular examination: Secondary | ICD-10-CM

## 2021-12-30 DIAGNOSIS — R072 Precordial pain: Secondary | ICD-10-CM

## 2021-12-30 NOTE — Progress Notes (Addendum)
Cardiology Office Note    Date:  12/30/2021   ID:  CAHTERINE HEINZEL, DOB 05/09/1985, MRN [id number]  PCP:  Rosalee Kaufman, PA-C  Cardiologist: Jenkins Rouge, MD    Chief Complaint  Patient presents with   Follow-up    Chest Pain    History of Present Illness:    [name] is a 36 y.o. female with past medical history of chest pain, migraine headaches and hyperthyroidism who presents to the office today for evaluation of chest pain.  She was examined by Dr. Johnsie Cancel in 11/2020 and reported having intermittent chest pain and palpitations intermittently for the past month which was not associated with exertion. Reported intermittent dizziness and headaches as well. A Coronary CT was recommended for further evaluation. She did report itching/rash with shellfish in the past but was felt that she did not need to be premedicated. It was recommend that she receive Lopressor 100 mg 2 hours prior to her procedure and obtain a 30-day monitor. Her monitor showed predominantly normal sinus rhythm with no significant arrhythmias. It does not appear her Coronary CTA was obtained.  She called the office on 12/29/2021 reporting intermittent episodes of sharp chest pain along the right side of her chest and radiating to the left side. A follow-up visit was recommended for further evaluation.  In talking with the patient today, she reports having intermittent episodes of chest pain for the past several days which can occur at rest or with activity. Describes as a shooting pain along her right and left pectoral region. She also reports intermittent discomfort down her arms and radiating into her neck since symptoms started. She was previously exercising by walking on the treadmill and also lifting weights without any anginal symptoms but has not exercised over the past few days some symptoms started. Her pain was not worse with positional changes or food consumption. No specific orthopnea, PND or  pitting edema. She is scheduled for a laparoscopic bilingual salpingectomy on 01/04/2022. Reports family members are not screened regularly by PCPs and she is unaware of any specific heart complications they may have. Reports her brother did require heart surgery as a child.   Past Medical History:  Diagnosis Date   Abnormal uterine bleeding (AUB) 89/38/1017   Complication of anesthesia    Depression    Family history of adverse reaction to anesthesia    Fibrocystic breast changes    Hypertension    Hyperthyroidism    IIH (idiopathic intracranial hypertension)    Migraines    Nexplanon in place 01/19/2015   Nexplanon insertion 11/29/2012   inserted nexplanon 11/29/12 in left arm, remove 11/30/15   Obesity    Preterm delivery    Seasonal rhinitis    UTI (lower urinary tract infection)     Past Surgical History:  Procedure Laterality Date   CHOLECYSTECTOMY     ORIF TIBIA PLATEAU Left 07/01/2015   Procedure: OPEN REDUCTION INTERNAL FIXATION (ORIF) TIBIAL PLATEAU AND BONE GRAFT LEFT TIBIAL PLATEAU;  Surgeon: Carole Civil, MD;  Location: [facility];  Service: Orthopedics;  Laterality: Left;   TONSILLECTOMY      Current Medications: Outpatient Medications Prior to Visit  Medication Sig Dispense Refill   acetaZOLAMIDE ER (DIAMOX) 500 MG capsule Take 500 mg by mouth 2 (two) times daily.     Carboxymethylcell-Glycerin PF (REFRESH RELIEVA PF) 0.5-1 % SOLN Place 1 drop into both eyes 4 (four) times daily.     etonogestrel (NEXPLANON) 68 MG IMPL implant Inject  1 each into the skin once.     ibuprofen (ADVIL) 200 MG tablet Take 400 mg by mouth every 6 (six) hours as needed for moderate pain.     megestrol (MEGACE) 40 MG tablet TAKE 1-2 TABLETS BY MOUTH DAILY FOR BLEEDING. (Patient taking differently: Take 40 mg by mouth 2 (two) times daily as needed (heavy bleeding).) 60 tablet 0   Multiple Vitamin (MULTIVITAMIN WITH MINERALS) TABS tablet Take 1 tablet by mouth daily.     No  facility-administered medications prior to visit.     Allergies:   Hydrocodone, Shellfish allergy, Codeine, and Dilaudid [hydromorphone hcl]   Social History   Socioeconomic History   Marital status: Married    Spouse name: Not on file   Number of children: 2   Years of education: Not on file   Highest education level: Not on file  Occupational History   Not on file  Tobacco Use   Smoking status: Never   Smokeless tobacco: Never  Vaping Use   Vaping Use: Never used  Substance and Sexual Activity   Alcohol use: No   Drug use: No   Sexual activity: Yes    Birth control/protection: Implant  Other Topics Concern   Not on file  Social History Narrative   Not on file   Social Determinants of Health   Financial Resource Strain: Low Risk  (02/12/2021)   Overall Financial Resource Strain (CARDIA)    Difficulty of Paying Living Expenses: Not hard at all  Food Insecurity: No Food Insecurity (02/12/2021)   Hunger Vital Sign    Worried About Running Out of Food in the Last Year: Never true    [place] in the Last Year: Never true  Transportation Needs: No Transportation Needs (02/12/2021)   PRAPARE - Hydrologist (Medical): No    Lack of Transportation (Non-Medical): No  Physical Activity: Insufficiently Active (02/12/2021)   Exercise Vital Sign    Days of Exercise per Week: 3 days    Minutes of Exercise per Session: 20 min  Stress: Stress Concern Present (02/12/2021)   [place]    Feeling of Stress : To some extent  Social Connections: Moderately Integrated (02/12/2021)   Social Connection and Isolation Panel [NHANES]    Frequency of Communication with Friends and Family: More than three times a week    Frequency of Social Gatherings with Friends and Family: Once a week    Attends Religious Services: 1 to 4 times per year    Active Member of [employer] or Organizations: No     Attends Music therapist: Never    Marital Status: Married     Family History:  The patient's family history includes Diabetes in her paternal grandfather and paternal grandmother; Heart disease in her paternal grandfather; Hypertension in her father, maternal grandfather, maternal grandmother, mother, paternal grandfather, and paternal grandmother.   Review of Systems:    Please see the history of present illness.     All other systems reviewed and are otherwise negative except as noted above.   Physical Exam:    VS:  BP (!) 142/82   Pulse 88   Ht 5' 2.5" (1.588 m)   Wt 229 lb 6.4 oz (104.1 kg)   SpO2 99%   BMI 41.29 kg/m    General: Well developed, well nourished,female appearing in no acute distress. Head: Normocephalic, atraumatic. Neck: No carotid bruits. JVD not elevated.  Lungs: Respirations regular and unlabored, without wheezes or rales.  Heart: Regular rate and rhythm. No S3 or S4.  No murmur, no rubs, or gallops appreciated. Abdomen: Appears non-distended. No obvious abdominal masses. Msk:  Strength and tone appear normal for age. No obvious joint deformities or effusions. Extremities: No clubbing or cyanosis. No pitting edema.  Distal pedal pulses are 2+ bilaterally. Neuro: Alert and oriented X 3. Moves all extremities spontaneously. No focal deficits noted. Psych:  Responds to questions appropriately with a normal affect. Skin: No rashes or lesions noted  Wt Readings from Last 3 Encounters:  12/30/21 229 lb 6.4 oz (104.1 kg)  11/08/21 227 lb (103 kg)  05/06/21 255 lb 3.2 oz (115.8 kg)     Studies/Labs Reviewed:   EKG:  EKG is ordered today. The ekg ordered today demonstrates sinus tachycardia, heart rate 110 with isolated TWI along Lead III which is noted on prior tracings. No acute ST changes.  Recent Labs: No results found for requested labs within last 365 days.   Lipid Panel    Component Value Date/Time   CHOL 130 05/09/2007 1944   TRIG  64 05/09/2007 1944   HDL 43 05/09/2007 1944   CHOLHDL 3.0 Ratio 05/09/2007 1944   VLDL 13 05/09/2007 1944   LDLCALC 74 05/09/2007 1944    Additional studies/ records that were reviewed today include:   Echocardiogram: 04/2019 IMPRESSIONS     1. Left ventricular ejection fraction, by visual estimation, is 60 to  65%. The left ventricle has normal function. There is no left ventricular  hypertrophy.   2. The left ventricle has no regional wall motion abnormalities.   3. Global right ventricle has normal systolic function.The right  ventricular size is normal. No increase in right ventricular wall  thickness.   4. Left atrial size was normal.   5. Right atrial size was normal.   6. The mitral valve is grossly normal. Trivial mitral valve  regurgitation.   7. The tricuspid valve is grossly normal.   8. The tricuspid valve is grossly normal. Tricuspid valve regurgitation  is trivial.   9. The aortic valve is tricuspid. Aortic valve regurgitation is not  visualized.  10. The pulmonic valve was grossly normal. Pulmonic valve regurgitation is  not visualized.  11. TR signal is inadequate for assessing pulmonary artery systolic  pressure.  12. The inferior vena cava is normal in size with greater than 50%  respiratory variability, suggesting right atrial pressure of 3 mmHg.   Event Monitor: 11/2020 NSR No significant arrhythmias  Assessment:    1. Chest pain, unspecified type   2. Precordial pain   3. Preoperative cardiovascular examination      Plan:   In order of problems listed above:  1. Chest Pain with Mixed Features - Her episodes of chest pain have atypical qualities as she describes the pain as a shooting pain which lasts less than a minute and spontaneously resolves. Pain can occur at rest or with activity. However, pain does radiate down her arms and into her neck at times. EKG today shows isolated TWI along Lead III which is noted on prior tracings and no acute  changes. - Reviewed options with the patient including a Coronary CT which was previously recommended by Dr. Johnsie Cancel. She is very hesitant to proceed with this given concerns in regard to taking Lopressor and I reviewed that this would be indicated given her faster resting heart rate. Reviewed additional options such as a Treadmill Myoview and  she seems more comfortable with this. She was previously achieving a HR in the 150's to 160's on the treadmill and should be able to achieve target heart rate. Would not do a GXT alone due to the high-risk of lead artifact.  2. Preoperative Cardiac Clearance - She is scheduled for laparoscopic bilingual salpingectomy on 01/04/2022. Given her recent chest pain, I would recommend ischemic evaluation as outlined above. Will see if this can be scheduled prior to her surgery on Tuesday. Reviewed with the patient that if her cardiac testing is abnormal, this would delay her surgery. Will forward a copy of today's note to her surgeon.     Medication Adjustments/Labs and Tests Ordered: Current medicines are reviewed at length with the patient today.  Concerns regarding medicines are outlined above.  Medication changes, Labs and Tests ordered today are listed in the Patient Instructions below. Patient Instructions  Medication Instructions:   Your physician recommends that you continue on your current medications as directed. Please refer to the Current Medication list given to you today.  *If you need a refill on your cardiac medications before your next appointment, please call your pharmacy*   Lab Work: NONE   If you have labs (blood work) drawn today and your tests are completely normal, you will receive your results only by: [place] (if you have MyChart) OR A paper copy in the mail If you have any lab test that is abnormal or we need to change your treatment, we will call you to review the results.   Testing/Procedures: Your physician has requested  that you have en exercise stress myoview. For further information please visit [email or web address]. Please follow instruction sheet, as given.    Follow-Up: At [facility], you and your health needs are our priority.  As part of our continuing mission to provide you with exceptional heart care, we have created designated Provider Care Teams.  These Care Teams include your primary Cardiologist (physician) and Advanced Practice Providers (APPs -  Physician Assistants and Nurse Practitioners) who all work together to provide you with the care you need, when you need it.  We recommend signing up for the patient portal called "MyChart".  Sign up information is provided on this After Visit Summary.  MyChart is used to connect with patients for Virtual Visits (Telemedicine).  Patients are able to view lab/test results, encounter notes, upcoming appointments, etc.  Non-urgent messages can be sent to your provider as well.   To learn more about what you can do with MyChart, go to [email or web address].    Your next appointment:    Pending test results   The format for your next appointment:   In Person  Provider:   You may see Jenkins Rouge, MD or one of the following Advanced Practice Providers on your designated Care Team:   Bernerd Pho, PA-C  Ermalinda Barrios, PA-C     Other Instructions Thank you for choosing [facility]!    Important Information About Sugar         Signed, Erma Heritage, PA-C  12/30/2021 4:32 PM    [facility] [address] [place], [place] [address] Phone: [phone] Fax: [phone]

## 2021-12-30 NOTE — Telephone Encounter (Signed)
Checking percert on the following patient for testing scheduled at [facility].     EXERCISE STRESS    01-03-2022

## 2021-12-30 NOTE — Patient Instructions (Signed)
Medication Instructions:   Your physician recommends that you continue on your current medications as directed. Please refer to the Current Medication list given to you today.  *If you need a refill on your cardiac medications before your next appointment, please call your pharmacy*   Lab Work: NONE   If you have labs (blood work) drawn today and your tests are completely normal, you will receive your results only by: [place] (if you have MyChart) OR A paper copy in the mail If you have any lab test that is abnormal or we need to change your treatment, we will call you to review the results.   Testing/Procedures: Your physician has requested that you have en exercise stress myoview. For further information please visit [email or web address]. Please follow instruction sheet, as given.    Follow-Up: At [facility], you and your health needs are our priority.  As part of our continuing mission to provide you with exceptional heart care, we have created designated Provider Care Teams.  These Care Teams include your primary Cardiologist (physician) and Advanced Practice Providers (APPs -  Physician Assistants and Nurse Practitioners) who all work together to provide you with the care you need, when you need it.  We recommend signing up for the patient portal called "MyChart".  Sign up information is provided on this After Visit Summary.  MyChart is used to connect with patients for Virtual Visits (Telemedicine).  Patients are able to view lab/test results, encounter notes, upcoming appointments, etc.  Non-urgent messages can be sent to your provider as well.   To learn more about what you can do with MyChart, go to [email or web address].    Your next appointment:    Pending test results   The format for your next appointment:   In Person  Provider:   You may see Jenkins Rouge, MD or one of the following Advanced Practice Providers on your designated Care Team:   Bernerd Pho, PA-C  Ermalinda Barrios, PA-C     Other Instructions Thank you for choosing [facility]!    Important Information About Sugar

## 2021-12-31 ENCOUNTER — Encounter (HOSPITAL_COMMUNITY)
Admission: RE | Admit: 2021-12-31 | Discharge: 2021-12-31 | Disposition: A | Discharge status: Home / Self Care | Payer: PRIVATE HEALTH INSURANCE | Source: Ambulatory Visit | Attending: Obstetrics & Gynecology | Admitting: Obstetrics & Gynecology

## 2021-12-31 ENCOUNTER — Encounter (HOSPITAL_COMMUNITY): Payer: Self-pay

## 2021-12-31 DIAGNOSIS — N939 Abnormal uterine and vaginal bleeding, unspecified: Secondary | ICD-10-CM

## 2021-12-31 DIAGNOSIS — Z01812 Encounter for preprocedural laboratory examination: Secondary | ICD-10-CM | POA: Diagnosis not present

## 2021-12-31 DIAGNOSIS — Z302 Encounter for sterilization: Secondary | ICD-10-CM

## 2021-12-31 HISTORY — DX: Chest pain, unspecified: R07.9

## 2021-12-31 HISTORY — DX: Cardiac arrhythmia, unspecified: I49.9

## 2021-12-31 HISTORY — DX: Tachycardia, unspecified: R00.0

## 2021-12-31 LAB — BASIC METABOLIC PANEL
Anion gap: 8 (ref 5–15)
BUN: 10 mg/dL (ref 6–20)
CO2: 24 mmol/L (ref 22–32)
Calcium: 9.1 mg/dL (ref 8.9–10.3)
Chloride: 106 mmol/L (ref 98–111)
Creatinine, Ser: 0.67 mg/dL (ref 0.44–1.00)
GFR, Estimated: >60 mL/min (ref >60)
Glucose, Bld: 89 mg/dL (ref 70–99)
Potassium: 3.8 mmol/L (ref 3.5–5.1)
Sodium: 138 mmol/L (ref 135–145)

## 2021-12-31 LAB — CBC
HCT: 40.1 % (ref 36.0–46.0)
Hemoglobin: 13.4 g/dL (ref 12.0–15.0)
MCH: 31.2 pg (ref 26.0–34.0)
MCHC: 33.4 g/dL (ref 30.0–36.0)
MCV: 93.5 fL (ref 80.0–100.0)
Platelets: 286 10*3/uL (ref 150–400)
RBC: 4.29 MIL/uL (ref 3.87–5.11)
RDW: 12.2 % (ref 11.5–15.5)
WBC: 8 10*3/uL (ref 4.0–10.5)
nRBC: 0 % (ref 0.0–0.2)

## 2021-12-31 LAB — PREGNANCY, URINE: Preg Test, Ur: NEGATIVE

## 2021-12-31 NOTE — Progress Notes (Signed)
Patient was having chest pains yesterday so she called cardiology to make them aware. They have scheduled her for a stress test on Monday 01/03/2022 to clear her for surgery.  The surgery date is 01/04/2022 for Bil Salpingectomy, D&C, Hydrothermal Ablation.  We will need to follow up on Monday for clearance from cardiology.

## 2022-01-03 ENCOUNTER — Ambulatory Visit (HOSPITAL_COMMUNITY)
Admission: RE | Admit: 2022-01-03 | Discharge: 2022-01-03 | Disposition: A | Discharge status: Home / Self Care | Payer: PRIVATE HEALTH INSURANCE | Source: Ambulatory Visit | Attending: Cardiovascular Disease | Admitting: Cardiovascular Disease

## 2022-01-03 ENCOUNTER — Encounter (HOSPITAL_COMMUNITY)
Admission: RE | Admit: 2022-01-03 | Discharge: 2022-01-03 | Disposition: A | Discharge status: Home / Self Care | Payer: PRIVATE HEALTH INSURANCE | Source: Ambulatory Visit | Attending: Student | Admitting: Student

## 2022-01-03 ENCOUNTER — Telehealth: Payer: Self-pay | Admitting: Cardiovascular Disease

## 2022-01-03 ENCOUNTER — Telehealth: Payer: Self-pay | Admitting: Student

## 2022-01-03 DIAGNOSIS — Z01810 Encounter for preprocedural cardiovascular examination: Secondary | ICD-10-CM | POA: Diagnosis present

## 2022-01-03 DIAGNOSIS — R072 Precordial pain: Secondary | ICD-10-CM | POA: Insufficient documentation

## 2022-01-03 DIAGNOSIS — R079 Chest pain, unspecified: Secondary | ICD-10-CM | POA: Diagnosis present

## 2022-01-03 LAB — NM MYOCAR MULTI W/SPECT W/WALL MOTION / EF
Angina Index: 0
Duke Treadmill Score: 8
Estimated workload: 10.4
Exercise duration (min): 8 min
Exercise duration (sec): 1 s
LV dias vol: 86 mL (ref 46–106)
LV sys vol: 36 mL
MPHR: 185 {beats}/min
Nuc Stress EF: 59 %
Peak HR: 176 {beats}/min
Percent HR: 95 %
RATE: 0.5
RPE: 13
Rest HR: 66 {beats}/min
Rest Nuclear Isotope Dose: 9 mCi
SDS: 0
SRS: 0
SSS: 0
ST Depression (mm): 0 mm
Stress Nuclear Isotope Dose: 30 mCi
TID: 1.1

## 2022-01-03 MED ORDER — REGADENOSON 0.4 MG/5ML IV SOLN
INTRAVENOUS | Status: AC
  Filled 2022-01-03: qty 5

## 2022-01-03 MED ORDER — SODIUM CHLORIDE FLUSH 0.9 % IV SOLN
INTRAVENOUS | Status: AC
  Administered 2022-01-03: 10 mL via INTRAVENOUS
  Filled 2022-01-03: qty 10

## 2022-01-03 MED ORDER — TECHNETIUM TC 99M TETROFOSMIN IV KIT
30.0000 | PACK | Freq: Once | INTRAVENOUS | Status: AC | PRN
  Administered 2022-01-03: 29 via INTRAVENOUS

## 2022-01-03 MED ORDER — TECHNETIUM TC 99M TETROFOSMIN IV KIT
10.0000 | PACK | Freq: Once | INTRAVENOUS | Status: AC | PRN
  Administered 2022-01-03: 9 via INTRAVENOUS

## 2022-01-03 NOTE — Telephone Encounter (Signed)
-----   Message from Erma Heritage, [place] sent at 01/03/2022  4:10 PM EDT ----- Please let the patient know her stress test showed no evidence of blockages and was a low-risk study. Cleared to proceed with her surgery for tomorrow.

## 2022-01-03 NOTE — Telephone Encounter (Signed)
Pt completed stress test today.

## 2022-01-03 NOTE — Telephone Encounter (Signed)
Pt calling because she was supposed to have a stress test this morning however pt had coffee this morning. She states no one informed her she wasn't supposed to have caffeine. She states her procedure is tomorrow and not sure what she should do

## 2022-01-03 NOTE — Telephone Encounter (Signed)
[name] with [facility] is following up, regarding clearance on behalf of Dr. Nelda Marseille. Please confirm whether or not patient is cleared after stress test.

## 2022-01-03 NOTE — Telephone Encounter (Signed)
Pt to have surgery 01/04/22. Please advise.

## 2022-01-03 NOTE — Telephone Encounter (Signed)
Calling to see if the patient test results was read to see if they are cleared for surgery. Please advise

## 2022-01-03 NOTE — H&P (Signed)
[facility] Obstetrics and Gynecology Attending History and Physical  [name] is a 36 y.o. [id number] who presents for scheduled laparoscopic bilateral salpingectomy and hysteroscopy, D&C, hydrothermal ablation, Nexplanon removal due to desires for sterilization and heavy periods.  In review,she has been using the Nexplanon for contraception and notes irregular bleeding.  She is currently on Megace, which has helped to slow her period, but she is still having daily spotting.  Without Nexplanon she reports her periods are "awful"- heavy bleeding with large clots, lasting for 7-8 days.  Significant dysmenorrhea  She desires a permanent option to regulate her period and for contraception.  Denies any abnormal vaginal discharge, fevers, chills, sweats, dysuria, nausea, vomiting, other GI or GU symptoms or other general symptoms.   Of note, pt recently was experiencing chest pain- s/p cardiology work up including stress test which was normal.  Past Medical History:  Diagnosis Date   Abnormal uterine bleeding (AUB) 01/19/2015   Chest pain    Complication of anesthesia    difficult to wake up after anesthesia   Depression    Dysrhythmia    Family history of adverse reaction to anesthesia    Fibrocystic breast changes    Hypertension    Hyperthyroidism    IIH (idiopathic intracranial hypertension)    Migraines    Nexplanon in place 01/19/2015   Nexplanon insertion 11/29/2012   inserted nexplanon 11/29/12 in left arm, remove 11/30/15   Obesity    Preterm delivery    Seasonal rhinitis    Tachycardia    UTI (lower urinary tract infection)    Past Surgical History:  Procedure Laterality Date   CHOLECYSTECTOMY     ORIF TIBIA PLATEAU Left 07/01/2015   Procedure: OPEN REDUCTION INTERNAL FIXATION (ORIF) TIBIAL PLATEAU AND BONE GRAFT LEFT TIBIAL PLATEAU;  Surgeon: [name], MD;  Location: [facility];  Service: Orthopedics;  Laterality: Left;   TONSILLECTOMY     OB History   Gravida Para Term Preterm AB Living  3 2   2 1 2   SAB IAB Ectopic Multiple Live Births  1       2    # Outcome Date GA Lbr Len/2nd Weight Sex Delivery Anes PTL Lv  3 Preterm 10/06/12 [redacted]w[redacted]d 34:05 / 00:36 2576 g M Vag-Spont EPI  LIV     Birth Comments: NICU x 1 week, mother unsure if pt. was on vent. Rx for transaminitis  2 SAB 02/03/12          1 Preterm 2008 [redacted]w[redacted]d  1962 g F    LIV  Patient denies any other pertinent gynecologic issues.  No current facility-administered medications on file prior to encounter.   Current Outpatient Medications on File Prior to Encounter  Medication Sig Dispense Refill   acetaZOLAMIDE ER (DIAMOX) 500 MG capsule Take 500 mg by mouth 2 (two) times daily.     Carboxymethylcell-Glycerin PF (REFRESH RELIEVA PF) 0.5-1 % SOLN Place 1 drop into both eyes 4 (four) times daily.     etonogestrel (NEXPLANON) 68 MG IMPL implant Inject 1 each into the skin once.     ibuprofen (ADVIL) 200 MG tablet Take 400 mg by mouth every 6 (six) hours as needed for moderate pain.     megestrol (MEGACE) 40 MG tablet TAKE 1-2 TABLETS BY MOUTH DAILY FOR BLEEDING. (Patient taking differently: Take 40 mg by mouth 2 (two) times daily as needed (heavy bleeding).) 60 tablet 0   Multiple Vitamin (MULTIVITAMIN WITH MINERALS) TABS tablet Take 1  tablet by mouth daily.     Allergies  Allergen Reactions   Hydrocodone     Doesn't feel right   Shellfish Allergy Anaphylaxis, Hives and Shortness Of Breath    Throat swelling   Codeine Itching and Other (See Comments)    Does not like the way it makes her feel.   Dilaudid [Hydromorphone Hcl] Hives and Itching    Social History:   reports that she has never smoked. She has never used smokeless tobacco. She reports that she does not drink alcohol and does not use drugs. Family History  Problem Relation Age of Onset   Hypertension Mother    Hypertension Father    Hypertension Maternal Grandmother    Hypertension Maternal Grandfather    Diabetes  Paternal Grandmother    Hypertension Paternal Grandmother    Heart disease Paternal Grandfather    Diabetes Paternal Grandfather    Hypertension Paternal Grandfather     Review of Systems: Pertinent items noted in HPI and remainder of comprehensive ROS otherwise negative.  PHYSICAL EXAM: Last menstrual period 12/24/2021. BP 120/78 (BP Location: Left Arm)   Pulse 88   Temp 98.4 F (36.9 C)   Resp 20   LMP 12/24/2021   SpO2 100%   CONSTITUTIONAL: Well-developed, well-nourished female in no acute distress.  SKIN: Skin is warm and dry. No rash noted. Not diaphoretic. No erythema. No pallor. NEUROLOGIC: Alert and oriented to person, place, and time. Normal reflexes, muscle tone coordination. No cranial nerve deficit noted. PSYCHIATRIC: Normal mood and affect. Normal behavior. Normal judgment and thought content. CARDIOVASCULAR: Normal heart rate noted, regular rhythm RESPIRATORY: Effort and breath sounds normal, no problems with respiration noted ABDOMEN: Soft, nontender, nondistended. PELVIC: deferred MUSCULOSKELETAL: no calf tenderness bilaterally EXT: no edema bilaterally, normal pulses  Labs: Results for orders placed or performed during the hospital encounter of 12/31/21 (from the past 336 hour(s))  Pregnancy, urine   Collection Time: 12/31/21  9:44 AM  Result Value Ref Range   Preg Test, Ur NEGATIVE NEGATIVE  CBC   Collection Time: 12/31/21 10:00 AM  Result Value Ref Range   WBC 8.0 4.0 - 10.5 K/uL   RBC 4.29 3.87 - 5.11 MIL/uL   Hemoglobin 13.4 12.0 - 15.0 g/dL   HCT 40.1 36.0 - 46.0 %   MCV 93.5 80.0 - 100.0 fL   MCH 31.2 26.0 - 34.0 pg   MCHC 33.4 30.0 - 36.0 g/dL   RDW 12.2 11.5 - 15.5 %   Platelets 286 150 - 400 K/uL   nRBC 0.0 0.0 - 0.2 %  Basic metabolic panel   Collection Time: 12/31/21 10:00 AM  Result Value Ref Range   Sodium 138 135 - 145 mmol/L   Potassium 3.8 3.5 - 5.1 mmol/L   Chloride 106 98 - 111 mmol/L   CO2 24 22 - 32 mmol/L   Glucose, Bld 89  70 - 99 mg/dL   BUN 10 6 - 20 mg/dL   Creatinine, Ser 0.67 0.44 - 1.00 mg/dL   Calcium 9.1 8.9 - 10.3 mg/dL   GFR, Estimated >60 >60 mL/min   Anion gap 8 5 - 15  Results for orders placed or performed in visit on 12/30/21 (from the past 336 hour(s))  NM Myocar Multi W/Spect W/Wall Motion / EF   Collection Time: 01/03/22  1:56 PM  Result Value Ref Range   Angina Index 0    Rest HR 66.0 bpm   Rest BP 131/73 mmHg   Exercise duration (min)  8 min   Exercise duration (sec) 1 sec   Estimated workload 10.4    Peak HR 176 bpm   Peak BP 170/93 mmHg   MPHR 185 bpm   Percent HR 95.0 %   RPE 13.0    Rest Nuclear Isotope Dose 9.0 mCi   Stress Nuclear Isotope Dose 30.0 mCi   SSS 0.0    SRS 0.0    SDS 0.0    TID 1.10    LV sys vol 36.0 mL   LV dias vol 86.0 46 - 106 mL   RATE 0.5    Nuc Stress EF 59 %   Duke Treadmill Score 8    ST Depression (mm) 0 mm    Imaging Studies: NM Myocar Multi W/Spect W/Wall Motion / EF  Result Date: 01/03/2022   The study is normal. The study is low risk.   No ST deviation was noted.   Left ventricular function is normal. End diastolic cavity size is normal.    Assessment: Desires permanent sterilization Heavy menstrual bleeding Nexplanon removal   Plan: Hysteroscopy, D&C,  Hydrothermal ablation Laparoscopic bilateral salpingectomy -Ancef 2g IV -NPO -LR @ 125cc/hr -SCDs to OR -Risk/benefits and alternatives reviewed with the patient including but not limited to risk of bleeding, infection and injury to surrounding organs.  Questions and concerns were addressed and pt desires to proceed  Myna Hidalgo, DO Attending Obstetrician & Gynecologist, [facility] for [facility], [facility]

## 2022-01-03 NOTE — Telephone Encounter (Signed)
Message routed to provider to read stress test.

## 2022-01-03 NOTE — Telephone Encounter (Signed)
Patient was given letter outlining protocol for stress test at appt with provider.   Instructions regarding medication: How to prepare for your stress test: DO NOT eat or drink 6 hours prior to your arrival time.  This includes no caffeine (coffee, tea, sodas, chocolate) if you were instructed to take your medications, drink water with it. DO NOT use any tobacco products for at least 8 hours prior to arrival. DO NOT wear dresses or any clothing that may have metal clasps or buttons. Wear short sleeve shirts, loose clothing, and comfortable walking shoes. DO NOT use lotions, oils or powder on your chest before the test. The test will take approximately 3-4 hours from the time you arrive until completion. To register the day of the test, go to the Main Entrance at [facility]. If you must cancel your test, call [phone] as soon as you are aware.

## 2022-01-03 NOTE — Telephone Encounter (Signed)
Left a message for patient to call office back regarding instructions for stress test.

## 2022-01-03 NOTE — Telephone Encounter (Signed)
Patient notified and verbalized understanding. Study routed to Dr. Nelda Marseille.

## 2022-01-04 ENCOUNTER — Ambulatory Visit (HOSPITAL_COMMUNITY): Payer: PRIVATE HEALTH INSURANCE | Admitting: Anesthesiology

## 2022-01-04 ENCOUNTER — Other Ambulatory Visit: Payer: Self-pay

## 2022-01-04 ENCOUNTER — Encounter (HOSPITAL_COMMUNITY)
Admission: RE | Disposition: A | Discharge status: Home / Self Care | Payer: Self-pay | Source: Home / Self Care | Attending: Obstetrics & Gynecology

## 2022-01-04 ENCOUNTER — Ambulatory Visit (HOSPITAL_COMMUNITY)
Admission: RE | Admit: 2022-01-04 | Discharge: 2022-01-04 | Disposition: A | Discharge status: Home / Self Care | Payer: PRIVATE HEALTH INSURANCE | Attending: Obstetrics & Gynecology | Admitting: Obstetrics & Gynecology

## 2022-01-04 ENCOUNTER — Ambulatory Visit (HOSPITAL_BASED_OUTPATIENT_CLINIC_OR_DEPARTMENT_OTHER): Payer: PRIVATE HEALTH INSURANCE | Admitting: Anesthesiology

## 2022-01-04 DIAGNOSIS — R000 Tachycardia, unspecified: Secondary | ICD-10-CM | POA: Insufficient documentation

## 2022-01-04 DIAGNOSIS — N939 Abnormal uterine and vaginal bleeding, unspecified: Secondary | ICD-10-CM

## 2022-01-04 DIAGNOSIS — N946 Dysmenorrhea, unspecified: Secondary | ICD-10-CM | POA: Diagnosis not present

## 2022-01-04 DIAGNOSIS — R519 Headache, unspecified: Secondary | ICD-10-CM | POA: Insufficient documentation

## 2022-01-04 DIAGNOSIS — E0590 Thyrotoxicosis, unspecified without thyrotoxic crisis or storm: Secondary | ICD-10-CM | POA: Diagnosis not present

## 2022-01-04 DIAGNOSIS — Z4589 Encounter for adjustment and management of other implanted devices: Secondary | ICD-10-CM | POA: Diagnosis not present

## 2022-01-04 DIAGNOSIS — N841 Polyp of cervix uteri: Secondary | ICD-10-CM | POA: Diagnosis not present

## 2022-01-04 DIAGNOSIS — N926 Irregular menstruation, unspecified: Secondary | ICD-10-CM

## 2022-01-04 DIAGNOSIS — F32A Depression, unspecified: Secondary | ICD-10-CM | POA: Insufficient documentation

## 2022-01-04 DIAGNOSIS — I10 Essential (primary) hypertension: Secondary | ICD-10-CM

## 2022-01-04 DIAGNOSIS — Z302 Encounter for sterilization: Secondary | ICD-10-CM | POA: Diagnosis present

## 2022-01-04 DIAGNOSIS — N920 Excessive and frequent menstruation with regular cycle: Secondary | ICD-10-CM | POA: Insufficient documentation

## 2022-01-04 DIAGNOSIS — Z3046 Encounter for surveillance of implantable subdermal contraceptive: Secondary | ICD-10-CM

## 2022-01-04 HISTORY — PX: DILITATION & CURRETTAGE/HYSTROSCOPY WITH HYDROTHERMAL ABLATION: SHX5570

## 2022-01-04 HISTORY — PX: LAPAROSCOPIC BILATERAL SALPINGECTOMY: SHX5889

## 2022-01-04 HISTORY — PX: REMOVAL OF NON VAGINAL CONTRACEPTIVE DEVICE: SHX6219

## 2022-01-04 SURGERY — SALPINGECTOMY, BILATERAL, LAPAROSCOPIC
Anesthesia: General | Site: Cervix

## 2022-01-04 MED ORDER — KETOROLAC TROMETHAMINE 15 MG/ML IJ SOLN: 30.0000 mg | INTRAMUSCULAR | Status: DC

## 2022-01-04 MED ORDER — LACTATED RINGERS IV SOLN: INTRAVENOUS | Status: DC

## 2022-01-04 MED ORDER — DOCUSATE SODIUM 100 MG PO CAPS: 100.0000 mg | ORAL_CAPSULE | Freq: Two times a day (BID) | ORAL | 0 refills | Status: AC

## 2022-01-04 MED ORDER — CHLORHEXIDINE GLUCONATE 0.12 % MT SOLN
15.0000 mL | Freq: Once | OROMUCOSAL | Status: AC
  Administered 2022-01-04: 15 mL via OROMUCOSAL

## 2022-01-04 MED ORDER — PROPOFOL 10 MG/ML IV BOLUS
INTRAVENOUS | Status: AC
  Filled 2022-01-04: qty 20

## 2022-01-04 MED ORDER — ROCURONIUM BROMIDE 10 MG/ML (PF) SYRINGE
PREFILLED_SYRINGE | INTRAVENOUS | Status: AC
  Filled 2022-01-04: qty 10

## 2022-01-04 MED ORDER — IBUPROFEN 600 MG PO TABS: 600.0000 mg | ORAL_TABLET | Freq: Four times a day (QID) | ORAL | 0 refills | Status: DC | PRN

## 2022-01-04 MED ORDER — FENTANYL CITRATE (PF) 100 MCG/2ML IJ SOLN
INTRAMUSCULAR | Status: AC
  Filled 2022-01-04: qty 2

## 2022-01-04 MED ORDER — FENTANYL CITRATE (PF) 100 MCG/2ML IJ SOLN
INTRAMUSCULAR | Status: DC | PRN
  Administered 2022-01-04 (×2): 50 ug via INTRAVENOUS
  Administered 2022-01-04: 100 ug via INTRAVENOUS

## 2022-01-04 MED ORDER — SODIUM CHLORIDE 0.9 % IR SOLN
Status: DC | PRN
  Administered 2022-01-04: 3000 mL

## 2022-01-04 MED ORDER — GABAPENTIN 300 MG PO CAPS: 300.0000 mg | ORAL_CAPSULE | Freq: Three times a day (TID) | ORAL | 0 refills | Status: DC

## 2022-01-04 MED ORDER — SUGAMMADEX SODIUM 200 MG/2ML IV SOLN
INTRAVENOUS | Status: DC | PRN
  Administered 2022-01-04: 200 mg via INTRAVENOUS

## 2022-01-04 MED ORDER — ACETAMINOPHEN 325 MG PO TABS: 650.0000 mg | ORAL_TABLET | Freq: Four times a day (QID) | ORAL | Status: DC | PRN

## 2022-01-04 MED ORDER — LIDOCAINE HCL (PF) 2 % IJ SOLN
INTRAMUSCULAR | Status: AC
  Filled 2022-01-04: qty 5

## 2022-01-04 MED ORDER — BUPIVACAINE HCL (PF) 0.25 % IJ SOLN
INTRAMUSCULAR | Status: DC | PRN
  Administered 2022-01-04: 15 mL

## 2022-01-04 MED ORDER — ORAL CARE MOUTH RINSE: 15.0000 mL | Freq: Once | OROMUCOSAL | Status: AC

## 2022-01-04 MED ORDER — ONDANSETRON HCL 4 MG/2ML IJ SOLN
INTRAMUSCULAR | Status: AC
  Filled 2022-01-04: qty 2

## 2022-01-04 MED ORDER — DEXAMETHASONE SODIUM PHOSPHATE 10 MG/ML IJ SOLN
INTRAMUSCULAR | Status: AC
  Filled 2022-01-04: qty 1

## 2022-01-04 MED ORDER — EPHEDRINE 5 MG/ML INJ
INTRAVENOUS | Status: AC
  Filled 2022-01-04: qty 5

## 2022-01-04 MED ORDER — CEFAZOLIN SODIUM-DEXTROSE 2-4 GM/100ML-% IV SOLN
INTRAVENOUS | Status: AC
  Filled 2022-01-04: qty 100

## 2022-01-04 MED ORDER — LIDOCAINE-EPINEPHRINE 0.5 %-1:200000 IJ SOLN
INTRAMUSCULAR | Status: AC
  Filled 2022-01-04: qty 1

## 2022-01-04 MED ORDER — MEPERIDINE HCL 50 MG/ML IJ SOLN: 6.2500 mg | INTRAMUSCULAR | Status: DC | PRN

## 2022-01-04 MED ORDER — MIDAZOLAM HCL 2 MG/2ML IJ SOLN
INTRAMUSCULAR | Status: AC
  Filled 2022-01-04: qty 2

## 2022-01-04 MED ORDER — EPHEDRINE SULFATE (PRESSORS) 50 MG/ML IJ SOLN
INTRAMUSCULAR | Status: DC | PRN
  Administered 2022-01-04 (×2): 5 mg via INTRAVENOUS

## 2022-01-04 MED ORDER — DEXAMETHASONE SODIUM PHOSPHATE 10 MG/ML IJ SOLN
INTRAMUSCULAR | Status: DC | PRN
  Administered 2022-01-04: 10 mg via INTRAVENOUS

## 2022-01-04 MED ORDER — SODIUM CHLORIDE 0.9 % IR SOLN
Status: DC | PRN
  Administered 2022-01-04: 1000 mL

## 2022-01-04 MED ORDER — PROPOFOL 10 MG/ML IV BOLUS
INTRAVENOUS | Status: DC | PRN
  Administered 2022-01-04: 200 mg via INTRAVENOUS

## 2022-01-04 MED ORDER — PHENYLEPHRINE HCL-NACL 20-0.9 MG/250ML-% IV SOLN
INTRAVENOUS | Status: DC | PRN
  Administered 2022-01-04: 30 ug/min via INTRAVENOUS

## 2022-01-04 MED ORDER — BUPIVACAINE HCL (PF) 0.25 % IJ SOLN
INTRAMUSCULAR | Status: AC
  Filled 2022-01-04: qty 30

## 2022-01-04 MED ORDER — FENTANYL CITRATE PF 50 MCG/ML IJ SOSY
25.0000 ug | PREFILLED_SYRINGE | INTRAMUSCULAR | Status: DC | PRN
  Administered 2022-01-04: 50 ug via INTRAVENOUS
  Filled 2022-01-04: qty 1

## 2022-01-04 MED ORDER — ONDANSETRON HCL 4 MG/2ML IJ SOLN
INTRAMUSCULAR | Status: DC | PRN
  Administered 2022-01-04: 4 mg via INTRAVENOUS

## 2022-01-04 MED ORDER — KETAMINE HCL 50 MG/5ML IJ SOSY
PREFILLED_SYRINGE | INTRAMUSCULAR | Status: AC
  Filled 2022-01-04: qty 5

## 2022-01-04 MED ORDER — ONDANSETRON HCL 4 MG/2ML IJ SOLN: 4.0000 mg | Freq: Once | INTRAMUSCULAR | Status: DC | PRN

## 2022-01-04 MED ORDER — KETOROLAC TROMETHAMINE 30 MG/ML IJ SOLN
INTRAMUSCULAR | Status: AC
  Administered 2022-01-04: 30 mg
  Filled 2022-01-04: qty 1

## 2022-01-04 MED ORDER — POVIDONE-IODINE 10 % EX SWAB: 2.0000 | Freq: Once | CUTANEOUS | Status: DC

## 2022-01-04 MED ORDER — ROCURONIUM BROMIDE 100 MG/10ML IV SOLN
INTRAVENOUS | Status: DC | PRN
  Administered 2022-01-04: 20 mg via INTRAVENOUS
  Administered 2022-01-04: 80 mg via INTRAVENOUS

## 2022-01-04 MED ORDER — PHENYLEPHRINE HCL (PRESSORS) 10 MG/ML IV SOLN
INTRAVENOUS | Status: DC | PRN
  Administered 2022-01-04: 80 ug via INTRAVENOUS
  Administered 2022-01-04 (×2): 160 ug via INTRAVENOUS
  Administered 2022-01-04: 100 ug via INTRAVENOUS

## 2022-01-04 MED ORDER — OXYCODONE-ACETAMINOPHEN 5-325 MG PO TABS: 1.0000 | ORAL_TABLET | Freq: Four times a day (QID) | ORAL | 0 refills | Status: AC | PRN

## 2022-01-04 MED ORDER — LIDOCAINE-EPINEPHRINE 0.5 %-1:200000 IJ SOLN
INTRAMUSCULAR | Status: DC | PRN
  Administered 2022-01-04: 10 mL

## 2022-01-04 MED ORDER — CEFAZOLIN SODIUM-DEXTROSE 2-4 GM/100ML-% IV SOLN
2.0000 g | INTRAVENOUS | Status: AC
  Administered 2022-01-04: 2 g via INTRAVENOUS

## 2022-01-04 SURGICAL SUPPLY — 48 items
ADH SKN CLS APL DERMABOND .7 (GAUZE/BANDAGES/DRESSINGS) ×4
ADH SKN CLS LQ APL DERMABOND (GAUZE/BANDAGES/DRESSINGS) ×4
APL ESCP 73.6OZ SRGCL (TIP)
BLADE SURG SZ11 CARB STEEL (BLADE) ×4 IMPLANT
CATH ROBINSON RED A/P 16FR (CATHETERS) ×4 IMPLANT
CLOTH BEACON ORANGE TIMEOUT ST (SAFETY) ×4 IMPLANT
COVER LIGHT HANDLE STERIS (MISCELLANEOUS) ×8 IMPLANT
DERMABOND ADVANCED .7 DNX12 (GAUZE/BANDAGES/DRESSINGS) ×4 IMPLANT
DERMABOND ADVANCED .7 DNX6 (GAUZE/BANDAGES/DRESSINGS) ×1 IMPLANT
DILATOR CANAL MILEX (MISCELLANEOUS) IMPLANT
DURAPREP 26ML APPLICATOR (WOUND CARE) ×4 IMPLANT
GAUZE 4X4 16PLY ~~LOC~~+RFID DBL (SPONGE) ×8 IMPLANT
GLOVE BIO SURGEON STRL SZ 6.5 (GLOVE) ×4 IMPLANT
GLOVE BIOGEL PI IND STRL 7.0 (GLOVE) ×12 IMPLANT
GLOVE SURG LTX SZ6.5 (GLOVE) ×4 IMPLANT
GOWN STRL REUS W/ TWL LRG LVL3 (GOWN DISPOSABLE) ×8 IMPLANT
GOWN STRL REUS W/TWL LRG LVL3 (GOWN DISPOSABLE) ×12 IMPLANT
IV NS IRRIG 3000ML ARTHROMATIC (IV SOLUTION) ×4 IMPLANT
KIT TURNOVER CYSTO (KITS) ×4 IMPLANT
KIT TURNOVER KIT A (KITS) ×4 IMPLANT
NDL HYPO 25X1 1.5 SAFETY (NEEDLE) ×3 IMPLANT
NDL INSUFFLATION 14GA 120MM (NEEDLE) ×3 IMPLANT
NEEDLE HYPO 25X1 1.5 SAFETY (NEEDLE) ×4 IMPLANT
NEEDLE INSUFFLATION 14GA 120MM (NEEDLE) ×4 IMPLANT
NS IRRIG 1000ML POUR BTL (IV SOLUTION) ×4 IMPLANT
PACK PERI GYN (CUSTOM PROCEDURE TRAY) ×4 IMPLANT
PAD ARMBOARD 7.5X6 YLW CONV (MISCELLANEOUS) ×4 IMPLANT
PAD TELFA 3X4 1S STER (GAUZE/BANDAGES/DRESSINGS) ×4 IMPLANT
POWDER SURGICEL 3.0 GRAM (HEMOSTASIS) IMPLANT
SET BASIN LINEN APH (SET/KITS/TRAYS/PACK) ×4 IMPLANT
SET GENESYS HTA PROCERVA (MISCELLANEOUS) ×4 IMPLANT
SET TUBE SMOKE EVAC HIGH FLOW (TUBING) ×4 IMPLANT
SHEARS HARMONIC ACE PLUS 36CM (ENDOMECHANICALS) ×4 IMPLANT
SLEEVE Z-THREAD 5X100MM (TROCAR) ×4 IMPLANT
SOL ANTI FOG 6CC (MISCELLANEOUS) ×4 IMPLANT
SOLUTION ANTI FOG 6CC (MISCELLANEOUS) ×4
SUT MON AB 4-0 PS1 27 (SUTURE) ×4 IMPLANT
SUT VICRYL 0 UR6 27IN ABS (SUTURE) IMPLANT
SYR 10ML LL (SYRINGE) ×4 IMPLANT
SYR CONTROL 10ML LL (SYRINGE) ×4 IMPLANT
TIP ENDOSCOPIC SURGICEL (TIP) IMPLANT
TRAY FOLEY W/BAG SLVR 16FR (SET/KITS/TRAYS/PACK) ×4
TRAY FOLEY W/BAG SLVR 16FR ST (SET/KITS/TRAYS/PACK) ×4 IMPLANT
TROCAR KII 8X100ML NONTHREADED (TROCAR) ×4 IMPLANT
TROCAR Z-THREAD FIOS 5X100MM (TROCAR) ×4 IMPLANT
TUBING IRRIGATION (MISCELLANEOUS) IMPLANT
UNDERPAD 30X36 HEAVY ABSORB (UNDERPADS AND DIAPERS) ×4 IMPLANT
WARMER LAPAROSCOPE (MISCELLANEOUS) ×4 IMPLANT

## 2022-01-04 NOTE — Anesthesia Postprocedure Evaluation (Signed)
Anesthesia Post Note  Patient: [name]  Procedure(s) Performed: LAPAROSCOPIC BILATERAL SALPINGECTOMY (Bilateral: Abdomen) DILATATION & CURETTAGE/HYSTEROSCOPY WITH HYDROTHERMAL ABLATION (Cervix) REMOVAL OF NEXPLANON IMPLANT (Left: Arm Upper)  Patient location during evaluation: Phase II Anesthesia Type: General Level of consciousness: awake and alert and oriented Pain management: pain level controlled Vital Signs Assessment: post-procedure vital signs reviewed and stable Respiratory status: spontaneous breathing, nonlabored ventilation and respiratory function stable Cardiovascular status: blood pressure returned to baseline and stable Postop Assessment: no apparent nausea or vomiting Anesthetic complications: no   No notable events documented.   Last Vitals:  Vitals:   01/04/22 1230 01/04/22 1308  BP:  (!) 130/90  Pulse: 87 95  Resp: 16 17  Temp:  36.6 C  SpO2: 91% 95%    Last Pain:  Vitals:   01/04/22 1308  TempSrc: Oral  PainSc: 5                  Erikah Thumm C Ajai Harville

## 2022-01-04 NOTE — Anesthesia Procedure Notes (Signed)
Procedure Name: Intubation Date/Time: 01/04/2022 10:35 AM  Performed by: Denese Killings, MDPre-anesthesia Checklist: Patient identified, Emergency Drugs available, Suction available and Patient being monitored Patient Re-evaluated:Patient Re-evaluated prior to induction Oxygen Delivery Method: Circle system utilized Preoxygenation: Pre-oxygenation with 100% oxygen Induction Type: IV induction Ventilation: Mask ventilation without difficulty Laryngoscope Size: Mac and 3 Grade View: Grade II Tube type: Oral Tube size: 7.0 mm Number of attempts: 2 (DL x1 Miller 2, SRNA) Airway Equipment and Method: Stylet Placement Confirmation: ETT inserted through vocal cords under direct vision, positive ETCO2 and breath sounds checked- equal and bilateral Secured at: 22 cm Tube secured with: Tape Dental Injury: Teeth and Oropharynx as per pre-operative assessment

## 2022-01-04 NOTE — Discharge Instructions (Signed)
HOME INSTRUCTIONS  Please note any unusual or excessive bleeding, pain, swelling. Mild dizziness or drowsiness are normal for about 24 hours after surgery.   Shower when comfortable  Restrictions: No driving for 24 hours or while taking pain medications.  Activity:  No heavy lifting (> 10 lbs), nothing in vagina (no tampons, douching, or intercourse) x 4 weeks; no tub baths for 4 weeks Vaginal spotting is expected but if your bleeding is heavy, period like,  please call the office   Incision: the bandaids will fall off when they are ready to; you may clean your incision with mild soap and water but do not rub or scrub the incision site.  You may experience slight bloody drainage from your incision periodically.  This is normal.  If you experience a large amount of drainage or the incision opens, please call your physician who will likely direct you to the emergency department.  Diet:  You may return to your regular diet.  Do not eat large meals.  Eat small frequent meals throughout the day.  Continue to drink a good amount of water at least 6-8 glasses of water per day, hydration is very important for the healing process.  Pain Management: Take Ibuprofen every 6 hours with food as needed for pain.  For moderate to severe pain, a prescription of percocet has been sent in.  You can alternate this medication with the ibuprofen.   -If the Percocet is too strong, switch to over the counter tylenol instead.   -Do not take Percocet (oxycodone/acetaminophen) and Tylenol (acetaminophen) together (percocet has tylenol in it).  You may also take gabapentin 3 times daily for the next 4 days.  Try to space out this medication evenly.  Always take prescription pain medication with food.  Percocet may cause constipation, you may want to take a stool softener while taking this medication.  A prescription of colace has been sent in to take twice daily if needed while taking the oxycodone.  Be sure to drink  plenty of fluids and increase your fiber to help with constipation.  Alcohol -- Avoid for 24 hours and while taking pain medications.  Nausea: Take sips of ginger ale or soda  Fever -- Call physician if temperature over 101 degrees  Follow up:  If you do not already have a follow up appointment scheduled, please call the office at [phone].  If you experience fever (a temperature greater than 100.4), pain unrelieved by pain medication, shortness of breath, swelling of a single leg, or any other symptoms which are concerning to you please the office immediately.

## 2022-01-04 NOTE — Anesthesia Preprocedure Evaluation (Addendum)
Anesthesia Evaluation  Patient identified by MRN, date of birth, ID band Patient awake    Reviewed: Allergy & Precautions, NPO status , Patient's Chart, lab work & pertinent test results  History of Anesthesia Complications (+) PROLONGED EMERGENCE, Family history of anesthesia reaction and history of anesthetic complications  Airway Mallampati: II  TM Distance: >3 FB Neck ROM: Full    Dental  (+) Dental Advisory Given, Missing   Pulmonary    breath sounds clear to auscultation       Cardiovascular Exercise Tolerance: Good hypertension, Pt. on medications Normal cardiovascular exam+ dysrhythmias (tachycardia)  Rhythm:Regular Rate:Normal  Erma Heritage, PA-C  01/03/2022 4:12 PM EDT   FYI, the patient's stress test was low-risk and she is cleared to proceed with surgery from a cardiac perspective.  Best,  [place]      Neuro/Psych  Headaches (IIH), PSYCHIATRIC DISORDERS Depression    GI/Hepatic negative GI ROS, Neg liver ROS,   Endo/Other  Hyperthyroidism   Renal/GU negative Renal ROS     Musculoskeletal negative musculoskeletal ROS (+)   Abdominal   Peds  Hematology negative hematology ROS (+)   Anesthesia Other Findings   Reproductive/Obstetrics negative OB ROS                         Anesthesia Physical Anesthesia Plan  ASA: 3  Anesthesia Plan: General   Post-op Pain Management:    Induction: Intravenous  PONV Risk Score and Plan: 4 or greater and Ondansetron, Dexamethasone and Midazolam  Airway Management Planned: Oral ETT  Additional Equipment:   Intra-op Plan:   Post-operative Plan: Extubation in OR  Informed Consent: I have reviewed the patients History and Physical, chart, labs and discussed the procedure including the risks, benefits and alternatives for the proposed anesthesia with the patient or authorized representative who has indicated his/her  understanding and acceptance.     Dental advisory given  Plan Discussed with: CRNA and Surgeon  Anesthesia Plan Comments:         Anesthesia Quick Evaluation

## 2022-01-04 NOTE — Transfer of Care (Signed)
Immediate Anesthesia Transfer of Care Note  Patient: [name]  Procedure(s) Performed: LAPAROSCOPIC BILATERAL SALPINGECTOMY (Bilateral: Abdomen) DILATATION & CURETTAGE/HYSTEROSCOPY WITH HYDROTHERMAL ABLATION (Cervix) REMOVAL OF NEXPLANON IMPLANT (Left: Arm Upper)  Patient Location: PACU  Anesthesia Type:General  Level of Consciousness: awake, alert  and oriented  Airway & Oxygen Therapy: Patient Spontanous Breathing  Post-op Assessment: Report given to RN and Post -op Vital signs reviewed and stable  Post vital signs: Reviewed and stable  Last Vitals:  Vitals Value Taken Time  BP 141/96 01/04/22 1209  Temp 97.8   Pulse 108 01/04/22 1210  Resp 19 01/04/22 1210  SpO2 98 % 01/04/22 1210  Vitals shown include unvalidated device data.  Last Pain:  Vitals:   01/04/22 0839  PainSc: 0-No pain         Complications: No notable events documented.

## 2022-01-04 NOTE — Progress Notes (Signed)
Nexplanon removal site to left upper arm bandaid on site with no drainage

## 2022-01-04 NOTE — Op Note (Signed)
PREOPERATIVE DIAGNOSIS:  Desires sterilization, Heavy menstrual bleeding, Nexplanon in place POSTOPERATIVE DIAGNOSIS: same PROCEDURE PERFORMED: Laparoscopic bilateral salpingectomy, Hysteroscopy, D&C, Hydrothermal ablation, Nexplanon removal SURGEON: Dr. Myna Hidalgo ANESTHESIA: General endotracheal.  ESTIMATED BLOOD LOSS: 20cc.  URINE OUTPUT: 25cc of clear yellow urine at the end of the procedure.  IV FLUIDS: 1000 cc of crystalloid.  SPECIMEN(S): bilateral fallopian tubes, endometrial curettings COMPLICATIONS: None.  CONDITION: Stable.   FINDINGS: No ascites or peritoneal studding was appreciated.  Grossly normal appearing bowel. Uterus normal size and shape.  Ovaries and tubes were normal in appearance.  On hysteroscope- both ostial visualized, proliferative endometrium noted.  Procedure: The patient was taken to the operating room where she underwent general anesthesia without difficulty. The patient was placed in a low lithotomy position using Allen stirrups. She was then prepped and draped in the normal sterile fashion. Foley catheter was placed.  Sterile speculum was placed.  A single tooth tenaculum was placed on the anterior lip of the cervix. Cervical block was completed using 0.5% lidocaine with epinephrine.   The uterus was then sounded to 8cm. TThe hysteroscope was inserted with the findings as noted above.  The hysteroscope was removed and sharp curettage was performed. The tissue was sent to pathology.   The hydrothermal ablation hysteroscopic apparatus was then reinserted.  Cavity assessment was performed and passed with minimal leakage of fluid.  The hydrothermal ablation was then carried out as per protocol.   Complete ablation of the endometrium was observed and the hysteroscope was removed under direct visualization.  The Hulka manipulator was placed.  The speculum was removed.  Gown and gloves were changed and attention was turned to the patient's abdomen.   10cc of 0.25%  Lidocained was inserted.  A 5 mm infraumbilical skin incision was made with the scalpel. The veress needle was carefully introduced into the peritoneal cavity while tenting the abdominal wall. Intraperitoneal placement was confirmed by use of a saline-drop test.  The gas was connected and confirmed intrabdominal placement by a low initial pressure of 8 mmHg. The abdomen was then insuflated with CO2 gas. The trocar and sleeve were then advanced without difficulty into the abdomen under direct visualization. Intraabdominal placement was confirmed by the laparoscope and surveillance of the abdomen was performed. Grossly normal appearing abdomen as mentioned in findings above.  Two additional ports were placed.  Each area was injected with 10cc of local. A 61mm trocar on her left lower quadrant and an 26mm at her right lower quadrent.  Each trocar was placed under direct visualization.      The right fallopian tube was placed on tension and using the Harmonic, the tube was excised and removed through the port under direct visualization.  A similar procedure was performed on the left with removal of the fallopian tube.  A small area of spotting was noted near the cornua, hemostasis was obtained with the harmonic.  Surgicel was placed.  Excellent hemostasis was noted.  The instruments were then removed from the patients abdomen with air allowed to fully escape. The right port site was closed with monocryl and all sites were closed with dermabond.  The manipulator was removed from the cervix with no lacerations or bleeding identified.     Attention was turned to the left arm.  Nexplanon site identified.  Area prepped in usual sterile fashon. A small stab incision was made right beside the implant on the distal portion.  The Nexplanon rod was grasped using hemostats and removed intact without  difficulty.  Steri-strips and a pressure bandage was applied.  There was less than 3 cc blood loss. There were no complications.   The patient tolerated the procedure well. The patient tolerated the procedure well with all sponge, lap, and needle counts correct. The patient was taken to recovery in stable condition. Foley was removed at the end of the procedure.  Janyth Pupa, DO Attending [place], [facility] for [employer], [place]

## 2022-01-05 ENCOUNTER — Encounter: Payer: Self-pay | Admitting: Obstetrics & Gynecology

## 2022-01-05 LAB — SURGICAL PATHOLOGY

## 2022-01-10 ENCOUNTER — Encounter (HOSPITAL_COMMUNITY): Payer: Self-pay | Admitting: Obstetrics & Gynecology

## 2022-01-11 ENCOUNTER — Encounter: Payer: Self-pay | Admitting: Obstetrics & Gynecology

## 2022-01-11 ENCOUNTER — Ambulatory Visit (INDEPENDENT_AMBULATORY_CARE_PROVIDER_SITE_OTHER): Payer: PRIVATE HEALTH INSURANCE | Admitting: Obstetrics & Gynecology

## 2022-01-11 VITALS — BP 124/81 | HR 85 | Ht 63.5 in | Wt 224.0 lb

## 2022-01-11 DIAGNOSIS — R3989 Other symptoms and signs involving the genitourinary system: Secondary | ICD-10-CM

## 2022-01-11 NOTE — Progress Notes (Signed)
    PostOp Visit Note  [name] is a 36 y.o. [id number] female who presents for a postoperative visit. She is 1 week postop following a laparoscopic bilateral tubal ligation, HTA ablation and Nexplanon removal completed on 10/3.   Today she notes that she is doing well.  Notes some soreness, but overall feels good.  Did have a period immediately following surgery that has now stopped.  Denies HMB.   Denies fever or chills.  Tolerating gen diet.  +Flatus, Regular BMs.  Overall doing well and reports no acute complaints   Review of Systems Pertinent items are noted in HPI.    Objective:  BP 124/81 (BP Location: Left Arm, Patient Position: Sitting, Cuff Size: Normal)   Pulse 85   Ht 5' 3.5" (1.613 m)   Wt 224 lb (101.6 kg)   LMP 12/24/2021   BMI 39.06 kg/m    Physical Examination:  GENERAL ASSESSMENT: well developed and well nourished SKIN: warm and dry CHEST: normal air exchange, respiratory effort normal with no retractions HEART: regular rate and rhythm ABDOMEN: soft, non-distended.   Incisions C/D/I with mild ecchymosis EXTREMITY: no calf tenderness, no edema bilaterally PSYCH: mood appropriate, normal affect       Assessment:    Postop visit   Plan:   Meeting milestones appropriately May return to work, f/u prn or 70yr   Janyth Pupa, DO Attending [place], Product/process development scientist for [employer], [place]

## 2022-01-29 ENCOUNTER — Ambulatory Visit
Admission: EM | Admit: 2022-01-29 | Discharge: 2022-01-29 | Disposition: A | Discharge status: Home / Self Care | Payer: PRIVATE HEALTH INSURANCE | Attending: Nurse Practitioner | Admitting: Nurse Practitioner

## 2022-01-29 ENCOUNTER — Ambulatory Visit: Payer: Self-pay

## 2022-01-29 DIAGNOSIS — R350 Frequency of micturition: Secondary | ICD-10-CM | POA: Insufficient documentation

## 2022-01-29 LAB — POCT URINALYSIS DIP (MANUAL ENTRY)
Bilirubin, UA: NEGATIVE
Glucose, UA: NEGATIVE mg/dL
Ketones, POC UA: NEGATIVE mg/dL
Nitrite, UA: NEGATIVE
Protein Ur, POC: NEGATIVE mg/dL
Spec Grav, UA: 1.02 (ref 1.010–1.025)
Urobilinogen, UA: 0.2 E.U./dL
pH, UA: 7 (ref 5.0–8.0)

## 2022-01-29 NOTE — ED Provider Notes (Signed)
[facility]    CSN: [id number] Arrival date & time: 01/29/22  1138      History   Chief Complaint No chief complaint on file.   HPI [name] is a 36 y.o. female.   The history is provided by the patient.   Patient presents for complaints of urinary frequency and low back and hip pain that started after she had an ablation around the first week of October.  She states this week, she has since developed pain in her lower abdomen.  She denies fever, chills, chest pain, nausea, vomiting, diarrhea, pain with urination, decreased urine flow, or vaginal symptoms.  Patient states that she has noticed blood when she is wiping, but states that she was told by the gynecologist that she would have spotting after the procedure.  She states that she does currently take Diamox and saw that this could cause kidney stones.  She states that she did follow-up with the gynecologist and told them of her symptoms, but that she did not work her up for her complaint.  Past Medical History:  Diagnosis Date   Abnormal uterine bleeding (AUB) 01/19/2015   Chest pain    Complication of anesthesia    difficult to wake up after anesthesia   Depression    Dysrhythmia    Family history of adverse reaction to anesthesia    Fibrocystic breast changes    Hypertension    Hyperthyroidism    IIH (idiopathic intracranial hypertension)    Migraines    Nexplanon in place 01/19/2015   Nexplanon insertion 11/29/2012   inserted nexplanon 11/29/12 in left arm, remove 11/30/15   Obesity    Preterm delivery    Seasonal rhinitis    Tachycardia    UTI (lower urinary tract infection)     Patient Active Problem List   Diagnosis Date Noted   IIH (idiopathic intracranial hypertension) 11/08/2021   Irregular bleeding 02/12/2021   Encounter for sterilization 02/12/2021   Breast pain, left 05/08/2020   Discharge from left nipple 05/08/2020   Enlarged thyroid gland 01/04/2019   Feeling tired 11/14/2016    Tachycardia 07/01/2015   Tibial plateau fracture    Abnormal uterine bleeding (AUB) 01/19/2015   Nexplanon removal 01/19/2015   Breakthrough bleeding on Nexplanon 06/13/2013   Nexplanon insertion 11/29/2012   OBESITY 10/31/2007   DEPRESSION 10/31/2007   ALLERGIC RHINITIS, SEASONAL 10/31/2007   FIBROCYSTIC BREAST DISEASE 10/31/2007    Past Surgical History:  Procedure Laterality Date   CHOLECYSTECTOMY     DILITATION & CURRETTAGE/HYSTROSCOPY WITH HYDROTHERMAL ABLATION N/A 01/04/2022   Procedure: DILATATION & CURETTAGE/HYSTEROSCOPY WITH HYDROTHERMAL ABLATION;  Surgeon: Janyth Pupa, DO;  Location: [facility];  Service: Gynecology;  Laterality: N/A;   LAPAROSCOPIC BILATERAL SALPINGECTOMY Bilateral 01/04/2022   Procedure: LAPAROSCOPIC BILATERAL SALPINGECTOMY;  Surgeon: Janyth Pupa, DO;  Location: [facility];  Service: Gynecology;  Laterality: Bilateral;   ORIF TIBIA PLATEAU Left 07/01/2015   Procedure: OPEN REDUCTION INTERNAL FIXATION (ORIF) TIBIAL PLATEAU AND BONE GRAFT LEFT TIBIAL PLATEAU;  Surgeon: Carole Civil, MD;  Location: [facility];  Service: Orthopedics;  Laterality: Left;   REMOVAL OF NON VAGINAL CONTRACEPTIVE DEVICE Left 01/04/2022   Procedure: REMOVAL OF NEXPLANON IMPLANT;  Surgeon: Janyth Pupa, DO;  Location: [facility];  Service: Gynecology;  Laterality: Left;   TONSILLECTOMY      OB History     Gravida  3   Para  2   Term      Preterm  2   AB  1   Living  2      SAB  1   IAB      Ectopic      Multiple      Live Births  2            Home Medications    Prior to Admission medications   Medication Sig Start Date End Date Taking? Authorizing Provider  acetaZOLAMIDE ER (DIAMOX) 500 MG capsule Take 500 mg by mouth 2 (two) times daily.    [provider]  Carboxymethylcell-Glycerin PF (REFRESH RELIEVA PF) 0.5-1 % SOLN Place 1 drop into both eyes 4 (four) times daily.    [provider]  gabapentin (NEURONTIN) 300 MG capsule Take 1  capsule (300 mg total) by mouth 3 (three) times daily for 4 days. 01/04/22 01/08/22  Janyth Pupa, DO  ibuprofen (ADVIL) 600 MG tablet Take 1 tablet (600 mg total) by mouth every 6 (six) hours as needed. 01/04/22   Janyth Pupa, DO  Multiple Vitamin (MULTIVITAMIN WITH MINERALS) TABS tablet Take 1 tablet by mouth daily.    [provider]    Family History Family History  Problem Relation Age of Onset   Hypertension Mother    Hypertension Father    Hypertension Maternal Grandmother    Hypertension Maternal Grandfather    Diabetes Paternal Grandmother    Hypertension Paternal Grandmother    Heart disease Paternal Grandfather    Diabetes Paternal Grandfather    Hypertension Paternal Grandfather     Social History Social History   Tobacco Use   Smoking status: Never   Smokeless tobacco: Never  Vaping Use   Vaping Use: Never used  Substance Use Topics   Alcohol use: No   Drug use: No     Allergies   Hydrocodone, Shellfish allergy, Codeine, and Dilaudid [hydromorphone hcl]   Review of Systems Review of Systems Per HPI  Physical Exam Triage Vital Signs ED Triage Vitals  Enc Vitals Group     BP 01/29/22 1154 119/80     Pulse Rate 01/29/22 1154 80     Resp 01/29/22 1154 20     Temp 01/29/22 1154 98.1 F (36.7 C)     Temp Source 01/29/22 1154 Oral     SpO2 01/29/22 1154 98 %     Weight --      Height --      Head Circumference --      Peak Flow --      Pain Score 01/29/22 1159 4     Pain Loc --      Pain Edu? --      Excl. in [place]? --    No data found.  Updated Vital Signs BP 119/80 (BP Location: Right Arm)   Pulse 80   Temp 98.1 F (36.7 C) (Oral)   Resp 20   LMP 12/24/2021   SpO2 98%   Visual Acuity Right Eye Distance:   Left Eye Distance:   Bilateral Distance:    Right Eye Near:   Left Eye Near:    Bilateral Near:     Physical Exam Vitals and nursing note reviewed.  Constitutional:      General: She is not in acute distress.     Appearance: Normal appearance.  HENT:     Head: Normocephalic.  Eyes:     Extraocular Movements: Extraocular movements intact.     Conjunctiva/sclera: Conjunctivae normal.     Pupils: Pupils are equal, round, and reactive to light.  Cardiovascular:  Rate and Rhythm: Normal rate and regular rhythm.     Pulses: Normal pulses.     Heart sounds: Normal heart sounds.  Pulmonary:     Effort: Pulmonary effort is normal. No respiratory distress.     Breath sounds: Normal breath sounds. No stridor. No wheezing, rhonchi or rales.  Abdominal:     General: Bowel sounds are normal. There is no distension.     Palpations: Abdomen is soft.     Tenderness: There is abdominal tenderness in the suprapubic area. There is no right CVA tenderness or left CVA tenderness.  Musculoskeletal:     Cervical back: Normal range of motion.  Lymphadenopathy:     Cervical: No cervical adenopathy.  Skin:    General: Skin is warm and dry.  Neurological:     General: No focal deficit present.     Mental Status: She is alert and oriented to person, place, and time.  Psychiatric:        Mood and Affect: Mood normal.        Behavior: Behavior normal.      UC Treatments / Results  Labs (all labs ordered are listed, but only abnormal results are displayed) Labs Reviewed  POCT URINALYSIS DIP (MANUAL ENTRY) - Abnormal; Notable for the following components:      Result Value   Clarity, UA hazy (*)    Blood, UA moderate (*)    Leukocytes, UA Trace (*)    All other components within normal limits  URINE CULTURE    EKG   Radiology No results found.  Procedures Procedures (including critical care time)  Medications Ordered in UC Medications - No data to display  Initial Impression / Assessment and Plan / UC Course  I have reviewed the triage vital signs and the nursing notes.  Pertinent labs & imaging results that were available during my care of the patient were reviewed by me and considered in my  medical decision making (see chart for details).  Patient presents for complaints of urinary frequency and suprapubic pressure.  Symptoms have been present for 2 to 3 weeks.  Urinalysis shows trace leukocytes and moderate blood, blood most likely from the procedure that she had at the beginning of October.  It is unclear whether patient's symptoms are related to a suspected UTI or if this is related to the procedure that she had earlier this month.  Urine culture is pending at this time.  Discussed with patient that recommend waiting on the urine culture to see if this determines the cause of her symptoms.  Patient was given supportive care recommendations in the interim.  Patient was advised that if the culture results are negative and she continues to experience symptoms, recommend that she follow-up with the gynecologist for further evaluation.  Patient is in agreement with this plan of care.  Patient verbalizes understanding.  All questions were answered.  Patient is stable for discharge. Final Clinical Impressions(s) / UC Diagnoses   Final diagnoses:  Urinary frequency     Discharge Instructions      -Urinalysis does not clearly indicate a UTI. Urine culture is pending. You will be contacted if the results of the culture are positive.  -Increase fluids. Try to drink 8 to 10 eight ounce glasses of water while symptoms persist.  -Ibuprofen or Tylenol for pain, fever, or general discomfort. -Develop a toileting schedule that will allow you to toilet at least every 2 hours. -Avoid caffeine to include tea, soda, and coffee. -If  sexually active, void at least 15 to 20 minutes after sexual intercourse. -Recommend follow-up with your gynecologist if you continue to experience symptoms and the urine culture is negative.     ED Prescriptions   None    PDMP not reviewed this encounter.   Tish Men, NP 01/29/22 1307

## 2022-01-29 NOTE — ED Triage Notes (Signed)
Pt  reports frequent urination after surgery a week after), light blood when wiping , lower back/hip pain, lower abdominal pain which started this week   Pt had ablation and tubal surgery 01/04/22

## 2022-01-29 NOTE — Discharge Instructions (Signed)
-  Urinalysis does not clearly indicate a UTI. Urine culture is pending. You will be contacted if the results of the culture are positive.  -Increase fluids. Try to drink 8 to 10 eight ounce glasses of water while symptoms persist.  -Ibuprofen or Tylenol for pain, fever, or general discomfort. -Develop a toileting schedule that will allow you to toilet at least every 2 hours. -Avoid caffeine to include tea, soda, and coffee. -If sexually active, void at least 15 to 20 minutes after sexual intercourse. -Recommend follow-up with your gynecologist if you continue to experience symptoms and the urine culture is negative.

## 2022-01-30 LAB — URINE CULTURE: Culture: NO GROWTH

## 2022-01-31 ENCOUNTER — Other Ambulatory Visit: Payer: PRIVATE HEALTH INSURANCE

## 2022-03-03 ENCOUNTER — Encounter (HOSPITAL_BASED_OUTPATIENT_CLINIC_OR_DEPARTMENT_OTHER): Payer: Self-pay

## 2022-03-03 DIAGNOSIS — R0683 Snoring: Secondary | ICD-10-CM

## 2022-03-03 DIAGNOSIS — R5383 Other fatigue: Secondary | ICD-10-CM

## 2022-03-03 DIAGNOSIS — R0681 Apnea, not elsewhere classified: Secondary | ICD-10-CM

## 2022-03-04 ENCOUNTER — Ambulatory Visit: Payer: PRIVATE HEALTH INSURANCE | Attending: Physician Assistant | Admitting: Pulmonary Disease

## 2022-03-04 DIAGNOSIS — R0683 Snoring: Secondary | ICD-10-CM | POA: Diagnosis not present

## 2022-03-04 DIAGNOSIS — R5383 Other fatigue: Secondary | ICD-10-CM | POA: Diagnosis not present

## 2022-03-04 DIAGNOSIS — R0681 Apnea, not elsewhere classified: Secondary | ICD-10-CM | POA: Diagnosis not present

## 2022-03-29 NOTE — Procedures (Signed)
     Patient Name: [name], [place] Date: 03/04/2022 Gender: Female D.O.B: 02-13-1986 Age (years): 36 Referring Provider: Wayland Denis PA-C Height (inches): 63 Interpreting Physician: Coralyn Helling MD, ABSM Weight (lbs): 224 RPSGT: Alfonso Ellis BMI: 40 MRN: [id number] Neck Size: <br>  CLINICAL INFORMATION Sleep Study Type: HST  Indication for sleep study: snoring  SLEEP STUDY TECHNIQUE A multi-channel overnight portable sleep study was performed. The channels recorded were: nasal airflow, thoracic respiratory movement, and oxygen saturation with a pulse oximetry. Snoring was also monitored.  MEDICATIONS Patient self administered medications include: N/A.  SLEEP ARCHITECTURE Patient was studied for 578.9 minutes. The sleep efficiency was 96.5 % and the patient was supine for 0%. The arousal index was 0.0 per hour.  RESPIRATORY PARAMETERS The overall AHI was 0.4 per hour, with a central apnea index of 0 per hour.  The oxygen nadir was 90% during sleep.  CARDIAC DATA Mean heart rate during sleep was 73.2 bpm.  IMPRESSIONS - No significant obstructive sleep apnea occurred during this study (AHI = 0.4/h). - The patient had minimal or no oxygen desaturation during the study (Min O2 = 90%) - Patient snored 6.2% during the sleep.  DIAGNOSIS - Normal study  RECOMMENDATIONS - Avoid alcohol, sedatives and other CNS depressants that may worsen sleep apnea and disrupt normal sleep architecture. - Sleep hygiene should be reviewed to assess factors that may improve sleep quality. - Weight management and regular exercise should be initiated or continued.  [Electronically signed] 03/29/2022 05:57 AM  Coralyn Helling MD, ABSM Diplomate, American Board of Sleep Medicine NPI: [id number]  [facility] PH: [phone]   FX: [phone] ACCREDITED BY THE AMERICAN ACADEMY OF SLEEP MEDICINE

## 2022-04-23 DIAGNOSIS — R0681 Apnea, not elsewhere classified: Secondary | ICD-10-CM

## 2022-06-02 ENCOUNTER — Encounter: Payer: Self-pay | Admitting: Radiology

## 2022-06-23 ENCOUNTER — Other Ambulatory Visit (HOSPITAL_COMMUNITY): Payer: Self-pay | Admitting: General Surgery

## 2022-06-30 ENCOUNTER — Ambulatory Visit: Payer: PRIVATE HEALTH INSURANCE | Admitting: Obstetrics & Gynecology

## 2022-07-04 ENCOUNTER — Encounter (HOSPITAL_COMMUNITY): Admission: RE | Admit: 2022-07-04 | Payer: PRIVATE HEALTH INSURANCE | Source: Ambulatory Visit

## 2022-07-11 ENCOUNTER — Inpatient Hospital Stay (HOSPITAL_COMMUNITY): Admission: RE | Admit: 2022-07-11 | Payer: PRIVATE HEALTH INSURANCE | Source: Ambulatory Visit

## 2022-07-11 ENCOUNTER — Encounter (HOSPITAL_COMMUNITY): Payer: Self-pay

## 2022-07-11 ENCOUNTER — Ambulatory Visit (HOSPITAL_COMMUNITY): Payer: PRIVATE HEALTH INSURANCE | Attending: General Surgery

## 2022-07-18 ENCOUNTER — Encounter (HOSPITAL_COMMUNITY)
Admission: RE | Admit: 2022-07-18 | Discharge: 2022-07-18 | Disposition: A | Discharge status: Home / Self Care | Payer: PRIVATE HEALTH INSURANCE | Source: Ambulatory Visit | Attending: Physician Assistant | Admitting: Physician Assistant

## 2022-07-18 NOTE — Progress Notes (Signed)
Patient has not arrived for EKG appt. Left a voicemail to confirm she is coming today or if she needs to reschedule.

## 2022-08-11 ENCOUNTER — Ambulatory Visit: Payer: PRIVATE HEALTH INSURANCE | Admitting: Dietician

## 2022-08-30 ENCOUNTER — Ambulatory Visit: Payer: PRIVATE HEALTH INSURANCE | Admitting: Dietician

## 2022-11-18 ENCOUNTER — Other Ambulatory Visit (HOSPITAL_COMMUNITY): Payer: Self-pay | Admitting: General Surgery

## 2022-11-18 DIAGNOSIS — K2100 Gastro-esophageal reflux disease with esophagitis, without bleeding: Secondary | ICD-10-CM

## 2022-11-18 DIAGNOSIS — E569 Vitamin deficiency, unspecified: Secondary | ICD-10-CM

## 2022-12-02 ENCOUNTER — Ambulatory Visit: Payer: PRIVATE HEALTH INSURANCE | Admitting: Dietician

## 2022-12-02 ENCOUNTER — Other Ambulatory Visit (HOSPITAL_COMMUNITY): Payer: Self-pay

## 2022-12-07 ENCOUNTER — Other Ambulatory Visit (HOSPITAL_COMMUNITY): Payer: Self-pay

## 2022-12-07 MED ORDER — WEGOVY 0.25 MG/0.5ML ~~LOC~~ SOAJ
0.2500 mg | SUBCUTANEOUS | 0 refills | Status: DC
  Filled 2022-12-07 (×2): qty 2, 28d supply, fill #0

## 2022-12-08 ENCOUNTER — Other Ambulatory Visit (HOSPITAL_COMMUNITY): Payer: Self-pay

## 2022-12-09 ENCOUNTER — Other Ambulatory Visit: Payer: Self-pay

## 2022-12-09 ENCOUNTER — Ambulatory Visit (HOSPITAL_COMMUNITY)
Admission: RE | Admit: 2022-12-09 | Discharge: 2022-12-09 | Disposition: A | Discharge status: Home / Self Care | Payer: No Typology Code available for payment source | Source: Ambulatory Visit | Attending: General Surgery

## 2022-12-09 ENCOUNTER — Encounter (HOSPITAL_COMMUNITY)
Admission: RE | Admit: 2022-12-09 | Discharge: 2022-12-09 | Disposition: A | Discharge status: Home / Self Care | Payer: No Typology Code available for payment source | Source: Ambulatory Visit | Attending: General Surgery | Admitting: General Surgery

## 2022-12-09 ENCOUNTER — Ambulatory Visit (HOSPITAL_COMMUNITY)
Admission: RE | Admit: 2022-12-09 | Discharge: 2022-12-09 | Disposition: A | Discharge status: Home / Self Care | Payer: No Typology Code available for payment source | Source: Ambulatory Visit | Attending: General Surgery | Admitting: General Surgery

## 2022-12-09 DIAGNOSIS — E569 Vitamin deficiency, unspecified: Secondary | ICD-10-CM | POA: Insufficient documentation

## 2022-12-09 DIAGNOSIS — Z01818 Encounter for other preprocedural examination: Secondary | ICD-10-CM

## 2022-12-09 DIAGNOSIS — K2100 Gastro-esophageal reflux disease with esophagitis, without bleeding: Secondary | ICD-10-CM | POA: Insufficient documentation

## 2022-12-19 ENCOUNTER — Other Ambulatory Visit (HOSPITAL_COMMUNITY): Payer: Self-pay

## 2022-12-27 ENCOUNTER — Ambulatory Visit: Payer: PRIVATE HEALTH INSURANCE | Admitting: Dietician

## 2023-01-02 ENCOUNTER — Ambulatory Visit: Payer: PRIVATE HEALTH INSURANCE | Admitting: Dietician

## 2023-01-12 ENCOUNTER — Ambulatory Visit (INDEPENDENT_AMBULATORY_CARE_PROVIDER_SITE_OTHER): Payer: 59 | Admitting: Adult Health

## 2023-01-12 ENCOUNTER — Encounter: Payer: Self-pay | Admitting: Adult Health

## 2023-01-12 VITALS — BP 122/83 | HR 81 | Ht 63.5 in | Wt 254.0 lb

## 2023-01-12 DIAGNOSIS — Z803 Family history of malignant neoplasm of breast: Secondary | ICD-10-CM

## 2023-01-12 DIAGNOSIS — N6321 Unspecified lump in the left breast, upper outer quadrant: Secondary | ICD-10-CM | POA: Insufficient documentation

## 2023-01-12 NOTE — Progress Notes (Signed)
  Subjective:     Patient ID: [name], female   DOB: 1985/08/23, 37 y.o.   MRN: [id number]  HPI [name] is a 37 year old white female,married, [id number] in complaining of left breast lump, has noticed for about 3 weeks and feels bigger, no known injury but has yellowish bruise over lump. Sister just diagnosed with breast cancer.     Component Value Date/Time   DIAGPAP  02/12/2021 1128    - Negative for intraepithelial lesion or malignancy (NILM)   HPVHIGH Negative 02/12/2021 1128   ADEQPAP  02/12/2021 1128    Satisfactory for evaluation; transformation zone component PRESENT.   PCP is [employer] [facility].  Review of Systems Left breast lump for about 3 weeks  Reviewed past medical,surgical, social and family history. Reviewed medications and allergies.     Objective:   Physical Exam BP 122/83 (BP Location: Right Arm, Patient Position: Sitting, Cuff Size: Normal)   Pulse 81   Ht 5' 3.5" (1.613 m)   Wt 254 lb (115.2 kg)   BMI 44.29 kg/m      Skin warm and dry,  Breasts:no dominate palpable mass, retraction or nipple discharge on the right, on the left no retraction or nipple discharge, has mass at 1-2 0'clock about 4 FB from areola, non tender and has yellowish bruise over mass.  Fall risk is low  [facility] - 01/12/23 1102       Pregnancy Intention Screening   Does the patient want to become pregnant in the next year? No    Does the patient's partner want to become pregnant in the next year? No    Would the patient like to discuss contraceptive options today? No      Contraception Wrap Up   Current Method Female Sterilization    End Method Female Sterilization    Contraception Counseling Provided No             Assessment:     1. Mass of upper outer quadrant of left breast Has mass in left breast at 1-2 0'clock about 4 FB from areola, non tender, and has yellowish bruise over mass, no known injury  Diagnostic mammogram and [place] scheduled for 01/17/23 at 8:20 am at  [facility]  - [place] LIMITED ULTRASOUND INCLUDING AXILLA RIGHT BREAST; Future - MM 3D DIAGNOSTIC MAMMOGRAM BILATERAL BREAST; Future - [place] LIMITED ULTRASOUND INCLUDING AXILLA LEFT BREAST ; Future  2. Family history of breast cancer in sister     Plan:     Follow up prn

## 2023-01-13 ENCOUNTER — Ambulatory Visit: Payer: PRIVATE HEALTH INSURANCE | Admitting: Dietician

## 2023-01-17 ENCOUNTER — Ambulatory Visit (HOSPITAL_COMMUNITY)
Admission: RE | Admit: 2023-01-17 | Discharge: 2023-01-17 | Disposition: A | Discharge status: Home / Self Care | Payer: 59 | Source: Ambulatory Visit | Attending: Adult Health | Admitting: Adult Health

## 2023-01-17 DIAGNOSIS — N6321 Unspecified lump in the left breast, upper outer quadrant: Secondary | ICD-10-CM | POA: Insufficient documentation

## 2023-01-18 ENCOUNTER — Telehealth: Payer: Self-pay | Admitting: Adult Health

## 2023-01-18 NOTE — Telephone Encounter (Signed)
Has had yellow discharge from nipple, tell nurse at next visit and will check TSH and prolactin before exam and if any discharge will send a slide to pathology, she had questions about genetic testing. Can refer if desired.

## 2023-02-08 ENCOUNTER — Ambulatory Visit: Payer: 59 | Admitting: Adult Health

## 2023-02-14 ENCOUNTER — Encounter: Payer: Self-pay | Admitting: Dietician

## 2023-02-14 ENCOUNTER — Encounter: Payer: 59 | Attending: General Surgery | Admitting: Dietician

## 2023-02-14 VITALS — Ht 63.0 in | Wt 249.0 lb

## 2023-02-14 DIAGNOSIS — E669 Obesity, unspecified: Secondary | ICD-10-CM | POA: Diagnosis present

## 2023-02-14 DIAGNOSIS — Z713 Dietary counseling and surveillance: Secondary | ICD-10-CM | POA: Diagnosis not present

## 2023-02-14 DIAGNOSIS — Z6841 Body Mass Index (BMI) 40.0 and over, adult: Secondary | ICD-10-CM | POA: Insufficient documentation

## 2023-02-14 NOTE — Progress Notes (Signed)
Nutrition Assessment for Bariatric Surgery: Pre-Surgery Behavioral and Nutrition Intervention Program   Medical Nutrition Therapy  Appt Start Time: 9:54    End Time: 10:57  Patient was seen on 02/14/2023 for Pre-Operative Nutrition Assessment. Purpose of todays visit  enhance perioperative outcomes along with a healthy weight maintenance   Referral stated Supervised Weight Loss (SWL) visits needed: 10  Planned surgery: RYGB Pt expectation of surgery: to be more active and confident   NUTRITION ASSESSMENT   Anthropometrics  Start weight at [facility]: 249 lbs (date: 02/14/2023)  Height: 63 in BMI: 44.11 kg/m2     Clinical   Pharmacotherapy: History of weight loss medication used: took something once, stating she had rapid heart rate and stopped.  Medical hx: obesity Medications: multivitamin, buspirone Labs: see EMR (WNL from March 2024) Notable signs/symptoms: none noted Any previous deficiencies? 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 Pt is requesting virtual SWL visits. Pt agreeable to every 3rd visit being in person.  Current Physical Activity Recommendations state 150 minutes per week of moderate to vigorous movement including Cardio and 1-2 days of resistance activities as well as flexibility/balance activities:  Pts current physical activity: gym 2 days a week, 90 minutes, with 100% recommendation reached   Sleep Hygiene: duration and quality: hard time falling asleep; about 4 hours a night  Current Patient Perceived Stress Level as stated by pt on a scale of 1-10:  6-7       Stress Management Techniques: gym  offers some relief when going.  According to the Dietary Guidelines for Americans Recommendation: equivalent 1.5-2 cups fruits per day, equivalent 2-3 cups vegetables per day and at least half all grains whole  Fruit servings per day (on average): 0-1, meeting 0-50% recommendation  Non-starchy vegetable servings per day (on average): 1, meeting 33-50% recommendation  Whole Grains per day (on average): 0  Number of meals missed/skipped per week out of 21: 10-14  24-Hr Dietary Recall First Meal: coffee skip or eggs and bacon Snack: some times chips or crackers or yogurt Second Meal: skip or sandwich (prepared from home), chips Snack: some times chips or crackers or yogurt Third Meal: meat, vegetable, potatoes or rice Snack: some times chips or crackers or yogurt Beverages: tea (sweet or un-sweet), water, diet soda (used to be regular soda)  Alcoholic beverages per week: 1 every 2-3 months   Estimated Energy Needs Calories: 1500  NUTRITION DIAGNOSIS  Overweight/obesity ([place]-3.3) related to past poor dietary habits and physical inactivity as evidenced by patient w/ planned RYGB surgery following dietary guidelines for continued weight loss.  NUTRITION INTERVENTION  Nutrition counseling (C-1) and education (E-2) to facilitate bariatric surgery goals.  Educated pt on micronutrient deficiencies post-surgery and behavioral/dietary strategies to start in order to mitigate that risk   Behavioral and Dietary Interventions Pre-Op Goals Reviewed with the Patient Nutrition: Healthy Eating Behaviors Switch to non-caloric, non-carbonated and non-caffeinated beverages such as  water, unsweetened tea, Crystal Light and zero calorie beverages (aim for 64 oz. per day) Cut out grazing between meals or at night  Find a protein shake you like Eat every 3-5 hours        Eliminate distractions while  eating (TV, computer, reading, driving, texting) Take 16-10 minutes to eat a meal  Decrease high sugar  foods/decrease high fat/fried foods Eliminate alcoholic beverages Increase protein intake (eggs, fish, chicken, yogurt) before surgery Eat non starchy vegetables 2 times a day 7 days a week Eat complex carbohydrates such as whole grains and fruits   Behavioral Modification: Physical Activity Increase my usual daily activity (use stairs, park farther, etc.) Engage in __________Gym_____________  activity  ___30____ minutes __5____ times per week  Other:    _________________________________________________________________     Problem Solving I will think about my usual eating patterns and how to tweak them How can my friends and family support me Barriers to starting my changes Learn and understand appetite verses hunger   Healthy Coping Allow for ___________ activities per week to help me manage stress Reframe negative thoughts I will keep a picture of someone or something that is my inspiration & look at it daily   Monitoring  Weigh myself once a week  Measure my progress by monitoring how my clothes fit Keep a food record of what I eat and drink for the next ________ (time period) Take pictures of what I eat and drink for the next ________ (time period) Use an app to count steps/day for the next_______ (time period) Measure my progress such as increased energy and more restful sleep Monitor your acid reflux and bowel habits, are they getting better?   *Goals that are bolded indicate the pt would like to start working towards these  Handouts Provided Include  Bariatric Surgery handouts (Nutrition Visits, Pre Surgery Behavioral Change Goals, Protein Shakes Brands to Choose From, Vitamins & Mineral Supplementation)  Learning Style & Readiness for Change Teaching method utilized: Visual, Auditory, and hands on  Demonstrated degree of understanding via: Teach Back  Readiness Level: preparation Barriers to learning/adherence to lifestyle change: busy work schedules (two jobs)  RD's  Notes for Next Visit Patient progress toward chosen goals.    MONITORING & EVALUATION Dietary intake, weekly physical activity, body weight, and preoperative behavioral change goals   Next Steps  Patient is to follow up in two weeks for first SWL visit.

## 2023-02-17 ENCOUNTER — Other Ambulatory Visit (HOSPITAL_COMMUNITY): Payer: Self-pay

## 2023-02-28 ENCOUNTER — Ambulatory Visit: Payer: 59 | Admitting: Dietician

## 2023-02-28 ENCOUNTER — Encounter: Payer: Self-pay | Admitting: Dietician

## 2023-02-28 DIAGNOSIS — E669 Obesity, unspecified: Secondary | ICD-10-CM

## 2023-02-28 NOTE — Progress Notes (Addendum)
Supervised Weight Loss Visit Bariatric Nutrition Education Appt Start Time: 9:04    End Time: 9:25  Virtual Visit via Video Note  I connected with [name] on 02/28/23 at  9:15 AM EST by a video enabled visit and verified that I am speaking with the correct person using two identifiers.  Planned surgery: RYGB Pt expectation of surgery: to be more active and confident  1 out of 10 SWL Appointments   NUTRITION ASSESSMENT   Anthropometrics  Start weight at [facility]: 249 lbs (date: 02/14/2023)  Height: 63 in Weight today: virtual visit BMI: 44.11 kg/m2     Clinical   Pharmacotherapy: History of weight loss medication used: took something once, stating she had rapid heart rate and stopped.  Medical hx: obesity Medications: multivitamin, buspirone Labs: see EMR (WNL from March 2024) Notable signs/symptoms: none noted Any previous deficiencies? No  Lifestyle & Dietary Hx  Pt states she has not had alcohol since last visit. Pt states she has not been eating breakfast, stating she is eating lunch even if it is just a fruit or yogurt. Pt states she is willing to get a protein shake or something small in at breakfast  Estimated daily fluid intake: 42 oz Supplements: daily multivitamin Current average weekly physical activity: Gym, 4 days a week, 30-40 minutes  24-Hr Dietary Recall First Meal: coffee skip or eggs and bacon Snack: some times chips or crackers or yogurt Second Meal: skip or sandwich (prepared from home), chips Snack: some times chips or crackers or yogurt Third Meal: meat, vegetable, potatoes or rice Snack: some times chips or crackers or yogurt Beverages: water, un-sweet tea  Estimated Energy Needs Calories: 1500  NUTRITION DIAGNOSIS  Overweight/obesity ([place]-3.3) related to past poor dietary habits and physical inactivity as evidenced by patient w/ planned RYGB surgery following dietary guidelines for continued weight loss.  NUTRITION INTERVENTION   Nutrition counseling (C-1) and education (E-2) to facilitate bariatric surgery goals.  Pre-Op Goals Reviewed with the Patient Nutrition: Healthy Eating Behaviors Switch to non-caloric, non-carbonated and non-caffeinated beverages such as  water, unsweetened tea, Crystal Light and zero calorie beverages (aim for 64 oz. per day) Cut out grazing between meals or at night  Find a protein shake you like Eat every 3-5 hours        Eliminate distractions while eating (TV, computer, reading, driving, texting) Take 38-75 minutes to eat a meal  Decrease high sugar foods/decrease high fat/fried foods Eliminate alcoholic beverages Increase protein intake (eggs, fish, chicken, yogurt) before surgery Eat non starchy vegetables 2 times a day 7 days a week Eat complex carbohydrates such as whole grains and fruits   Behavioral Modification: Physical Activity Increase my usual daily activity (use stairs, park farther, etc.) Engage in __________Gym_____________  activity  ___30____ minutes __5____ times per week  Other:    _________________________________________________________________     Problem Solving I will think about my usual eating patterns and how to tweak them How can my friends and family support me Barriers to starting my changes Learn and understand appetite verses hunger   Healthy Coping Allow for ___________ activities per week to help me manage stress Reframe negative thoughts I will keep a picture of someone or something that is my inspiration & look at it daily   Monitoring  Weigh myself once a week  Measure my progress by monitoring how my clothes fit Keep a food record of what I eat and drink for the next ________ (time period) Take pictures of what  I eat and drink for the next ________ (time period) Use an app to count steps/day for the next_______ (time period) Measure my progress such as increased energy and more restful sleep Monitor your acid reflux and bowel habits,  are they getting better?   *Goals that are bolded indicate the pt would like to start working towards these  Pre-Op Goals Progress & New Goals Continue: Aim for 64 oz of hydrating fluids per day; no sugary beverages, and no sodas. Continue: Avoid alcohol Continue: Increase physical activity; aim for 30 minutes, 5 days a week. New: eat throughout the day; avoid skipping meals.  Handouts Provided Include    Learning Style & Readiness for Change Teaching method utilized: Visual & Auditory  Demonstrated degree of understanding via: Teach Back  Readiness Level: preparation Barriers to learning/adherence to lifestyle change: busy schedule; working two jobs  RD's Notes for next Visit  Patient progress toward chosen goals.    MONITORING & EVALUATION Dietary intake, weekly physical activity, body weight, and pre-op goals in 1 month.   Next Steps  Patient is to return to [facility] in 2 weeks for next SWL visit.

## 2023-03-07 ENCOUNTER — Encounter: Payer: Self-pay | Admitting: Dietician

## 2023-03-07 ENCOUNTER — Encounter: Payer: 59 | Attending: General Surgery | Admitting: Dietician

## 2023-03-07 DIAGNOSIS — E669 Obesity, unspecified: Secondary | ICD-10-CM | POA: Diagnosis present

## 2023-03-07 NOTE — Progress Notes (Signed)
Supervised Weight Loss Visit Bariatric Nutrition Education  Virtual Visit via Video Note  I connected with Fabio Asa on 03/07/23 at  8:00 AM EST by a video enabled visit and verified that I am speaking with the correct person using two identifiers.  Planned surgery: RYGB Pt expectation of surgery: to be more active and confident  1 out of 10 SWL Appointments   NUTRITION ASSESSMENT   Anthropometrics  Start weight at [facility]: 249 lbs (date: 02/14/2023)  Height: 63 in Weight today: virtual visit BMI: 44.11 kg/m2     Clinical   Pharmacotherapy: History of weight loss medication used: took something once, stating she had rapid heart rate and stopped.  Medical hx: obesity Medications: multivitamin, buspirone Labs: see EMR (WNL from March 2024) Notable signs/symptoms: none noted Any previous deficiencies? No  Lifestyle & Dietary Hx  Pt states she is still avoiding alcohol, and states she is getting almost 64 oz of fluids. Pt states she is at the gym 5 days a week now. Pt states she is still working on eating breakfast daily. Pt states she is in bed about 10 pm and up at 5:30 (M-F). Pt agreeable to take a lunch box with yogurt and fruit for breakfast.  Estimated daily fluid intake: 60 oz Supplements: daily multivitamin Current average weekly physical activity: Gym, 5 days a week, 30-40 minutes  24-Hr Dietary Recall First Meal: coffee skip or eggs and bacon Snack: some times chips or crackers or yogurt Second Meal: skip or sandwich (prepared from home), chips Snack: some times chips or crackers or yogurt Third Meal: meat, vegetable, potatoes or rice Snack: some times chips or crackers or yogurt Beverages: water, un-sweet tea  Estimated Energy Needs Calories: 1500  NUTRITION DIAGNOSIS  Overweight/obesity ([place]-3.3) related to past poor dietary habits and physical inactivity as evidenced by patient w/ planned RYGB surgery following dietary guidelines for continued weight  loss.  NUTRITION INTERVENTION  Nutrition counseling (C-1) and education (E-2) to facilitate bariatric surgery goals.  Encouraged patient to honor their body's internal hunger and fullness cues.  Throughout the day, check in mentally and rate hunger. Stop eating when satisfied not full regardless of how much food is left on the plate.  Get more if still hungry 20-30 minutes later.  The key is to honor satisfaction so throughout the meal, rate fullness factor and stop when comfortably satisfied not physically full. The key is to honor hunger and fullness without any feelings of guilt or shame.  Pay attention to what the internal cues are, rather than any external factors. This will enhance the confidence you have in listening to your own body and following those internal cues enabling you to increase how often you eat when you are hungry not out of appetite and stop when you are satisfied not full.  Encouraged pt to continue to drink a minium 64 fluid ounces with half being plain water to satisfy proper hydration    Pre-Op Goals Progress & New Goals Continue: Aim for 64 oz of hydrating fluids per day; no sugary beverages, and no sodas. Continue: Avoid alcohol Continue: Increase physical activity; aim for 30 minutes, 5 days a week. Continue: eat throughout the day; avoid skipping meals. New: Take a lunch box with yogurt and fruit. New: aim for feeling of satisfaction before fullness. Take 20-30 minutes to eat.  Handouts Provided Include    Learning Style & Readiness for Change Teaching method utilized: Visual & Auditory  Demonstrated degree of understanding via: Teach Back  Readiness Level: preparation Barriers to learning/adherence to lifestyle change: busy schedule; working two jobs  RD's Notes for next Visit  Patient progress toward chosen goals.    MONITORING & EVALUATION Dietary intake, weekly physical activity, body weight, and pre-op goals in 1 month.   Next Steps  Patient is to  return to [facility] in 2 weeks for next SWL visit.

## 2023-03-13 ENCOUNTER — Ambulatory Visit: Payer: 59 | Admitting: Adult Health

## 2023-03-21 ENCOUNTER — Encounter: Payer: 59 | Admitting: Dietician

## 2023-03-21 ENCOUNTER — Encounter: Payer: Self-pay | Admitting: Dietician

## 2023-03-21 DIAGNOSIS — E669 Obesity, unspecified: Secondary | ICD-10-CM

## 2023-03-21 NOTE — Progress Notes (Signed)
Supervised Weight Loss Visit Bariatric Nutrition Education  Virtual Visit via Video Note  I connected with [name] on 03/21/23 at  9:30 AM EST by a video enabled visit and verified that I am speaking with the correct person using two identifiers.  Planned surgery: RYGB Pt expectation of surgery: to be more active and confident  3 out of 10 SWL Appointments   NUTRITION ASSESSMENT   Anthropometrics  Start weight at [facility]: 249 lbs (date: 02/14/2023)  Height: 63 in Weight today: virtual visit BMI: 44.11 kg/m2     Clinical   Pharmacotherapy: History of weight loss medication used: took something once, stating she had rapid heart rate and stopped.  Medical hx: obesity Medications: multivitamin, buspirone Labs: see EMR (WNL from March 2024) Notable signs/symptoms: none noted Any previous deficiencies? No  Lifestyle & Dietary Hx  Pt states she has been taking her lunch, stating except the days she forgets it. Pt states she has been slowing down at mealtime and aiming for feelings of satisfaction. Pt is doing well with physical activity, stating she is going to the gym 4-5 days per week for 30 minutes.  Estimated daily fluid intake: 62 oz Supplements: daily multivitamin Current average weekly physical activity: Gym (treadmill, elliptical, stair climber, weights), 4-5 days a week, 30 minutes  24-Hr Dietary Recall First Meal: coffee skip (3 times per week) or eggs and bacon Snack: not lately Second Meal: skip or sandwich (prepared from home with whole grains), chips, fruit Snack: not lately Third Meal: meat, vegetable, potatoes or rice Snack: rarely Beverages: water, un-sweet tea  Estimated Energy Needs Calories: 1500  NUTRITION DIAGNOSIS  Overweight/obesity ([place]-3.3) related to past poor dietary habits and physical inactivity as evidenced by patient w/ planned RYGB surgery following dietary guidelines for continued weight loss.  NUTRITION INTERVENTION  Nutrition  counseling (C-1) and education (E-2) to facilitate bariatric surgery goals.  Encouraged patient to honor their body's internal hunger and fullness cues.  Throughout the day, check in mentally and rate hunger. Stop eating when satisfied not full regardless of how much food is left on the plate.  Get more if still hungry 20-30 minutes later.  The key is to honor satisfaction so throughout the meal, rate fullness factor and stop when comfortably satisfied not physically full. The key is to honor hunger and fullness without any feelings of guilt or shame.  Pay attention to what the internal cues are, rather than any external factors. This will enhance the confidence you have in listening to your own body and following those internal cues enabling you to increase how often you eat when you are hungry not out of appetite and stop when you are satisfied not full.  Encouraged pt to continue to drink a minium 64 fluid ounces with half being plain water to satisfy proper hydration    Pre-Op Goals Progress & New Goals Continue: Aim for 64 oz of hydrating fluids per day; no sugary beverages, and no sodas. Continue: Avoid alcohol Continue: Increase physical activity; aim for 30 minutes, 5 days a week. Re-engage: eat throughout the day; avoid skipping meals. New: Take a lunch box with yogurt and fruit. Continue: aim for feeling of satisfaction before fullness. Take 20-30 minutes to eat. New: Track protein; aim for 60 grams per day; have a protein with every meal and snack; avoid skipping meals  Handouts Provided Include    Learning Style & Readiness for Change Teaching method utilized: Visual & Auditory  Demonstrated degree of understanding via:  Teach Back  Readiness Level: contemplative Barriers to learning/adherence to lifestyle change: busy schedule; working two jobs  RD's Notes for next Visit  Patient progress toward chosen goals.   MONITORING & EVALUATION Dietary intake, weekly physical activity,  body weight, and pre-op goals in 1 month.   Next Steps  Patient is to return to [facility] in 2 weeks for next SWL visit.

## 2023-04-11 ENCOUNTER — Encounter: Payer: Self-pay | Admitting: Dietician

## 2023-04-11 ENCOUNTER — Encounter: Payer: 59 | Attending: General Surgery | Admitting: Dietician

## 2023-04-11 VITALS — Ht 63.0 in | Wt 255.5 lb

## 2023-04-11 DIAGNOSIS — E669 Obesity, unspecified: Secondary | ICD-10-CM | POA: Diagnosis present

## 2023-04-11 NOTE — Progress Notes (Signed)
 Supervised Weight Loss Visit Bariatric Nutrition Education  Planned surgery: RYGB Pt expectation of surgery: to be more active and confident  4 out of 10 SWL Appointments  NUTRITION ASSESSMENT   Anthropometrics  Start weight at [facility]: 249 lbs (date: 02/14/2023)  Height: 63 in Weight today: 255.5 lb BMI: 44.11 kg/m2     Clinical   Pharmacotherapy: History of weight loss medication used: took something once, stating she had rapid heart rate and stopped.  Medical hx: obesity Medications: multivitamin, buspirone Labs: see EMR (WNL from March 2024) Notable signs/symptoms: none noted Any previous deficiencies? No  Lifestyle & Dietary Hx  Pt states she has not been at the gym for the past week and a half. 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 Pt states she has not been tracking her. Pt states she has not had an alcoholic drink since the first visit (assessment).  Estimated daily fluid intake: 64 oz Supplements: daily multivitamin Current average weekly physical activity: Gym (treadmill, elliptical, stair climber, weights), 4-5 days a week, 30 minutes  24-Hr Dietary Recall First Meal: coffee skip (3 times per week) or eggs and bacon Snack: not lately Second Meal: skip or sandwich (prepared from home with whole grains), chips, fruit Snack: not lately Third Meal: meat, vegetable, potatoes or rice Snack: rarely Beverages: water, un-sweet tea  Estimated Energy Needs Calories: 1500  NUTRITION DIAGNOSIS  Overweight/obesity ([place]-3.3) related to past poor dietary habits and physical inactivity as evidenced by patient w/ planned RYGB surgery following dietary guidelines for continued weight loss.  NUTRITION INTERVENTION  Nutrition counseling (C-1) and education (E-2) to facilitate bariatric surgery goals.  Encouraged patient to honor their body's internal hunger and fullness cues.  Throughout the  day, check in mentally and rate hunger. Stop eating when satisfied not full regardless of how much food is left on the plate.  Get more if still hungry 20-30 minutes later.  The key is to honor satisfaction so throughout the meal, rate fullness factor and stop when comfortably satisfied not physically full. The key is to honor hunger and fullness without any feelings of guilt or shame.  Pay attention to what the internal cues are, rather than any external factors. This will enhance the confidence you have in listening to your own body and following those internal cues enabling you to increase how often you eat when you are hungry not out of appetite and stop when you are satisfied not full.  Encouraged pt to continue to drink a minium 64 fluid ounces with half being plain water to satisfy proper hydration. Encouraged pt to continue to eat balanced meals inclusive of non starchy vegetables 2 times a day 7 days a week Encouraged pt to choose lean protein sources: limiting beef, pork, sausage, hotdogs, and lunch meat Encouraged pt to continue to drink a minium 64 fluid ounces with half being plain water to satisfy proper hydration    Pre-Op Goals Progress & New Goals Continue: Aim for 64 oz of hydrating fluids per day; no sugary beverages, and no sodas. Continue: Avoid alcohol Continue: Increase physical activity; aim for 30 minutes, 5 days a week. Re-engage: eat throughout the day; avoid skipping meals. New: Take a lunch box with yogurt and fruit. Continue: aim for feeling of satisfaction before fullness. Take 20-30 minutes to eat. Re-engage: Track protein; aim for 60 grams per day; have a protein with every meal and snack; avoid  skipping meals  Handouts Provided Include  2nd copy of all Assessment handouts Goals printed Protein Foods List with value Meal Ideas Bariatric MyPlate  Learning Style & Readiness for Change Teaching method utilized: Visual & Auditory  Demonstrated degree of  understanding via: Teach Back  Readiness Level: contemplative Barriers to learning/adherence to lifestyle change: busy schedule; working two jobs  RD's Notes for next Visit  Patient progress toward chosen goals.   MONITORING & EVALUATION Dietary intake, weekly physical activity, body weight, and pre-op goals in 1 month.   Next Steps  Patient is to return to [facility] in 2 weeks for next SWL visit.

## 2023-04-25 ENCOUNTER — Telehealth: Payer: 59 | Admitting: Dietician

## 2023-05-01 ENCOUNTER — Ambulatory Visit: Payer: 59 | Admitting: Adult Health

## 2023-05-02 ENCOUNTER — Encounter: Payer: Self-pay | Admitting: Dietician

## 2023-05-02 ENCOUNTER — Encounter: Payer: 59 | Attending: General Surgery | Admitting: Dietician

## 2023-05-02 DIAGNOSIS — E669 Obesity, unspecified: Secondary | ICD-10-CM | POA: Insufficient documentation

## 2023-05-02 NOTE — Progress Notes (Signed)
Supervised Weight Loss Visit Bariatric Nutrition Education  Planned surgery: RYGB Pt expectation of surgery: to be more active and confident  I connected with Fabio Asa by a video enabled visit and verified that I am speaking with the correct person using two identifiers.  5 out of 10 SWL Appointments  NUTRITION ASSESSMENT   Anthropometrics  Start weight at [facility]: 249 lbs (date: 02/14/2023)  Height: 63 in Weight today: virtual visit BMI: 44.11 kg/m2     Clinical   Pharmacotherapy: History of weight loss medication used: took something once, stating she had rapid heart rate and stopped.  Medical hx: obesity Medications: multivitamin, buspirone Labs: see EMR (WNL from March 2024) Notable signs/symptoms: none noted Any previous deficiencies? No  Lifestyle & Dietary Hx  Pt states she is tracking protein and eating breakfast. Pt states she is getting 60 grams per day of protein.  Pt states she is taking her lunch with yogurt and fruit. Pt states she is still avoiding alcohol. Pt agreeable to practice not drinking fluids with her meals.  Estimated daily fluid intake: 64 oz Supplements: daily multivitamin Current average weekly physical activity: Gym (treadmill, elliptical, stair climber, weights), 3 days a week, 45-60 minutes  24-Hr Dietary Recall First Meal: coffee skip (3 times per week) or eggs and bacon Snack: not lately Second Meal: skip or sandwich (prepared from home with whole grains), chips, fruit Snack: not lately Third Meal: meat, vegetable, potatoes or rice Snack: rarely Beverages: water, un-sweet tea  Estimated Energy Needs Calories: 1500  NUTRITION DIAGNOSIS  Overweight/obesity ([place]-3.3) related to past poor dietary habits and physical inactivity as evidenced by patient w/ planned RYGB surgery following dietary guidelines for continued weight loss.  NUTRITION INTERVENTION  Nutrition counseling (C-1) and education (E-2) to facilitate bariatric  surgery goals.  Encouraged patient to honor their body's internal hunger and fullness cues.  Throughout the day, check in mentally and rate hunger. Stop eating when satisfied not full regardless of how much food is left on the plate.  Get more if still hungry 20-30 minutes later.  The key is to honor satisfaction so throughout the meal, rate fullness factor and stop when comfortably satisfied not physically full. The key is to honor hunger and fullness without any feelings of guilt or shame.  Pay attention to what the internal cues are, rather than any external factors. This will enhance the confidence you have in listening to your own body and following those internal cues enabling you to increase how often you eat when you are hungry not out of appetite and stop when you are satisfied not full.  Encouraged pt to continue to drink a minium 64 fluid ounces with half being plain water to satisfy proper hydration. Encouraged pt to continue to eat balanced meals inclusive of non starchy vegetables 2 times a day 7 days a week Encouraged pt to choose lean protein sources: limiting beef, pork, sausage, hotdogs, and lunch meat Encouraged pt to continue to drink a minium 64 fluid ounces with half being plain water to satisfy proper hydration   After bariatric surgery, you should not drink liquids with your meal or for 30 minutes before or after your meal. This is because drinking with a meal can cause you to feel full too quickly, which can lead to discomfort, nausea, or vomiting. Practicing now, will help get used to not drinking with meals and help to be more successful after surgery.  Pre-Op Goals Progress & New Goals Continue: Aim for 21  oz of hydrating fluids per day; no sugary beverages, and no sodas. Continue: Avoid alcohol Continue: Increase physical activity; aim for 30 minutes, 5 days a week; aim for 150 minutes per week. Continue: eat throughout the day; avoid skipping meals. New: Take a lunch box  with yogurt and fruit. Continue: aim for feeling of satisfaction before fullness. Take 20-30 minutes to eat. Continue: Track protein; aim for 60 grams per day; have a protein with every meal and snack; avoid skipping meals New: practice not drinking with meals  Handouts Provided Include    Learning Style & Readiness for Change Teaching method utilized: Visual & Auditory  Demonstrated degree of understanding via: Teach Back  Readiness Level: contemplative Barriers to learning/adherence to lifestyle change: busy schedule; working two jobs  RD's Notes for next Visit  Patient progress toward chosen goals.   MONITORING & EVALUATION Dietary intake, weekly physical activity, body weight, and pre-op goals in 1 month.   Next Steps  Patient is to return to [facility] in 2 weeks for next SWL visit.

## 2023-05-04 ENCOUNTER — Ambulatory Visit: Payer: 59 | Admitting: Adult Health

## 2023-05-04 ENCOUNTER — Encounter: Payer: Self-pay | Admitting: Adult Health

## 2023-05-04 VITALS — BP 127/87 | HR 80 | Ht 62.0 in | Wt 245.0 lb

## 2023-05-04 DIAGNOSIS — N6311 Unspecified lump in the right breast, upper outer quadrant: Secondary | ICD-10-CM | POA: Diagnosis not present

## 2023-05-04 DIAGNOSIS — N6452 Nipple discharge: Secondary | ICD-10-CM | POA: Diagnosis not present

## 2023-05-04 NOTE — Progress Notes (Signed)
  Subjective:     Patient ID: [name], female   DOB: 14-Sep-1985, 38 y.o.   MRN: [id number]  HPI [name] is a 38 year old white female, married, [id number], in complaining of nipple discharge, was greenish from right nipple, and both nipples itch, R>L and skin dry, and ?mass right breast. Had Mammogram 01/17/23 ? Fat necrosis left breast.     Component Value Date/Time   DIAGPAP  02/12/2021 1128    - Negative for intraepithelial lesion or malignancy (NILM)   HPVHIGH Negative 02/12/2021 1128   ADEQPAP  02/12/2021 1128    Satisfactory for evaluation; transformation zone component PRESENT.   PCP is [facility].  Review of Systems +nipple discharge, was greenish from right nipple, and both nipples itch, R>L and skin dry, and ?mass right breast. Had Mammogram 01/17/23 ? Fat necrosis left breast.    Reviewed past medical,surgical, social and family history. Reviewed medications and allergies.  Objective:   Physical Exam BP 127/87 (BP Location: Left Arm, Patient Position: Sitting, Cuff Size: Normal)   Pulse 80   Ht 5\' 2"  (1.575 m)   Wt 245 lb (111.1 kg)   LMP 05/03/2023   BMI 44.81 kg/m      Skin warm and dry,  Breasts:no dominate palpable mass, retraction or nipple discharge on the left, has some regular irregularities, on the right, there was no nipple discharge, or retraction where is dry scaly skin on areola, and a mass at 11 0'clock about 4 FB from areola that is tender. Fall risk is low  Upstream - 05/04/23 1143       Pregnancy Intention Screening   Does the patient want to become pregnant in the next year? No    Does the patient's partner want to become pregnant in the next year? No    Would the patient like to discuss contraceptive options today? No      Contraception Wrap Up   Current Method Female Sterilization    End Method Female Sterilization    Contraception Counseling Provided No             Assessment:     1. Breast discharge TSH and Prolactin drawn  before exam  No nipple discharge on exam - TSH - Prolactin - [place] LIMITED ULTRASOUND INCLUDING AXILLA RIGHT BREAST; Future - MM 3D DIAGNOSTIC MAMMOGRAM UNILATERAL RIGHT BREAST; Future Put lotion on areola skin to help with dryness  2. Mass of upper outer quadrant of right breast (Primary)  +mass at 11 0'clock about 4 FB from areola that is tender. Right diagnostic mammogram and [place] scheduled for 06/27/23 at 2:20 pm at [facility] , they will if gets sooner appt - [place] LIMITED ULTRASOUND INCLUDING AXILLA RIGHT BREAST; Future - MM 3D DIAGNOSTIC MAMMOGRAM UNILATERAL RIGHT BREAST; Future     Plan:     Return in 10 months for pap

## 2023-05-05 ENCOUNTER — Telehealth: Payer: Self-pay | Admitting: Adult Health

## 2023-05-05 LAB — TSH: TSH: 1.58 u[IU]/mL (ref 0.450–4.500)

## 2023-05-05 LAB — PROLACTIN: Prolactin: 5.1 ng/mL (ref 4.8–33.4)

## 2023-05-05 NOTE — Telephone Encounter (Signed)
Called [name], had call from [facility] for diagnostic mammogram 05/24/23 did not want to wait for [facility] appt in March, then got order for [facility] for 05/09/23 at 10:15 am, she is nervous and wants mammogram ASAP, sister had breast cancer. Will sign order for [facility]

## 2023-05-09 ENCOUNTER — Encounter: Payer: Self-pay | Admitting: Dietician

## 2023-05-09 ENCOUNTER — Encounter: Payer: 59 | Attending: General Surgery | Admitting: Dietician

## 2023-05-09 DIAGNOSIS — E669 Obesity, unspecified: Secondary | ICD-10-CM | POA: Insufficient documentation

## 2023-05-09 NOTE — Progress Notes (Signed)
 Supervised Weight Loss Visit Bariatric Nutrition Education  Planned surgery: RYGB Pt expectation of surgery: to be more active and confident  I connected with [name] by a video enabled visit and verified that I am speaking with the correct person using two identifiers.  6 out of 10 SWL Appointments  NUTRITION ASSESSMENT   Anthropometrics  Start weight at [facility]: 249 lbs (date: 02/14/2023)  Height: 63 in Weight today: virtual visit BMI: 44.11 kg/m2     Clinical   Pharmacotherapy: History of weight loss medication used: took something once, stating she had rapid heart rate and stopped.  Medical hx: obesity Medications: multivitamin, buspirone Labs: see EMR (WNL from March 2024) Notable signs/symptoms: none noted Any previous deficiencies? No  Lifestyle & Dietary Hx  Pt called from another appointment, stating she may have to get off soon. Pt states she is tracking her protein, stating she is getting right at 60 grams per day. Pt states she is not drinking with her meals or snacks. Pt states she is starting to recognize signs of satisfaction when eating, stating she is taking her time with meals and snacks.  Estimated daily fluid intake: 64 oz Supplements: daily multivitamin Current average weekly physical activity: Gym (treadmill, elliptical, stair climber, weights), 3 days a week, 45-60 minutes  24-Hr Dietary Recall First Meal: coffee skip (3 times per week) or eggs and bacon Snack: not lately Second Meal: skip or sandwich (prepared from home with whole grains), chips, fruit Snack: not lately Third Meal: meat, vegetable, potatoes or rice Snack: rarely Beverages: water, un-sweet tea  Estimated Energy Needs Calories: 1500  NUTRITION DIAGNOSIS  Overweight/obesity ([place]-3.3) related to past poor dietary habits and physical inactivity as evidenced by patient w/ planned RYGB surgery following dietary guidelines for continued weight loss.  NUTRITION INTERVENTION   Nutrition counseling (C-1) and education (E-2) to facilitate bariatric surgery goals.  Encouraged patient to honor their body's internal hunger and fullness cues.  Throughout the day, check in mentally and rate hunger. Stop eating when satisfied not full regardless of how much food is left on the plate.  Get more if still hungry 20-30 minutes later.  The key is to honor satisfaction so throughout the meal, rate fullness factor and stop when comfortably satisfied not physically full. The key is to honor hunger and fullness without any feelings of guilt or shame.  Pay attention to what the internal cues are, rather than any external factors. This will enhance the confidence you have in listening to your own body and following those internal cues enabling you to increase how often you eat when you are hungry not out of appetite and stop when you are satisfied not full.  Encouraged pt to continue to drink a minium 64 fluid ounces with half being plain water to satisfy proper hydration. Encouraged pt to continue to eat balanced meals inclusive of non starchy vegetables 2 times a day 7 days a week Encouraged pt to choose lean protein sources: limiting beef, pork, sausage, hotdogs, and lunch meat Encouraged pt to continue to drink a minium 64 fluid ounces with half being plain water to satisfy proper hydration   After bariatric surgery, you should not drink liquids with your meal or for 30 minutes before or after your meal. This is because drinking with a meal can cause you to feel full too quickly, which can lead to discomfort, nausea, or vomiting. Practicing now, will help get used to not drinking with meals and help to be more  successful after surgery.  Pre-Op Goals Progress & New Goals Continue: Aim for 64 oz of hydrating fluids per day; no sugary beverages, and no sodas. Continue: Avoid alcohol Continue: Increase physical activity; aim for 30 minutes, 5 days a week; aim for 150 minutes per  week. Continue: eat throughout the day; avoid skipping meals. New: Take a lunch box with yogurt and fruit. Continue: aim for feeling of satisfaction before fullness. Take 20-30 minutes to eat. Continue: Track protein; aim for 60 grams per day; have a protein with every meal and snack; avoid skipping meals Continue: practice not drinking with meals Appointment ended abruptly; continue with current goals.  Handouts Provided Include    Learning Style & Readiness for Change Teaching method utilized: Visual & Auditory  Demonstrated degree of understanding via: Teach Back  Readiness Level: contemplative Barriers to learning/adherence to lifestyle change: busy schedule; working two jobs  RD's Notes for next Visit  Patient progress toward chosen goals.   MONITORING & EVALUATION Dietary intake, weekly physical activity, body weight, and pre-op goals in 1 month.   Next Steps  Patient is to return to [facility] in 2 weeks for next SWL visit.

## 2023-05-15 ENCOUNTER — Telehealth: Payer: Self-pay | Admitting: Adult Health

## 2023-05-15 DIAGNOSIS — Z803 Family history of malignant neoplasm of breast: Secondary | ICD-10-CM

## 2023-05-15 NOTE — Telephone Encounter (Signed)
 Pt aware that I received [facility] mammogram report. And US , was normal. Has [facility] risk of breast cancer at 38.1% and on [facility] risk of 27.1%, will refer to Dentist.

## 2023-05-17 ENCOUNTER — Telehealth: Payer: Self-pay | Admitting: Genetic Counselor

## 2023-05-17 NOTE — Telephone Encounter (Signed)
Scheduled appointments per 2/12 scheduling message. Patient is aware of the appointments made.

## 2023-05-23 ENCOUNTER — Ambulatory Visit: Payer: 59 | Admitting: Dietician

## 2023-05-29 ENCOUNTER — Ambulatory Visit: Payer: 59 | Admitting: Orthopedic Surgery

## 2023-05-29 ENCOUNTER — Other Ambulatory Visit (INDEPENDENT_AMBULATORY_CARE_PROVIDER_SITE_OTHER): Payer: Self-pay

## 2023-05-29 ENCOUNTER — Encounter: Payer: Self-pay | Admitting: Orthopedic Surgery

## 2023-05-29 VITALS — BP 80/55 | HR 56 | Ht 63.5 in | Wt 245.0 lb

## 2023-05-29 DIAGNOSIS — M25562 Pain in left knee: Secondary | ICD-10-CM | POA: Diagnosis not present

## 2023-05-29 DIAGNOSIS — S82142S Displaced bicondylar fracture of left tibia, sequela: Secondary | ICD-10-CM | POA: Diagnosis not present

## 2023-05-29 DIAGNOSIS — S83262A Peripheral tear of lateral meniscus, current injury, left knee, initial encounter: Secondary | ICD-10-CM

## 2023-05-29 NOTE — Patient Instructions (Signed)
 While we are working on your approval for MRI please go ahead and call to schedule your appointment with [facility] within at least one (1) week.   Central Scheduling [phone]   Physical therapy has been ordered for you at [facility]. They should call you to schedule, [phone] is the phone number to call, if you want to call to schedule.

## 2023-05-29 NOTE — Progress Notes (Signed)
 Office Visit Note   Patient: [name]           Date of Birth: Apr 05, 1985           MRN: [id number] Visit Date: 05/29/2023 Requested by: Royann Shivers, PA-C [address] [place],  [place] [address] PCP: Royann Shivers, PA-C   Assessment & Plan:   Encounter Diagnoses  Name Primary?   Acute pain of left knee Yes   Peripheral tear of lateral meniscus of left knee as current injury, initial encounter    Tibial plateau fracture, left, sequela     No orders of the defined types were placed in this encounter.   38 year old female with history of ORIF left tibial plateau did well for several years surgery was in 2017.  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  Symptoms unrelieved by ibuprofen              ROS: Numbness no tingling no   Images personally read and my interpretation :  No results found.   Visit Diagnoses:  1. Acute pain of left knee   2. Peripheral tear of lateral meniscus of left knee as current injury, initial encounter   3. Tibial plateau fracture, left, sequela      Follow-Up Instructions: No follow-ups on file.    Objective: Vital Signs: BP (!) 80/55   Pulse (!) 56   Ht 5' 3.5" (1.613 m)   Wt 245 lb (111.1 kg)   LMP 05/03/2023   BMI 42.72 kg/m   Physical Exam Vitals and nursing note reviewed.  Constitutional:      Appearance: Normal appearance.  HENT:      Head: Normocephalic and atraumatic.  Eyes:     General: No scleral icterus.       Right eye: No discharge.        Left eye: No discharge.     Extraocular Movements: Extraocular movements intact.     Conjunctiva/sclera: Conjunctivae normal.     Pupils: Pupils are equal, round, and reactive to light.  Cardiovascular:     Rate and Rhythm: Normal rate.     Pulses: Normal pulses.  Musculoskeletal:     Left knee: Effusion present.     Instability Tests: Lateral McMurray test positive.  Skin:    General: Skin is warm and dry.     Capillary Refill: Capillary refill takes less than 2 seconds.  Neurological:     General: No focal deficit present.     Mental Status: She is alert and oriented to person, place, and time.     Gait: Gait normal.  Psychiatric:        Mood and Affect: Mood normal.        Behavior: Behavior normal.        Thought Content: Thought content normal.        Judgment: Judgment normal.      Left Knee  Exam   Muscle Strength  The patient has normal left knee strength.  Tenderness  The patient is experiencing tenderness in the lateral joint line.  Range of Motion  The patient has normal left knee ROM.  Tests  McMurray:   Lateral - positive Varus: negative Valgus: negative Drawer:  Anterior - negative     Posterior - negative  Other  Erythema: absent Scars: absent Sensation: normal Pulse: present Swelling: none Effusion: effusion present  Comments:  Curvilinear incision over the lateral tibial plateau  Normal incision, no erythema, no swelling, no purulence, mild tenderness in this area but more tenderness at the joint line      Specialty Comments:  No specialty comments available.  Imaging: No results found.   PMFS History: Patient Active Problem List   Diagnosis Date Noted   Mass of upper outer quadrant of right breast 05/04/2023   Breast discharge 05/04/2023   Mass of upper outer quadrant of left breast 01/12/2023   Family history of  breast cancer in sister 01/12/2023   IIH (idiopathic intracranial hypertension) 11/08/2021   Irregular bleeding 02/12/2021   Encounter for sterilization 02/12/2021   Breast pain, left 05/08/2020   Discharge from left nipple 05/08/2020   Enlarged thyroid gland 01/04/2019   Feeling tired 11/14/2016   Tachycardia 07/01/2015   Tibial plateau fracture    Abnormal uterine bleeding (AUB) 01/19/2015   Nexplanon removal 01/19/2015   Breakthrough bleeding on Nexplanon 06/13/2013   Nexplanon insertion 11/29/2012   OBESITY 10/31/2007   DEPRESSION 10/31/2007   ALLERGIC RHINITIS, SEASONAL 10/31/2007   FIBROCYSTIC BREAST DISEASE 10/31/2007   Past Medical History:  Diagnosis Date   Abnormal uterine bleeding (AUB) 01/19/2015   Chest pain    Complication of anesthesia    difficult to wake up after anesthesia   Depression    Dysrhythmia    Family history of adverse reaction to anesthesia    Fibrocystic breast changes    Hypertension    Hyperthyroidism    IIH (idiopathic intracranial hypertension)    Migraines    Nexplanon in place 01/19/2015   Nexplanon insertion 11/29/2012   inserted nexplanon 11/29/12 in left arm, remove 11/30/15   Obesity    Preterm delivery    Seasonal rhinitis    Tachycardia    UTI (lower urinary tract infection)     Family History  Problem Relation Age of Onset   Heart disease Paternal Grandfather    Diabetes Paternal Grandfather    Hypertension Paternal Grandfather    Diabetes Paternal Grandmother    Hypertension Paternal Grandmother    Hypertension Maternal Grandmother    Hypertension Maternal Grandfather    Hypertension Father    Hypertension Mother    Hodgkin's lymphoma Mother    Breast cancer Sister     Past Surgical History:  Procedure Laterality Date   CHOLECYSTECTOMY     DILITATION & CURRETTAGE/HYSTROSCOPY WITH HYDROTHERMAL ABLATION N/A 01/04/2022   Procedure: DILATATION & CURETTAGE/HYSTEROSCOPY WITH HYDROTHERMAL ABLATION;  Surgeon: Myna Hidalgo,  DO;  Location: [facility];  Service: Gynecology;  Laterality: N/A;   LAPAROSCOPIC BILATERAL SALPINGECTOMY Bilateral 01/04/2022   Procedure: LAPAROSCOPIC BILATERAL SALPINGECTOMY;  Surgeon: Myna Hidalgo, DO;  Location: [facility];  Service: Gynecology;  Laterality: Bilateral;   ORIF TIBIA PLATEAU Left 07/01/2015   Procedure: OPEN REDUCTION INTERNAL FIXATION (ORIF) TIBIAL PLATEAU AND BONE GRAFT LEFT TIBIAL PLATEAU;  Surgeon: [name], MD;  Location: [facility];  Service: Orthopedics;  Laterality: Left;   REMOVAL OF NON VAGINAL CONTRACEPTIVE DEVICE Left  01/04/2022   Procedure: REMOVAL OF NEXPLANON IMPLANT;  Surgeon: Myna Hidalgo, DO;  Location: [facility];  Service: Gynecology;  Laterality: Left;   TONSILLECTOMY     Social History   Occupational History   Not on file  Tobacco Use   Smoking status: Never   Smokeless tobacco: Never  Vaping Use   Vaping status: Never Used  Substance and Sexual Activity   Alcohol use: No   Drug use: No   Sexual activity: Yes    Birth control/protection: Other-see comments    Comment: tubal & ablation

## 2023-05-29 NOTE — Progress Notes (Signed)
  Intake history:  BP (!) 80/55   Pulse (!) 56   Ht 5' 3.5" (1.613 m)   Wt 245 lb (111.1 kg)   LMP 05/03/2023   BMI 42.72 kg/m  Body mass index is 42.72 kg/m.    WHAT ARE WE SEEING YOU FOR TODAY?   left knee(s)  How long has this bothered you? (DOI?DOS?WS?)  on 3 weeks ago, pain started, no injury history of tibial plateau fx ORIF 2017  Anticoag.  No  Diabetes No  Heart disease No  Hypertension No  SMOKING HX No  Kidney disease No  Any ALLERGIES ______________________________________________   Treatment:  Have you taken:  Tylenol No  Advil Yes  Had PT No  Had injection No  Other  _________________________

## 2023-05-30 ENCOUNTER — Ambulatory Visit: Payer: 59 | Admitting: Dietician

## 2023-06-01 ENCOUNTER — Encounter: Payer: 59 | Attending: General Surgery | Admitting: Dietician

## 2023-06-01 ENCOUNTER — Encounter: Payer: Self-pay | Admitting: Dietician

## 2023-06-01 VITALS — Ht 63.0 in | Wt 263.4 lb

## 2023-06-01 DIAGNOSIS — E669 Obesity, unspecified: Secondary | ICD-10-CM | POA: Diagnosis present

## 2023-06-01 NOTE — Progress Notes (Signed)
 Supervised Weight Loss Visit Bariatric Nutrition Education  Planned surgery: RYGB Pt expectation of surgery: to be more active and confident  7 out of 10 SWL Appointments  NUTRITION ASSESSMENT   Anthropometrics  Start weight at [facility]: 249 lbs (date: 02/14/2023)  Height: 63 in Weight today: 263.4 lbs (with work utility belt on) BMI: 46.66 kg/m2     Clinical   Pharmacotherapy: History of weight loss medication used: took something once, stating she had rapid heart rate and stopped.  Medical hx: obesity Medications: multivitamin, buspirone Labs: see EMR (WNL March 2024) Notable signs/symptoms: none noted Any previous deficiencies? No  Lifestyle & Dietary Hx  Pt states her insurance will be changing June 1st, stating she is anxious to get this process done so she can have surgery before her insurance changes. Pt states she has a 64 oz cup/jug, stating she drinks one every day. Pt states she increased her physical activity to 5 days per week. Pt states she struggles with breakfast, stating she is not hungry in the morning. Pt agreeable to meal planning and prepping, stating she already meal preps for her husband for the week on Sundays.  Estimated daily fluid intake: 64 oz Supplements: daily multivitamin Current average weekly physical activity: Gym (treadmill, elliptical, stair climber, weights), 5 days a week, 60 minutes (unless she needs to cut it short (30 minutes)  24-Hr Dietary Recall First Meal: coffee skip or protein shake Snack: not lately Second Meal: fruit Snack: not lately Third Meal: meat, vegetable or salad Snack: rarely Beverages: water, un-sweet tea  Estimated Energy Needs Calories: 1500  NUTRITION DIAGNOSIS  Overweight/obesity ([place]-3.3) related to past poor dietary habits and physical inactivity as evidenced by patient w/ planned RYGB surgery following dietary guidelines for continued weight loss.  NUTRITION INTERVENTION  Nutrition counseling (C-1) and  education (E-2) to facilitate bariatric surgery goals.  Encouraged patient to honor their body's internal hunger and fullness cues.  Throughout the day, check in mentally and rate hunger. Stop eating when satisfied not full regardless of how much food is left on the plate.  Get more if still hungry 20-30 minutes later.  The key is to honor satisfaction so throughout the meal, rate fullness factor and stop when comfortably satisfied not physically full. The key is to honor hunger and fullness without any feelings of guilt or shame.  Pay attention to what the internal cues are, rather than any external factors. This will enhance the confidence you have in listening to your own body and following those internal cues enabling you to increase how often you eat when you are hungry not out of appetite and stop when you are satisfied not full.  Encouraged pt to continue to drink a minium 64 fluid ounces with half being plain water to satisfy proper hydration. Encouraged pt to continue to eat balanced meals inclusive of non starchy vegetables 2 times a day 7 days a week Encouraged pt to choose lean protein sources: limiting beef, pork, sausage, hotdogs, and lunch meat Encouraged pt to continue to drink a minium 64 fluid ounces with half being plain water to satisfy proper hydration   After bariatric surgery, you should not drink liquids with your meal or for 30 minutes before or after your meal. This is because drinking with a meal can cause you to feel full too quickly, which can lead to discomfort, nausea, or vomiting. Practicing now, will help get used to not drinking with meals and help to be more successful after surgery. Encouraged  patient to meal plan and prep. Planning and prepping help you to be intentional about your nutrition intake.Planning means you eat more regularly. Meal planning and prepping can also help you to keep to a more regular meal schedule. Regular small meals are particularly important when  you don't feel hungry as often.   Pre-Op Goals Progress & New Goals Continue: Aim for 64 oz of hydrating fluids per day; no sugary beverages, and no sodas. Continue: Avoid alcohol Continue: Increase physical activity; aim for 30 minutes, 5 days a week; aim for 150 minutes per week. Continue: eat throughout the day; avoid skipping meals. New: Take a lunch box with yogurt and fruit. Continue: aim for feeling of satisfaction before fullness. Take 20-30 minutes to eat. Re-engage: Track protein; aim for 60 grams per day; have a protein with every meal and snack; avoid skipping meals Continue: practice not drinking with meals New: increase non-starchy vegetables; aim for 2 or more servings per day New: meal planning and prep; avoid skipping breakfast  Handouts Provided Include  Bariatric MyPlate  Learning Style & Readiness for Change Teaching method utilized: Visual & Auditory  Demonstrated degree of understanding via: Teach Back  Readiness Level: contemplative Barriers to learning/adherence to lifestyle change: busy schedule; working two jobs  RD's Notes for next Visit  Patient progress toward chosen goals.   MONITORING & EVALUATION Dietary intake, weekly physical activity, body weight, and pre-op goals in 1 month.   Next Steps  Patient is to return to [facility] in 2 weeks for next SWL visit.

## 2023-06-02 ENCOUNTER — Ambulatory Visit: Payer: 59 | Admitting: Orthopedic Surgery

## 2023-06-05 ENCOUNTER — Ambulatory Visit (HOSPITAL_COMMUNITY): Admission: RE | Admit: 2023-06-05 | Payer: 59 | Source: Ambulatory Visit

## 2023-06-13 ENCOUNTER — Encounter: Payer: 59 | Attending: General Surgery | Admitting: Dietician

## 2023-06-13 ENCOUNTER — Encounter: Payer: Self-pay | Admitting: Dietician

## 2023-06-13 DIAGNOSIS — E669 Obesity, unspecified: Secondary | ICD-10-CM | POA: Diagnosis present

## 2023-06-13 NOTE — Progress Notes (Signed)
 Supervised Weight Loss Visit Bariatric Nutrition Education  I connected with Fabio Asa by a video enabled visit and verified that I am speaking with the correct person using two identifiers.  Planned surgery: RYGB Pt expectation of surgery: to be more active and confident  8 out of 10 SWL Appointments  NUTRITION ASSESSMENT   Anthropometrics  Start weight at [facility]: 249 lbs (date: 02/14/2023)  Height: 63 in Weight today: virtual visit BMI: 46.66 kg/m2     Clinical   Pharmacotherapy: History of weight loss medication used: took something once, stating she had rapid heart rate and stopped.  Medical hx: obesity Medications: multivitamin, buspirone Labs: see EMR (WNL March 2024) Notable signs/symptoms: none noted Any previous deficiencies? No  Lifestyle & Dietary Hx  Pt signed on to virtual visit from work today. 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 Pt states she has a gallon container for water, stating she get a gallon in a day. Pt states she is meal prepping well, stating she meal preps for her husband as well.  Estimated daily fluid intake: 64 oz Supplements: daily multivitamin Current average weekly physical activity: Gym (treadmill, elliptical, stair climber, weights), 5 days a week, 60 minutes (unless she needs to cut it short (30 minutes)  24-Hr Dietary Recall First Meal: coffee greek yogurt and fruit Snack: celery with peanut butter Second Meal: salad with chicken Snack:  Third Meal: meat, vegetable or salad Snack: sometimes some fruit or celery stick or boiled egg Beverages: water, un-sweet tea  Estimated Energy Needs Calories: 1500  NUTRITION DIAGNOSIS  Overweight/obesity ([place]-3.3) related to past poor dietary habits and physical inactivity as evidenced by patient w/ planned RYGB surgery following dietary guidelines for continued weight loss.  NUTRITION INTERVENTION  Nutrition counseling (C-1) and education (E-2) to  facilitate bariatric surgery goals.  Encouraged patient to honor their body's internal hunger and fullness cues.  Throughout the day, check in mentally and rate hunger. Stop eating when satisfied not full regardless of how much food is left on the plate.  Get more if still hungry 20-30 minutes later.  The key is to honor satisfaction so throughout the meal, rate fullness factor and stop when comfortably satisfied not physically full. The key is to honor hunger and fullness without any feelings of guilt or shame.  Pay attention to what the internal cues are, rather than any external factors. This will enhance the confidence you have in listening to your own body and following those internal cues enabling you to increase how often you eat when you are hungry not out of appetite and stop when you are satisfied not full.  Encouraged pt to continue to drink a minium 64 fluid ounces with half being plain water to satisfy proper hydration. Encouraged pt to continue to eat balanced meals inclusive of non starchy vegetables 2 times a day 7 days a week Encouraged pt to choose lean protein sources: limiting beef, pork, sausage, hotdogs, and lunch meat Encouraged pt to continue to drink a minium 64 fluid ounces with half being plain water to satisfy proper hydration   After bariatric surgery, you should not drink liquids with your meal or for 30 minutes before or after your meal. This is because drinking with a meal can cause you to feel full too quickly, which can lead to discomfort, nausea, or vomiting. Practicing now, will help get used to not drinking with meals and help to be more successful after surgery. Encouraged patient  to meal plan and prep. Planning and prepping help you to be intentional about your nutrition intake.Planning means you eat more regularly. Meal planning and prepping can also help you to keep to a more regular meal schedule. Regular small meals are particularly important when you don't feel  hungry as often.   Pre-Op Goals Progress & New Goals Continue: Aim for 64 oz of hydrating fluids per day; no sugary beverages, and no sodas. Continue: Avoid alcohol Continue: Increase physical activity; aim for 30 minutes, 5 days a week; aim for 150 minutes per week. Continue: eat throughout the day; avoid skipping meals. New: Take a lunch box with yogurt and fruit. Continue: aim for feeling of satisfaction before fullness. Take 20-30 minutes to eat. Continue: Track protein; aim for 60 grams per day; have a protein with every meal and snack; avoid skipping meals Continue: practice not drinking with meals Continue: increase non-starchy vegetables; aim for 2 or more servings per day Continue: meal planning and prep; avoid skipping breakfast New: identify/purchase an unflavored protein powder (Genepro, Isopure); experiment with using unflavored protein powder with other liquids.  Handouts Provided Include    Learning Style & Readiness for Change Teaching method utilized: Visual & Auditory  Demonstrated degree of understanding via: Teach Back  Readiness Level: contemplative Barriers to learning/adherence to lifestyle change: busy schedule; working two jobs  RD's Notes for next Visit  Patient progress toward chosen goals.   MONITORING & EVALUATION Dietary intake, weekly physical activity, body weight, and pre-op goals in 1 month.   Next Steps  Patient is to return to [facility] in 2 weeks for next SWL visit.

## 2023-06-20 ENCOUNTER — Ambulatory Visit (HOSPITAL_COMMUNITY): Payer: 59 | Attending: Physical Therapy | Admitting: Physical Therapy

## 2023-06-20 NOTE — Therapy (Deleted)
 OUTPATIENT PHYSICAL THERAPY LOWER EXTREMITY EVALUATION   Patient Name: [name] MRN: [id number] DOB:February 13, 1986, 38 y.o., female Today's Date: 06/20/2023  END OF SESSION:   Past Medical History:  Diagnosis Date   Abnormal uterine bleeding (AUB) 01/19/2015   Chest pain    Complication of anesthesia    difficult to wake up after anesthesia   Depression    Dysrhythmia    Family history of adverse reaction to anesthesia    Fibrocystic breast changes    Hypertension    Hyperthyroidism    IIH (idiopathic intracranial hypertension)    Migraines    Nexplanon in place 01/19/2015   Nexplanon insertion 11/29/2012   inserted nexplanon 11/29/12 in left arm, remove 11/30/15   Obesity    Preterm delivery    Seasonal rhinitis    Tachycardia    UTI (lower urinary tract infection)    Past Surgical History:  Procedure Laterality Date   CHOLECYSTECTOMY     DILITATION & CURRETTAGE/HYSTROSCOPY WITH HYDROTHERMAL ABLATION N/A 01/04/2022   Procedure: DILATATION & CURETTAGE/HYSTEROSCOPY WITH HYDROTHERMAL ABLATION;  Surgeon: Myna Hidalgo, DO;  Location: [facility];  Service: Gynecology;  Laterality: N/A;   LAPAROSCOPIC BILATERAL SALPINGECTOMY Bilateral 01/04/2022   Procedure: LAPAROSCOPIC BILATERAL SALPINGECTOMY;  Surgeon: Myna Hidalgo, DO;  Location: [facility];  Service: Gynecology;  Laterality: Bilateral;   ORIF TIBIA PLATEAU Left 07/01/2015   Procedure: OPEN REDUCTION INTERNAL FIXATION (ORIF) TIBIAL PLATEAU AND BONE GRAFT LEFT TIBIAL PLATEAU;  Surgeon: [name], MD;  Location: [facility];  Service: Orthopedics;  Laterality: Left;   REMOVAL OF NON VAGINAL CONTRACEPTIVE DEVICE Left 01/04/2022   Procedure: REMOVAL OF NEXPLANON IMPLANT;  Surgeon: Myna Hidalgo, DO;  Location: [facility];  Service: Gynecology;  Laterality: Left;   TONSILLECTOMY     Patient Active Problem List   Diagnosis Date Noted   Mass of upper outer quadrant of right breast 05/04/2023   Breast discharge 05/04/2023   Mass of  upper outer quadrant of left breast 01/12/2023   Family history of breast cancer in sister 01/12/2023   IIH (idiopathic intracranial hypertension) 11/08/2021   Irregular bleeding 02/12/2021   Encounter for sterilization 02/12/2021   Breast pain, left 05/08/2020   Discharge from left nipple 05/08/2020   Enlarged thyroid gland 01/04/2019   Feeling tired 11/14/2016   Tachycardia 07/01/2015   Tibial plateau fracture    Abnormal uterine bleeding (AUB) 01/19/2015   Nexplanon removal 01/19/2015   Breakthrough bleeding on Nexplanon 06/13/2013   Nexplanon insertion 11/29/2012   OBESITY 10/31/2007   DEPRESSION 10/31/2007   ALLERGIC RHINITIS, SEASONAL 10/31/2007   FIBROCYSTIC BREAST DISEASE 10/31/2007    PCP: Wayland Denis  REFERRING PROVIDER: [name], MD  REFERRING DIAG:  Diagnosis  M25.562 (ICD-10-CM) - Acute pain of left knee    THERAPY DIAG:  Muscle weakness  Rationale for Evaluation and Treatment: Rehabilitation  ONSET DATE: ***  SUBJECTIVE:   SUBJECTIVE STATEMENT: ***  PERTINENT HISTORY: 38 year old female with history of ORIF left tibial plateau did well for several years surgery was in 2017.  Presents with a 3-week history of crunching and pain lateral side left knee   Differential diagnosis of chondromalacia patella, alteration of the plate plate, lateral meniscus tear   PAIN:  Are you having pain? Yes: NPRS scale: *** Pain location: *** Pain description: *** Aggravating factors: *** Relieving factors: ***  PRECAUTIONS: None  RED FLAGS: None   WEIGHT BEARING RESTRICTIONS: No  FALLS:  Has patient fallen in last 6 months? No  LIVING  ENVIRONMENT: Lives with: lives with their family Lives in: {Lives in:25570} Stairs: {opstairs:27293} Has following equipment at home: {Assistive devices:23999}  OCCUPATION: ***  PLOF: Independent  PATIENT GOALS: less pain   NEXT MD VISIT: ***  OBJECTIVE:  Note: Objective measures were completed at  Evaluation unless otherwise noted.  DIAGNOSTIC FINDINGS: Impression   Normal articulation left knee   Hardware left knee no complications  PATIENT SURVEYS:  LEFS ***  COGNITION: Overall cognitive status: Within functional limits for tasks assessed     SENSATION: WFL   MUSCLE LENGTH: Hamstrings: Right *** deg; Left *** deg Maisie Fus test: Right *** deg; Left *** deg  POSTURE: {posture:25561}  PALPATION: ***  LOWER EXTREMITY ROM:  Active ROM Right eval Left eval  Hip flexion    Hip extension    Hip abduction    Hip adduction    Hip internal rotation    Hip external rotation    Knee flexion    Knee extension    Ankle dorsiflexion    Ankle plantarflexion    Ankle inversion    Ankle eversion     (Blank rows = not tested)  LOWER EXTREMITY MMT:  MMT Right eval Left eval  Hip flexion    Hip extension    Hip abduction    Hip adduction    Hip internal rotation    Hip external rotation    Knee flexion    Knee extension    Ankle dorsiflexion    Ankle plantarflexion    Ankle inversion    Ankle eversion     (Blank rows = not tested)   FUNCTIONAL TESTS:  30 seconds chair stand test 2 minute walk test: *** Rt SLS:    ;Lt SLS                                                                                                                                  TREATMENT DATE: ***    PATIENT EDUCATION:  Education details: *** Person educated: {Person educated:25204} Education method: {Education Method:25205} Education comprehension: {Education Comprehension:25206}  HOME EXERCISE PROGRAM: ***  ASSESSMENT:  CLINICAL IMPRESSION: Patient is a 38 y.o. female who was seen today for physical therapy evaluation and treatment for Lt knee pain.   OBJECTIVE IMPAIRMENTS: decreased activity tolerance, decreased balance, difficulty walking, decreased ROM, and decreased strength.   ACTIVITY LIMITATIONS: carrying, lifting, bending, and locomotion level  PARTICIPATION  LIMITATIONS: shopping, community activity, and occupation   REHAB POTENTIAL: Good  CLINICAL DECISION MAKING: Stable/uncomplicated  EVALUATION COMPLEXITY: Low   GOALS: Goals reviewed with patient? {yes/no:20286}  SHORT TERM GOALS: Target date: *** *** Baseline: Goal status: INITIAL  2.  *** Baseline:  Goal status: INITIAL  3.  *** Baseline:  Goal status: INITIAL  4.  *** Baseline:  Goal status: INITIAL  5.  *** Baseline:  Goal status: INITIAL  6.  *** Baseline:  Goal status: INITIAL  LONG TERM GOALS: Target date: ***  ***  Baseline:  Goal status: INITIAL  2.  *** Baseline:  Goal status: INITIAL  3.  *** Baseline:  Goal status: INITIAL  4.  *** Baseline:  Goal status: INITIAL  5.  *** Baseline:  Goal status: INITIAL  6.  *** Baseline:  Goal status: INITIAL   PLAN:  PT FREQUENCY: 2x/week  PT DURATION: 6 weeks  PLANNED INTERVENTIONS: 97110-Therapeutic exercises, 97530- Therapeutic activity, [id number]- Neuromuscular re-education, 97535- Self Care, and 78469- Manual therapy  PLAN FOR NEXT SESSION: Progress exercises as indicated.   Virgina Organ, PT CLT [phone]  06/20/2023, 8:19 AM

## 2023-06-27 ENCOUNTER — Encounter (HOSPITAL_COMMUNITY): Payer: 59

## 2023-06-27 ENCOUNTER — Other Ambulatory Visit (HOSPITAL_COMMUNITY): Payer: 59

## 2023-06-27 ENCOUNTER — Encounter: Payer: 59 | Attending: General Surgery | Admitting: Dietician

## 2023-06-27 ENCOUNTER — Encounter: Payer: Self-pay | Admitting: Dietician

## 2023-06-27 DIAGNOSIS — E669 Obesity, unspecified: Secondary | ICD-10-CM | POA: Diagnosis present

## 2023-06-27 NOTE — Progress Notes (Signed)
 Supervised Weight Loss Visit Bariatric Nutrition Education  I connected with [name] by a video enabled visit and verified that I am speaking with the correct person using two identifiers.  Planned surgery: RYGB Pt expectation of surgery: to be more active and confident  9 out of 10 SWL Appointments  NUTRITION ASSESSMENT   Anthropometrics  Start weight at [facility]: 249 lbs (date: 02/14/2023)  Height: 63 in Weight today: virtual visit BMI: 46.66 kg/m2     Clinical   Pharmacotherapy: History of weight loss medication used: took something once, stating she had rapid heart rate and stopped.  Medical hx: obesity Medications: multivitamin, buspirone Labs: see EMR (WNL March 2024) Notable signs/symptoms: none noted Any previous deficiencies? No  Lifestyle & Dietary Hx  Pt states she continues to go to the gym 5 days per week, stating that is going well. Pt states she researched un-flavored protein powders on [employer], stating she has a couple of them in her cart to review for purchase. Pt states she also like the coffee and strawberry flavor of the Primier protein shakes. Pt states she is still tracking protein, stating she gets 60 grams per day. Pt states some days it is hard, but states she will get 60 grams. Pt states she does not keep fluids at the table while eating.  Estimated daily fluid intake: 64 oz Supplements: daily multivitamin Current average weekly physical activity: Gym (treadmill, elliptical, stair climber, weights), 5 days a week, 30-45 minutes  24-Hr Dietary Recall First Meal: coffee greek yogurt (15 grams of protein) and fruit (banana or berries) or boiled egg. Snack: celery with peanut butter Second Meal: salad with chicken or grilled salmon Snack:  Third Meal: meat, vegetable or salad Snack: sometimes some fruit or celery stick or boiled egg Beverages: water, un-sweet tea  Estimated Energy Needs Calories: 1500  NUTRITION DIAGNOSIS   Overweight/obesity ([place]-3.3) related to past poor dietary habits and physical inactivity as evidenced by patient w/ planned RYGB surgery following dietary guidelines for continued weight loss.  NUTRITION INTERVENTION  Nutrition counseling (C-1) and education (E-2) to facilitate bariatric surgery goals.  Encouraged patient to honor their body's internal hunger and fullness cues.  Throughout the day, check in mentally and rate hunger. Stop eating when satisfied not full regardless of how much food is left on the plate.  Get more if still hungry 20-30 minutes later.  The key is to honor satisfaction so throughout the meal, rate fullness factor and stop when comfortably satisfied not physically full. The key is to honor hunger and fullness without any feelings of guilt or shame.  Pay attention to what the internal cues are, rather than any external factors. This will enhance the confidence you have in listening to your own body and following those internal cues enabling you to increase how often you eat when you are hungry not out of appetite and stop when you are satisfied not full.  Encouraged pt to continue to drink a minium 64 fluid ounces with half being plain water to satisfy proper hydration. Encouraged pt to continue to eat balanced meals inclusive of non starchy vegetables 2 times a day 7 days a week Encouraged pt to choose lean protein sources: limiting beef, pork, sausage, hotdogs, and lunch meat Encouraged pt to continue to drink a minium 64 fluid ounces with half being plain water to satisfy proper hydration   After bariatric surgery, you should not drink liquids with your meal or for 30 minutes before or after your  meal. This is because drinking with a meal can cause you to feel full too quickly, which can lead to discomfort, nausea, or vomiting. Practicing now, will help get used to not drinking with meals and help to be more successful after surgery. Encouraged patient to meal plan and prep.  Planning and prepping help you to be intentional about your nutrition intake.Planning means you eat more regularly. Meal planning and prepping can also help you to keep to a more regular meal schedule. Regular small meals are particularly important when you don't feel hungry as often.  Pre-Op Goals Progress & New Goals Continue: Aim for 64 oz of hydrating fluids per day; no sugary beverages, and no sodas. Continue: Avoid alcohol Continue: Increase physical activity; aim for 30 minutes, 5 days a week; aim for 150 minutes per week. Continue: eat throughout the day; avoid skipping meals. New: Take a lunch box with yogurt and fruit. Continue: aim for feeling of satisfaction before fullness. Take 20-30 minutes to eat. Continue: Track protein; aim for 60 grams per day; have a protein with every meal and snack; avoid skipping meals Continue: practice not drinking with meals Continue: increase non-starchy vegetables; aim for 2 or more servings per day Continue: meal planning and prep; avoid skipping breakfast Re-engage: identify/purchase an unflavored protein powder (Genepro, Isopure); experiment with using unflavored protein powder with other liquids.  Handouts Provided Include    Learning Style & Readiness for Change Teaching method utilized: Visual & Auditory  Demonstrated degree of understanding via: Teach Back  Readiness Level: contemplative Barriers to learning/adherence to lifestyle change: busy schedule; working two jobs  RD's Notes for next Visit  Patient progress toward chosen goals.   MONITORING & EVALUATION Dietary intake, weekly physical activity, body weight, and pre-op goals in 1 month.   Next Steps  Patient is to return to [facility] in 2 weeks for next SWL visit.

## 2023-07-11 ENCOUNTER — Encounter: Payer: Self-pay | Admitting: Dietician

## 2023-07-11 ENCOUNTER — Encounter: Payer: 59 | Attending: General Surgery | Admitting: Dietician

## 2023-07-11 VITALS — Ht 63.0 in | Wt 250.7 lb

## 2023-07-11 DIAGNOSIS — E669 Obesity, unspecified: Secondary | ICD-10-CM | POA: Insufficient documentation

## 2023-07-11 NOTE — Progress Notes (Signed)
 Supervised Weight Loss Visit Bariatric Nutrition Education  Planned surgery: RYGB Pt expectation of surgery: to be more active and confident  10 out of 10 SWL Appointments  Pt completed visits.   Pt has cleared nutrition requirements.   NUTRITION ASSESSMENT   Anthropometrics  Start weight at [facility]: 249 lbs (date: 02/14/2023)  Height: 63 in Weight today: 250.7 lbs BMI: 44.41 kg/m2     Clinical   Pharmacotherapy: History of weight loss medication used: took something once, stating she had rapid heart rate and stopped.  Medical hx: obesity Medications: multivitamin, buspirone Labs: see EMR (WNL March 2024) Notable signs/symptoms: none noted Any previous deficiencies? No  Lifestyle & Dietary Hx  Pt states she purchased an unflavored protein powder, stating she tried it with water. Pt states she is still avoiding alcohol. Pt states she is going to the gym 5 days a week, stating she bumped it up to about 60 minutes, and will also walk a day on the weekend. Pt states she has gotten better eating small portions and getting 60 grams of protein each day. Pt states meal prepping has helped. Pt states she has recognized signs of satisfactions. Pt states she has taken what she has learned and put it into practice, stating she doesn't know of anything that she needs to focus more on. Pt confident with changes made.  Estimated daily fluid intake: 64 oz Supplements: daily multivitamin Current average weekly physical activity: Gym (treadmill, elliptical, stair climber, weights), 5 days a week, 45-60 minutes  24-Hr Dietary Recall First Meal: coffee greek yogurt (15 grams of protein) and fruit (banana or berries) or boiled egg. Snack: celery with peanut butter Second Meal: salad with chicken or grilled salmon Snack:  Third Meal: meat, vegetable or salad Snack: sometimes some fruit or celery stick or boiled egg Beverages: water, un-sweet tea  Estimated Energy Needs Calories:  1500  NUTRITION DIAGNOSIS  Overweight/obesity ([place]-3.3) related to past poor dietary habits and physical inactivity as evidenced by patient w/ planned RYGB surgery following dietary guidelines for continued weight loss.  NUTRITION INTERVENTION  Nutrition counseling (C-1) and education (E-2) to facilitate bariatric surgery goals.  Encouraged patient to honor their body's internal hunger and fullness cues.  Throughout the day, check in mentally and rate hunger. Stop eating when satisfied not full regardless of how much food is left on the plate.  Get more if still hungry 20-30 minutes later.  The key is to honor satisfaction so throughout the meal, rate fullness factor and stop when comfortably satisfied not physically full. The key is to honor hunger and fullness without any feelings of guilt or shame.  Pay attention to what the internal cues are, rather than any external factors. This will enhance the confidence you have in listening to your own body and following those internal cues enabling you to increase how often you eat when you are hungry not out of appetite and stop when you are satisfied not full.  Encouraged pt to continue to drink a minium 64 fluid ounces with half being plain water to satisfy proper hydration. After bariatric surgery, you should not drink liquids with your meal or for 30 minutes before or after your meal. This is because drinking with a meal can cause you to feel full too quickly, which can lead to discomfort, nausea, or vomiting. Practicing now, will help get used to not drinking with meals and help to be more successful after surgery. Encouraged patient to meal plan and prep. Planning and prepping  help you to be intentional about your nutrition intake.Planning means you eat more regularly. Meal planning and prepping can also help you to keep to a more regular meal schedule. Regular small meals are particularly important when you don't feel hungry as often. Reviewed Pre-Op  Goals  Pre-Op Goals Progress & New Goals Continue: Aim for 64 oz of hydrating fluids per day; no sugary beverages, and no sodas. Continue: Avoid alcohol Continue: Increase physical activity; aim for 30 minutes, 5 days a week; aim for 150 minutes per week. Continue: eat throughout the day; avoid skipping meals. New: Take a lunch box with yogurt and fruit. Continue: aim for feeling of satisfaction before fullness. Take 20-30 minutes to eat. Continue: Track protein; aim for 60 grams per day; have a protein with every meal and snack; avoid skipping meals Continue: practice not drinking with meals Continue: increase non-starchy vegetables; aim for 2 or more servings per day Continue: meal planning and prep; avoid skipping breakfast Continue: try mixing un-flavored protein powders with hydrating fluids (broth, zero calorie flavored beverages)  Handouts Provided Include    Learning Style & Readiness for Change Teaching method utilized: Visual & Auditory  Demonstrated degree of understanding via: Teach Back  Readiness Level: contemplative Barriers to learning/adherence to lifestyle change: busy schedule; working two jobs  RD's Notes for next Visit  Patient progress toward chosen goals.   MONITORING & EVALUATION Dietary intake, weekly physical activity, body weight, and pre-op goals in 1 month.   Next Steps  Pt has completed visits. No further supervised visits required/recommended. Patient is to return to [facility] for pre-op class >2 weeks prior to scheduled surgery.

## 2023-07-14 ENCOUNTER — Inpatient Hospital Stay: Payer: 59 | Attending: Internal Medicine

## 2023-07-14 ENCOUNTER — Inpatient Hospital Stay: Payer: 59 | Admitting: Genetic Counselor

## 2023-07-19 ENCOUNTER — Other Ambulatory Visit (HOSPITAL_COMMUNITY)
Admission: RE | Admit: 2023-07-19 | Discharge: 2023-07-19 | Disposition: A | Discharge status: Home / Self Care | Source: Ambulatory Visit | Attending: Obstetrics & Gynecology | Admitting: Obstetrics & Gynecology

## 2023-07-19 ENCOUNTER — Ambulatory Visit: Admitting: *Deleted

## 2023-07-19 DIAGNOSIS — Z113 Encounter for screening for infections with a predominantly sexual mode of transmission: Secondary | ICD-10-CM

## 2023-07-19 NOTE — Progress Notes (Signed)
   NURSE VISIT- VAGINITIS/STD/POC  SUBJECTIVE:  [name] is a 38 y.o. [id number] GYN patientfemale here for a vaginal swab for STD screen.  She reports the following symptoms: none for 0 days. Denies abnormal vaginal bleeding, significant pelvic pain, fever, or UTI symptoms.  OBJECTIVE:  There were no vitals taken for this visit.  Appears well, in no apparent distress  ASSESSMENT: Vaginal swab for STD screen  PLAN: Self-collected vaginal probe for Gonorrhea, Chlamydia, Trichomonas, Bacterial Vaginosis, Yeast sent to lab Treatment: to be determined once results are received Follow-up as needed if symptoms persist/worsen, or new symptoms develop  Kerrie Peek  07/19/2023 3:48 PM

## 2023-07-20 ENCOUNTER — Encounter (HOSPITAL_COMMUNITY)

## 2023-07-20 ENCOUNTER — Other Ambulatory Visit (HOSPITAL_COMMUNITY)

## 2023-07-20 ENCOUNTER — Encounter: Payer: Self-pay | Admitting: Women's Health

## 2023-07-20 LAB — HEPATITIS B SURFACE ANTIGEN: Hepatitis B Surface Ag: NEGATIVE

## 2023-07-20 LAB — HIV ANTIBODY (ROUTINE TESTING W REFLEX): HIV Screen 4th Generation wRfx: NONREACTIVE

## 2023-07-20 LAB — RPR: RPR Ser Ql: NONREACTIVE

## 2023-07-21 LAB — CERVICOVAGINAL ANCILLARY ONLY
Bacterial Vaginitis (gardnerella): POSITIVE — AB
Candida Glabrata: NEGATIVE
Candida Vaginitis: NEGATIVE
Chlamydia: NEGATIVE
Comment: NEGATIVE
Comment: NEGATIVE
Comment: NEGATIVE
Comment: NEGATIVE
Comment: NEGATIVE
Comment: NORMAL
Neisseria Gonorrhea: NEGATIVE
Trichomonas: NEGATIVE

## 2023-07-24 ENCOUNTER — Encounter: Payer: Self-pay | Admitting: Women's Health

## 2023-07-24 ENCOUNTER — Other Ambulatory Visit: Payer: Self-pay | Admitting: Women's Health

## 2023-07-24 MED ORDER — METRONIDAZOLE 500 MG PO TABS: 500.0000 mg | ORAL_TABLET | Freq: Two times a day (BID) | ORAL | 0 refills | Status: DC

## 2023-07-27 ENCOUNTER — Encounter (HOSPITAL_COMMUNITY)

## 2023-07-27 ENCOUNTER — Inpatient Hospital Stay
Admission: RE | Admit: 2023-07-27 | Discharge: 2023-07-27 | Disposition: A | Discharge status: Home / Self Care | Payer: Self-pay | Source: Ambulatory Visit | Attending: Adult Health | Admitting: Adult Health

## 2023-07-27 ENCOUNTER — Ambulatory Visit (HOSPITAL_COMMUNITY): Attending: Adult Health

## 2023-07-27 ENCOUNTER — Other Ambulatory Visit: Payer: Self-pay | Admitting: Adult Health

## 2023-07-27 DIAGNOSIS — N6452 Nipple discharge: Secondary | ICD-10-CM

## 2023-07-27 DIAGNOSIS — N6311 Unspecified lump in the right breast, upper outer quadrant: Secondary | ICD-10-CM

## 2023-08-08 ENCOUNTER — Ambulatory Visit: Admission: EM | Admit: 2023-08-08 | Discharge: 2023-08-08 | Disposition: A | Discharge status: Home / Self Care

## 2023-08-08 DIAGNOSIS — H6993 Unspecified Eustachian tube disorder, bilateral: Secondary | ICD-10-CM

## 2023-08-08 NOTE — ED Triage Notes (Signed)
 Pt reports to [facility]  bilateral ears muffled.

## 2023-08-08 NOTE — ED Provider Notes (Signed)
 [facility]    CSN: [id number] Arrival date & time: 08/08/23  1611      History   Chief Complaint Chief Complaint  Patient presents with   Ear Fullness    HPI [name] is a 38 y.o. female.   Patient presents today for bilateral ear fullness ongoing for the past 4-5 days.  She also endorses muffled hearing and intermittent ear pain.  No ear drainage, cough, congestion, or sore throat.  Has not been able to clear her ears since the symptoms started.  No recent underwater head immersion.  She has history of allergies but is unable to take oral antihistamines because "they make me too dry."  She reports has taken allergy nasal sprays in the past but "I do not prefer them."  Reports history of frequent ear infections as a child.    Past Medical History:  Diagnosis Date   Abnormal uterine bleeding (AUB) 01/19/2015   Chest pain    Complication of anesthesia    difficult to wake up after anesthesia   Depression    Dysrhythmia    Family history of adverse reaction to anesthesia    Fibrocystic breast changes    Hypertension    Hyperthyroidism    IIH (idiopathic intracranial hypertension)    Migraines    Nexplanon  in place 01/19/2015   Nexplanon  insertion 11/29/2012   inserted nexplanon  11/29/12 in left arm, remove 11/30/15   Obesity    Preterm delivery    Seasonal rhinitis    Tachycardia    UTI (lower urinary tract infection)     Patient Active Problem List   Diagnosis Date Noted   Mass of upper outer quadrant of right breast 05/04/2023   Breast discharge 05/04/2023   Mass of upper outer quadrant of left breast 01/12/2023   Family history of breast cancer in sister 01/12/2023   IIH (idiopathic intracranial hypertension) 11/08/2021   Irregular bleeding 02/12/2021   Encounter for sterilization 02/12/2021   Breast pain, left 05/08/2020   Discharge from left nipple 05/08/2020   Enlarged thyroid  gland 01/04/2019   Feeling tired 11/14/2016   Tachycardia  07/01/2015   Tibial plateau fracture    Abnormal uterine bleeding (AUB) 01/19/2015   Nexplanon  removal 01/19/2015   Breakthrough bleeding on Nexplanon  06/13/2013   Nexplanon  insertion 11/29/2012   OBESITY 10/31/2007   DEPRESSION 10/31/2007   ALLERGIC RHINITIS, SEASONAL 10/31/2007   FIBROCYSTIC BREAST DISEASE 10/31/2007    Past Surgical History:  Procedure Laterality Date   CHOLECYSTECTOMY     DILITATION & CURRETTAGE/HYSTROSCOPY WITH HYDROTHERMAL ABLATION N/A 01/04/2022   Procedure: DILATATION & CURETTAGE/HYSTEROSCOPY WITH HYDROTHERMAL ABLATION;  Surgeon: Ozan, Jennifer, DO;  Location: [facility];  Service: Gynecology;  Laterality: N/A;   LAPAROSCOPIC BILATERAL SALPINGECTOMY Bilateral 01/04/2022   Procedure: LAPAROSCOPIC BILATERAL SALPINGECTOMY;  Surgeon: Ozan, Jennifer, DO;  Location: [facility];  Service: Gynecology;  Laterality: Bilateral;   ORIF TIBIA PLATEAU Left 07/01/2015   Procedure: OPEN REDUCTION INTERNAL FIXATION (ORIF) TIBIAL PLATEAU AND BONE GRAFT LEFT TIBIAL PLATEAU;  Surgeon: Darrin Emerald, MD;  Location: [facility];  Service: Orthopedics;  Laterality: Left;   REMOVAL OF NON VAGINAL CONTRACEPTIVE DEVICE Left 01/04/2022   Procedure: REMOVAL OF NEXPLANON  IMPLANT;  Surgeon: Ozan, Jennifer, DO;  Location: [facility];  Service: Gynecology;  Laterality: Left;   TONSILLECTOMY      OB History     Gravida  3   Para  2   Term      Preterm  2  AB  1   Living  2      SAB  1   IAB      Ectopic      Multiple      Live Births  2            Home Medications    Prior to Admission medications   Medication Sig Start Date End Date Taking? Authorizing Provider  busPIRone (BUSPAR) 10 MG tablet Take 10 mg by mouth as needed. 12/28/22   [provider]  Carboxymethylcell-Glycerin  PF (REFRESH RELIEVA PF) 0.5-1 % SOLN Place 1 drop into both eyes 4 (four) times daily.    [provider]  Multiple Vitamin (MULTIVITAMIN WITH MINERALS) TABS tablet Take 1 tablet by  mouth daily.    [provider]    Family History Family History  Problem Relation Age of Onset   Heart disease Paternal Grandfather    Diabetes Paternal Grandfather    Hypertension Paternal Grandfather    Diabetes Paternal Grandmother    Hypertension Paternal Grandmother    Hypertension Maternal Grandmother    Hypertension Maternal Grandfather    Hypertension Father    Hypertension Mother    Hodgkin's lymphoma Mother    Breast cancer Sister     Social History Social History   Tobacco Use   Smoking status: Never   Smokeless tobacco: Never  Vaping Use   Vaping status: Never Used  Substance Use Topics   Alcohol use: No   Drug use: No     Allergies   Hydrocodone , Shellfish allergy, Codeine, and Dilaudid [hydromorphone hcl]   Review of Systems Review of Systems Per HPI  Physical Exam Triage Vital Signs ED Triage Vitals  Encounter Vitals Group     BP 08/08/23 1634 119/72     Systolic BP Percentile --      Diastolic BP Percentile --      Pulse Rate 08/08/23 1634 94     Resp 08/08/23 1634 16     Temp 08/08/23 1634 97.9 F (36.6 C)     Temp Source 08/08/23 1634 Oral     SpO2 08/08/23 1634 98 %     Weight --      Height --      Head Circumference --      Peak Flow --      Pain Score 08/08/23 1637 4     Pain Loc --      Pain Education --      Exclude from Growth Chart --    No data found.  Updated Vital Signs BP 119/72 (BP Location: Right Arm)   Pulse 94   Temp 97.9 F (36.6 C) (Oral)   Resp 16   SpO2 98%   Visual Acuity Right Eye Distance:   Left Eye Distance:   Bilateral Distance:    Right Eye Near:   Left Eye Near:    Bilateral Near:     Physical Exam Vitals and nursing note reviewed.  Constitutional:      General: She is not in acute distress.    Appearance: Normal appearance. She is not ill-appearing or toxic-appearing.  HENT:     Head: Normocephalic and atraumatic.     Right Ear: Ear canal and external ear normal. A middle  ear effusion is present. Tympanic membrane is scarred.     Left Ear: Ear canal and external ear normal. A middle ear effusion is present. Tympanic membrane is scarred.     Nose: Congestion present. No  rhinorrhea.     Mouth/Throat:     Mouth: Mucous membranes are moist.     Pharynx: Oropharynx is clear. Posterior oropharyngeal erythema present. No oropharyngeal exudate.  Eyes:     General: No scleral icterus.    Extraocular Movements: Extraocular movements intact.  Cardiovascular:     Rate and Rhythm: Normal rate and regular rhythm.  Pulmonary:     Effort: Pulmonary effort is normal. No respiratory distress.  Musculoskeletal:     Cervical back: Normal range of motion and neck supple.  Lymphadenopathy:     Cervical: No cervical adenopathy.  Skin:    General: Skin is warm and dry.     Coloration: Skin is not jaundiced or pale.     Findings: No erythema or rash.  Neurological:     Mental Status: She is alert and oriented to person, place, and time.  Psychiatric:        Behavior: Behavior is cooperative.      UC Treatments / Results  Labs (all labs ordered are listed, but only abnormal results are displayed) Labs Reviewed - No data to display  EKG   Radiology No results found.  Procedures Procedures (including critical care time)  Medications Ordered in UC Medications - No data to display  Initial Impression / Assessment and Plan / UC Course  I have reviewed the triage vital signs and the nursing notes.  Pertinent labs & imaging results that were available during my care of the patient were reviewed by me and considered in my medical decision making (see chart for details).   Patient is well-appearing, normotensive, afebrile, not tachycardic, not tachypneic, oxygenating well on room air.    1. Eustachian tube dysfunction, bilateral Recommended 3 day or less course of Sudafed and with no improvement, start daily Flonase Return and ER precautions discussed with patient    The patient was given the opportunity to ask questions.  All questions answered to their satisfaction.  The patient is in agreement to this plan.   Final Clinical Impressions(s) / UC Diagnoses   Final diagnoses:  Eustachian tube dysfunction, bilateral     Discharge Instructions      There is a little bit of fluid behind both of your ear drums.  You can try taking Sudafed for a couple of days to see if this will help dry up the fluid.  Do not take this more than 3 days.  You can also start Flonase nasal spray to help dry up the fluid.  Seek care if symptoms persist or worsen despite treatment.    ED Prescriptions   None    PDMP not reviewed this encounter.   Wilhemena Harbour, NP 08/08/23 [phone]

## 2023-08-08 NOTE — Discharge Instructions (Signed)
 There is a little bit of fluid behind both of your ear drums.  You can try taking Sudafed for a couple of days to see if this will help dry up the fluid.  Do not take this more than 3 days.  You can also start Flonase nasal spray to help dry up the fluid.  Seek care if symptoms persist or worsen despite treatment.

## 2023-11-06 ENCOUNTER — Telehealth: Payer: Self-pay | Admitting: Dietician

## 2023-11-06 ENCOUNTER — Ambulatory Visit: Admitting: Dietician

## 2023-11-06 NOTE — Telephone Encounter (Signed)
 Pt scheduled for Pre-op Class this morning. Called 15 after pt was to arrive to see if patient was coming. No answer. Let voice message with return phone number. Pt was called Friday (8/1) and voice message left as a reminder of class this morning.

## 2023-11-09 ENCOUNTER — Other Ambulatory Visit: Payer: Self-pay

## 2023-11-09 ENCOUNTER — Emergency Department (HOSPITAL_COMMUNITY)
Admission: EM | Admit: 2023-11-09 | Discharge: 2023-11-09 | Disposition: A | Discharge status: Home / Self Care | Attending: Emergency Medicine | Admitting: Emergency Medicine

## 2023-11-09 ENCOUNTER — Emergency Department (HOSPITAL_COMMUNITY)

## 2023-11-09 ENCOUNTER — Encounter (HOSPITAL_COMMUNITY): Payer: Self-pay

## 2023-11-09 DIAGNOSIS — R0789 Other chest pain: Secondary | ICD-10-CM | POA: Diagnosis present

## 2023-11-09 LAB — CBC WITH DIFFERENTIAL/PLATELET
Abs Immature Granulocytes: 0.02 K/uL (ref 0.00–0.07)
Basophils Absolute: 0 K/uL (ref 0.0–0.1)
Basophils Relative: 1 %
Eosinophils Absolute: 0.3 K/uL (ref 0.0–0.5)
Eosinophils Relative: 4 %
HCT: 41 % (ref 36.0–46.0)
Hemoglobin: 14 g/dL (ref 12.0–15.0)
Immature Granulocytes: 0 %
Lymphocytes Relative: 32 %
Lymphs Abs: 2.6 K/uL (ref 0.7–4.0)
MCH: 31.5 pg (ref 26.0–34.0)
MCHC: 34.1 g/dL (ref 30.0–36.0)
MCV: 92.3 fL (ref 80.0–100.0)
Monocytes Absolute: 0.7 K/uL (ref 0.1–1.0)
Monocytes Relative: 8 %
Neutro Abs: 4.7 K/uL (ref 1.7–7.7)
Neutrophils Relative %: 55 %
Platelets: 324 K/uL (ref 150–400)
RBC: 4.44 MIL/uL (ref 3.87–5.11)
RDW: 12 % (ref 11.5–15.5)
WBC: 8.4 K/uL (ref 4.0–10.5)
nRBC: 0 % (ref 0.0–0.2)

## 2023-11-09 LAB — COMPREHENSIVE METABOLIC PANEL WITH GFR
ALT: 24 U/L (ref 0–44)
AST: 19 U/L (ref 15–41)
Albumin: 4.1 g/dL (ref 3.5–5.0)
Alkaline Phosphatase: 59 U/L (ref 38–126)
Anion gap: 11 (ref 5–15)
BUN: 11 mg/dL (ref 6–20)
CO2: 26 mmol/L (ref 22–32)
Calcium: 9.4 mg/dL (ref 8.9–10.3)
Chloride: 103 mmol/L (ref 98–111)
Creatinine, Ser: 0.66 mg/dL (ref 0.44–1.00)
GFR, Estimated: >60 mL/min (ref >60)
Glucose, Bld: 110 mg/dL — ABNORMAL HIGH (ref 70–99)
Potassium: 3.6 mmol/L (ref 3.5–5.1)
Sodium: 140 mmol/L (ref 135–145)
Total Bilirubin: 0.6 mg/dL (ref 0.0–1.2)
Total Protein: 7.9 g/dL (ref 6.5–8.1)

## 2023-11-09 LAB — D-DIMER, QUANTITATIVE: D-Dimer, Quant: 0.87 ug{FEU}/mL — ABNORMAL HIGH (ref 0.00–0.50)

## 2023-11-09 LAB — TROPONIN I (HIGH SENSITIVITY): Troponin I (High Sensitivity): <2 ng/L (ref <18)

## 2023-11-09 MED ORDER — KETOROLAC TROMETHAMINE 15 MG/ML IJ SOLN
15.0000 mg | Freq: Once | INTRAMUSCULAR | Status: AC
  Administered 2023-11-09: 15 mg via INTRAVENOUS
  Filled 2023-11-09: qty 1

## 2023-11-09 MED ORDER — IOHEXOL 350 MG/ML SOLN
75.0000 mL | Freq: Once | INTRAVENOUS | Status: AC | PRN
  Administered 2023-11-09: 75 mL via INTRAVENOUS

## 2023-11-09 MED ORDER — ALUM & MAG HYDROXIDE-SIMETH 200-200-20 MG/5ML PO SUSP
30.0000 mL | Freq: Once | ORAL | Status: AC
  Administered 2023-11-09: 30 mL via ORAL
  Filled 2023-11-09: qty 30

## 2023-11-09 MED ORDER — ACETAMINOPHEN 500 MG PO TABS
1000.0000 mg | ORAL_TABLET | Freq: Once | ORAL | Status: AC
  Administered 2023-11-09: 1000 mg via ORAL
  Filled 2023-11-09: qty 2

## 2023-11-09 MED ORDER — NAPROXEN 375 MG PO TABS: 375.0000 mg | ORAL_TABLET | Freq: Two times a day (BID) | ORAL | 0 refills | Status: DC

## 2023-11-09 MED ORDER — PANTOPRAZOLE SODIUM 20 MG PO TBEC: 20.0000 mg | DELAYED_RELEASE_TABLET | Freq: Every day | ORAL | 0 refills | Status: DC

## 2023-11-09 NOTE — ED Provider Notes (Signed)
 [place] [facility] AT [facility] Provider Note   CSN: [id number] Arrival date & time: 11/09/23  1706     Patient presents with: Chest Pain   [name] is a 38 y.o. female.  Notes ER today for evaluation of left upper chest pain ongoing for 3 days.  Has been constant.  Radiates to both shoulders and her neck.  She she does feel somewhat short of breath but denies any pleuritic pain.  No hemoptysis.  No lower extremity swelling or tenderness.  No history of VTE and no hormone use.  No recent injury or trauma.  She denies any sweating, pain is not worse with exertion and it is not positional.  She states she is also having acid reflux symptoms and tried Pepto-Bismol without relief.    Chest Pain      Prior to Admission medications   Medication Sig Start Date End Date Taking? Authorizing Provider  naproxen  (NAPROSYN ) 375 MG tablet Take 1 tablet (375 mg total) by mouth 2 (two) times daily. 11/09/23  Yes Adolphe Fortunato A, PA-C  pantoprazole  (PROTONIX ) 20 MG tablet Take 1 tablet (20 mg total) by mouth daily. 11/09/23  Yes Jahmiyah Dullea A, PA-C  ALPRAZolam (XANAX) 0.25 MG tablet Take 0.25 mg by mouth daily as needed for anxiety. 06/22/23   [provider]  busPIRone (BUSPAR) 10 MG tablet Take 10 mg by mouth as needed. 12/28/22   [provider]  Carboxymethylcell-Glycerin  PF (REFRESH RELIEVA PF) 0.5-1 % SOLN Place 1 drop into both eyes 4 (four) times daily.    [provider]  Multiple Vitamin (MULTIVITAMIN WITH MINERALS) TABS tablet Take 1 tablet by mouth daily.    [provider]  omeprazole (PRILOSEC) 20 MG capsule Take 20 mg by mouth daily. 09/19/23   [provider]    Allergies: Shellfish allergy, Hydrocodone , Codeine, and Dilaudid [hydromorphone hcl]    Review of Systems  Cardiovascular:  Positive for chest pain.    Updated Vital Signs BP 113/78   Pulse 78   Resp 19   Ht 5' 3 (1.6 m)   Wt 113.7 kg   SpO2  96%   BMI 44.40 kg/m   Physical Exam Vitals and nursing note reviewed.  Constitutional:      General: She is not in acute distress.    Appearance: She is well-developed.  HENT:     Head: Normocephalic and atraumatic.  Eyes:     Conjunctiva/sclera: Conjunctivae normal.  Cardiovascular:     Rate and Rhythm: Normal rate and regular rhythm.     Heart sounds: No murmur heard. Pulmonary:     Effort: Pulmonary effort is normal. No respiratory distress.     Breath sounds: Normal breath sounds.  Abdominal:     Palpations: Abdomen is soft.     Tenderness: There is no abdominal tenderness.  Musculoskeletal:        General: No swelling.     Cervical back: Neck supple.     Right lower leg: No tenderness. No edema.     Left lower leg: No tenderness. No edema.  Skin:    General: Skin is warm and dry.     Capillary Refill: Capillary refill takes less than 2 seconds.  Neurological:     General: No focal deficit present.     Mental Status: She is alert and oriented to person, place, and time.  Psychiatric:        Mood and Affect: Mood normal.     (  all labs ordered are listed, but only abnormal results are displayed) Labs Reviewed  COMPREHENSIVE METABOLIC PANEL WITH GFR - Abnormal; Notable for the following components:      Result Value   Glucose, Bld 110 (*)    All other components within normal limits  D-DIMER, QUANTITATIVE - Abnormal; Notable for the following components:   D-Dimer, Quant 0.87 (*)    All other components within normal limits  CBC WITH DIFFERENTIAL/PLATELET  TROPONIN I (HIGH SENSITIVITY)    EKG: EKG Interpretation Date/Time:  Thursday November 09 2023 17:18:40 EDT Ventricular Rate:  96 PR Interval:  146 QRS Duration:  80 QT Interval:  352 QTC Calculation: 444 R Axis:   59  Text Interpretation: Normal sinus rhythm Normal ECG When compared with ECG of 09-Dec-2022 11:10, Vent. rate has increased BY  32 BPM Confirmed by Cleotilde Rogue ([id number]) on 11/09/2023 5:31:05  PM  Radiology: DG Chest 2 View Result Date: 11/09/2023 CLINICAL DATA:  Chest pain. EXAM: CHEST - 2 VIEW COMPARISON:  12/09/2022. FINDINGS: Bilateral lung fields are clear. Bilateral costophrenic angles are clear. Normal cardio-mediastinal silhouette. No acute osseous abnormalities. The soft tissues are within normal limits. IMPRESSION: No active cardiopulmonary disease. Electronically Signed   By: Ree Molt M.D.   On: 11/09/2023 17:50     Procedures   Medications Ordered in the ED  alum & mag hydroxide-simeth (MAALOX/MYLANTA) 200-200-20 MG/5ML suspension 30 mL (30 mLs Oral Given 11/09/23 1757)  ketorolac  (TORADOL ) 15 MG/ML injection 15 mg (15 mg Intravenous Given 11/09/23 1758)  acetaminophen  (TYLENOL ) tablet 1,000 mg (1,000 mg Oral Given 11/09/23 1850)  iohexol  (OMNIPAQUE ) 350 MG/ML injection 75 mL (75 mLs Intravenous Contrast Given 11/09/23 1927)                                    Medical Decision Making This patient presents to the ED for concern of chest pain x 3 days, this involves an extensive number of treatment options, and is a complaint that carries with it a high risk of complications and morbidity.  The differential diagnosis includes ACS, PE, pneumonia, pericarditis, pneumothorax, other   Co morbidities that complicate the patient evaluation :   Obesity, IIH   Additional history obtained:  Additional history obtained from EMR External records from outside source obtained and reviewed including prior notes and labs   Lab Tests:  I Ordered, and personally interpreted labs.  The pertinent results include: Mono negative, CBC normal, CMP normal, D-dimer elevated at 0.87   Imaging Studies ordered:  I ordered imaging studies including x-ray which shows no pulmonary edema or infiltrate I independently visualized and interpreted imaging within scope of identifying emergent findings  I agree with the radiologist interpretation   Cardiac Monitoring: / EKG:  The patient  was maintained on a cardiac monitor.  I personally viewed and interpreted the cardiac monitored which showed an underlying rhythm of: Sinus rhythm     Problem List / ED Course / Critical interventions / Medication management  Patient here with 3 days of constant left upper chest pain with intermittent GERD as well.  She has been trying Tylenol  and Pepto-Bismol without relief.  Pain is not exertional is not associated with nausea or sweating but she states she does have radiation to her neck bilaterally and into her back.  No calf swelling or tenderness she is not on hormones.  Low risk on Wells criteria for PE, but she  is having shortness of breath as well so given her otherwise reassuring workup noted D-dimer which is unfortunately positive so we will pursue PE study.  If negative will plan on discharge with NSAIDs and PPI and have her follow-up with her PCP.  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 Radiology: ordered.  Risk OTC drugs. Prescription drug management.        Final diagnoses:  Atypical chest pain    ED Discharge Orders          Ordered    pantoprazole  (PROTONIX ) 20 MG tablet  Daily        11/09/23 1927    naproxen  (NAPROSYN ) 375 MG tablet  2 times daily        11/09/23 [address] 11/09/23 GENNIE Cleotilde Rogue, MD 11/10/23 1726

## 2023-11-09 NOTE — ED Notes (Signed)
 Pt transported to CT ?

## 2023-11-09 NOTE — Discharge Instructions (Addendum)
 Your lab tests, EKG and imaging studies today are all negative with no obvious source of symptoms.  This suggests that your symptoms are possibly musculoskeletal but you are also being covered with a proton pump inhibitor in case your symptoms are related to acid reflux.  Please come follow-up with your primary doctor for recheck of your symptoms, call in the morning for an appointment.

## 2023-11-09 NOTE — ED Triage Notes (Signed)
 Pt arrived via POV c/o chest pain that has been persistent for 3 days. Pt reports pain radiates to her bilateral neck and shoulders.

## 2023-11-09 NOTE — ED Provider Notes (Signed)
 Patient signed out to me from [facility], [place] pending CTA chest to rule out PE.  CT is negative, other labs are reassuring including CBC, c-Met, troponin.  Will treat for Atypical chest pain, plan follow-up with primary provider.  PPI also started.   Results for orders placed or performed during the hospital encounter of 11/09/23  CBC with Differential   Collection Time: 11/09/23  5:31 PM  Result Value Ref Range   WBC 8.4 4.0 - 10.5 K/uL   RBC 4.44 3.87 - 5.11 MIL/uL   Hemoglobin 14.0 12.0 - 15.0 g/dL   HCT 58.9 63.9 - 53.9 %   MCV 92.3 80.0 - 100.0 fL   MCH 31.5 26.0 - 34.0 pg   MCHC 34.1 30.0 - 36.0 g/dL   RDW 87.9 88.4 - 84.4 %   Platelets 324 150 - 400 K/uL   nRBC 0.0 0.0 - 0.2 %   Neutrophils Relative % 55 %   Neutro Abs 4.7 1.7 - 7.7 K/uL   Lymphocytes Relative 32 %   Lymphs Abs 2.6 0.7 - 4.0 K/uL   Monocytes Relative 8 %   Monocytes Absolute 0.7 0.1 - 1.0 K/uL   Eosinophils Relative 4 %   Eosinophils Absolute 0.3 0.0 - 0.5 K/uL   Basophils Relative 1 %   Basophils Absolute 0.0 0.0 - 0.1 K/uL   Immature Granulocytes 0 %   Abs Immature Granulocytes 0.02 0.00 - 0.07 K/uL  Comprehensive metabolic panel   Collection Time: 11/09/23  5:31 PM  Result Value Ref Range   Sodium 140 135 - 145 mmol/L   Potassium 3.6 3.5 - 5.1 mmol/L   Chloride 103 98 - 111 mmol/L   CO2 26 22 - 32 mmol/L   Glucose, Bld 110 (H) 70 - 99 mg/dL   BUN 11 6 - 20 mg/dL   Creatinine, Ser 9.33 0.44 - 1.00 mg/dL   Calcium  9.4 8.9 - 10.3 mg/dL   Total Protein 7.9 6.5 - 8.1 g/dL   Albumin 4.1 3.5 - 5.0 g/dL   AST 19 15 - 41 U/L   ALT 24 0 - 44 U/L   Alkaline Phosphatase 59 38 - 126 U/L   Total Bilirubin 0.6 0.0 - 1.2 mg/dL   GFR, Estimated >39 >39 mL/min   Anion gap 11 5 - 15  D-dimer, quantitative   Collection Time: 11/09/23  5:31 PM  Result Value Ref Range   D-Dimer, Quant 0.87 (H) 0.00 - 0.50 ug/mL-FEU  Troponin I (High Sensitivity)   Collection Time: 11/09/23  5:31 PM  Result Value Ref  Range   Troponin I (High Sensitivity) <2 <18 ng/L   CT Angio Chest PE W and/or Wo Contrast Result Date: 11/09/2023 CLINICAL DATA:  Positive D-dimer, chest pain for 3 days, bilateral neck and shoulder pain EXAM: CT ANGIOGRAPHY CHEST WITH CONTRAST TECHNIQUE: Multidetector CT imaging of the chest was performed using the standard protocol during bolus administration of intravenous contrast. Multiplanar CT image reconstructions and MIPs were obtained to evaluate the vascular anatomy. RADIATION DOSE REDUCTION: This exam was performed according to the departmental dose-optimization program which includes automated exposure control, adjustment of the mA and/or kV according to patient size and/or use of iterative reconstruction technique. CONTRAST:  75mL OMNIPAQUE  IOHEXOL  350 MG/ML SOLN COMPARISON:  12/31/2019 FINDINGS: Cardiovascular: This is a technically adequate evaluation of the pulmonary vasculature. No filling defects or pulmonary emboli. The heart is unremarkable without pericardial effusion. No evidence of thoracic aortic aneurysm or dissection. Mediastinum/Nodes: No enlarged mediastinal, hilar,  or axillary lymph nodes. Thyroid  gland, trachea, and esophagus demonstrate no significant findings. Lungs/Pleura: No acute airspace disease, effusion, or pneumothorax. The central airways are patent. Upper Abdomen: No acute abnormality. Musculoskeletal: No acute or destructive bony abnormalities. Reconstructed images demonstrate no additional findings. Review of the MIP images confirms the above findings. IMPRESSION: 1. No evidence of pulmonary embolus. 2. No acute intrathoracic process. Electronically Signed   By: Ozell Daring M.D.   On: 11/09/2023 19:43   DG Chest 2 View Result Date: 11/09/2023 CLINICAL DATA:  Chest pain. EXAM: CHEST - 2 VIEW COMPARISON:  12/09/2022. FINDINGS: Bilateral lung fields are clear. Bilateral costophrenic angles are clear. Normal cardio-mediastinal silhouette. No acute osseous  abnormalities. The soft tissues are within normal limits. IMPRESSION: No active cardiopulmonary disease. Electronically Signed   By: Ree Molt M.D.   On: 11/09/2023 17:50      [name] 11/09/23 Nicole Cleotilde Rogue, MD 11/10/23 1726

## 2023-11-09 NOTE — ED Notes (Signed)
 ED Provider at bedside.

## 2024-03-19 ENCOUNTER — Encounter: Payer: Self-pay | Admitting: Adult Health

## 2024-03-19 ENCOUNTER — Other Ambulatory Visit (HOSPITAL_COMMUNITY)
Admission: RE | Admit: 2024-03-19 | Discharge: 2024-03-19 | Disposition: A | Source: Ambulatory Visit | Attending: Adult Health | Admitting: Adult Health

## 2024-03-19 ENCOUNTER — Ambulatory Visit: Payer: PRIVATE HEALTH INSURANCE | Admitting: Adult Health

## 2024-03-19 VITALS — BP 125/89 | HR 101 | Ht 63.0 in | Wt 264.0 lb

## 2024-03-19 DIAGNOSIS — R102 Pelvic and perineal pain unspecified side: Secondary | ICD-10-CM | POA: Insufficient documentation

## 2024-03-19 DIAGNOSIS — Z01419 Encounter for gynecological examination (general) (routine) without abnormal findings: Secondary | ICD-10-CM | POA: Insufficient documentation

## 2024-03-19 DIAGNOSIS — R110 Nausea: Secondary | ICD-10-CM | POA: Diagnosis not present

## 2024-03-19 DIAGNOSIS — F418 Other specified anxiety disorders: Secondary | ICD-10-CM | POA: Diagnosis not present

## 2024-03-19 DIAGNOSIS — F419 Anxiety disorder, unspecified: Secondary | ICD-10-CM | POA: Insufficient documentation

## 2024-03-19 DIAGNOSIS — Z1151 Encounter for screening for human papillomavirus (HPV): Secondary | ICD-10-CM | POA: Diagnosis not present

## 2024-03-19 DIAGNOSIS — R350 Frequency of micturition: Secondary | ICD-10-CM | POA: Diagnosis not present

## 2024-03-19 DIAGNOSIS — Z113 Encounter for screening for infections with a predominantly sexual mode of transmission: Secondary | ICD-10-CM | POA: Insufficient documentation

## 2024-03-19 DIAGNOSIS — R21 Rash and other nonspecific skin eruption: Secondary | ICD-10-CM | POA: Diagnosis not present

## 2024-03-19 LAB — POCT URINALYSIS DIPSTICK
Glucose, UA: NEGATIVE
Ketones, UA: NEGATIVE
Nitrite, UA: NEGATIVE
Protein, UA: NEGATIVE

## 2024-03-19 MED ORDER — SULFAMETHOXAZOLE-TRIMETHOPRIM 800-160 MG PO TABS: 1.0000 | ORAL_TABLET | Freq: Two times a day (BID) | ORAL | 0 refills | Status: AC

## 2024-03-19 MED ORDER — ONDANSETRON HCL 4 MG PO TABS: 4.0000 mg | ORAL_TABLET | Freq: Three times a day (TID) | ORAL | 1 refills | Status: AC | PRN

## 2024-03-19 NOTE — Progress Notes (Signed)
 Patient ID: [name], female   DOB: September 24, 1985, 38 y.o.   MRN: [id number] History of Present Illness: [name] is a 38 year old white female, married, [id number], in for a well woman gyn exam and pap. She is complaining of urinary frequency and pressure, has some nausea and has noticed vaginal odor(had switched body wash) Has red rash over arms, legs and trunk that has started in last week or so, no itching. 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 Has headaches at times too. She says she has BM about every 2 weeks, will take dulcolax if needed.  PCP is [facility].  Current Medications, Allergies, Past Medical History, Past Surgical History, Family History and Social History were reviewed in [employer] record.     Review of Systems: Patient denies any hearing loss, fatigue, blurred vision, shortness of breath, chest pain, abdominal pain, problems with  intercourse(not active in about 6 months, husband cheated). No joint pain or mood swings.  See HPI for positives.   Physical Exam:BP 125/89 (BP Location: Left Arm, Patient Position: Sitting, Cuff Size: Large)   Pulse (!) 101   Ht 5' 3 (1.6 m)   Wt 264 lb (119.7 kg)   LMP 03/12/2024 (Approximate)   BMI 46.77 kg/m  urine  dipstick is trace blood and leuks  General:  Well developed, well nourished, no acute distress Skin:  Warm and dry, has red, non scaly or raised rash, over body Neck:  Midline trachea, slightly  enlarged thyroid , good ROM, no lymphadenopathy Lungs; Clear to auscultation bilaterally Breast:  No dominant palpable mass, retraction, or nipple discharge Cardiovascular: Regular rate and rhythm Abdomen:  Soft, non tender, no hepatosplenomegaly Pelvic:  External genitalia is normal in appearance, no lesions.  The vagina is normal in appearance. Urethra has no lesions or masses. The cervix is bulbous.Pap with GC/CHL and HR HPV genotyping performed.   Uterus is felt to be  normal size, shape, and contour.  No adnexal masses or tenderness noted.Bladder is mildly tender, no masses felt. Extremities/musculoskeletal:  No swelling or varicosities noted, no clubbing or cyanosis Psych:  No mood changes, alert and cooperative,seems happy AA is 1 Fall risk is low    03/19/2024   11:54 AM 02/14/2023    9:57 AM 02/12/2021   11:26 AM  Depression screen PHQ 2/9  Decreased Interest 1 1 0  Down, Depressed, Hopeless 1 1 0  PHQ - 2 Score 2 2 0  Altered sleeping 1 1 1   Tired, decreased energy 3 2 2   Change in appetite 3 1 2   Feeling bad or failure about yourself  3 1 2   Trouble concentrating 1 1 0  Moving slowly or fidgety/restless 0 0 0  Suicidal thoughts 0 0 0  PHQ-9 Score 13 8  7    Difficult doing work/chores  Somewhat difficult      Data saved with a previous flowsheet row definition       03/19/2024   11:55 AM 02/12/2021   11:26 AM  GAD 7 : Generalized Anxiety Score  Nervous, Anxious, on Edge 1 1  Control/stop worrying 2 1  Worry too much - different things 2 1  Trouble relaxing 2 1  Restless 2 0  Easily annoyed or irritable 3 0  Afraid - awful might happen 1 0  Total GAD 7 Score 13 4    Upstream - 03/19/24 1204       Pregnancy Intention Screening  Does the patient want to become pregnant in the next year? No    Does the patient's partner want to become pregnant in the next year? No    Would the patient like to discuss contraceptive options today? No      Contraception Wrap Up   Current Method Female Sterilization;Abstinence    End Method Abstinence;Female Sterilization    Contraception Counseling Provided No           Examination chaperoned by Clarita Salt LPN   Impression and Plan: 1. Encounter for gynecological examination with Papanicolaou smear of cervix (Primary) Pap sent Pap in 3 years if negative Physical in 1 year Labs with PCP Get follow up mammogram  - Cytology - PAP( [place])  2. Urinary frequency Will rx septra   ds UA C&S sent  Meds ordered this encounter  Medications   sulfamethoxazole -trimethoprim  (BACTRIM  DS) 800-160 MG tablet    Sig: Take 1 tablet by mouth 2 (two) times daily. Take 1 bid    Dispense:  14 tablet    Refill:  0    Supervising Provider:   JAYNE MINDER H [[id number]]   ondansetron  (ZOFRAN ) 4 MG tablet    Sig: Take 1 tablet (4 mg total) by mouth every 8 (eight) hours as needed.    Dispense:  30 tablet    Refill:  1    Supervising Provider:   JAYNE MINDER H [[id number]]    - POCT Urinalysis Dipstick - Urine Culture - Urinalysis, Routine w reflex microscopic  3. Rash See dermatologist   4. Nausea Will rx zofran    5. Pelvic pressure in female +pressure over bladder area   6. Screening examination for STD (sexually transmitted disease) Check HIV and RPR - HIV Antibody (routine testing w rflx) - RPR W/RFLX TO RPR TITER, TREPONEMAL AB, SCREEN AND DIAGNOSIS  7. Anxiety and depression She is on meds from PCP

## 2024-03-20 LAB — URINALYSIS, ROUTINE W REFLEX MICROSCOPIC
Bilirubin, UA: NEGATIVE
Glucose, UA: NEGATIVE
Ketones, UA: NEGATIVE
Leukocytes,UA: NEGATIVE
Nitrite, UA: NEGATIVE
Protein,UA: NEGATIVE
RBC, UA: NEGATIVE
Specific Gravity, UA: 1.023 (ref 1.005–1.030)
Urobilinogen, Ur: 1 mg/dL (ref 0.2–1.0)
pH, UA: 6.5 (ref 5.0–7.5)

## 2024-03-21 ENCOUNTER — Other Ambulatory Visit: Payer: Self-pay | Admitting: Adult Health

## 2024-03-21 ENCOUNTER — Ambulatory Visit: Payer: Self-pay | Admitting: Adult Health

## 2024-03-21 LAB — URINE CULTURE

## 2024-03-21 MED ORDER — FLUCONAZOLE 150 MG PO TABS: ORAL_TABLET | ORAL | 1 refills | Status: AC

## 2024-03-21 NOTE — Progress Notes (Signed)
 Rx diflucan.

## 2024-03-22 LAB — CYTOLOGY - PAP
Adequacy: ABSENT
Chlamydia: NEGATIVE
Comment: NEGATIVE
Comment: NEGATIVE
Comment: NORMAL
Diagnosis: NEGATIVE
High risk HPV: NEGATIVE
Neisseria Gonorrhea: NEGATIVE

## 2024-04-30 ENCOUNTER — Encounter: Payer: Self-pay | Admitting: Adult Health
